# Patient Record
Sex: Female | Born: 1975 | Race: Black or African American | Hispanic: No | Smoking: Never smoker
Health system: Southern US, Community
[De-identification: ages and names within clinical notes are randomized; demographics above are authoritative.]

## PROBLEM LIST (undated history)

## (undated) DIAGNOSIS — K5790 Diverticulosis of intestine, part unspecified, without perforation or abscess without bleeding: Secondary | ICD-10-CM

## (undated) DIAGNOSIS — E162 Hypoglycemia, unspecified: Secondary | ICD-10-CM

## (undated) DIAGNOSIS — F988 Other specified behavioral and emotional disorders with onset usually occurring in childhood and adolescence: Secondary | ICD-10-CM

## (undated) DIAGNOSIS — N8003 Adenomyosis of the uterus: Secondary | ICD-10-CM

## (undated) DIAGNOSIS — K5792 Diverticulitis of intestine, part unspecified, without perforation or abscess without bleeding: Secondary | ICD-10-CM

## (undated) DIAGNOSIS — J45909 Unspecified asthma, uncomplicated: Secondary | ICD-10-CM

## (undated) DIAGNOSIS — D649 Anemia, unspecified: Secondary | ICD-10-CM

## (undated) DIAGNOSIS — K900 Celiac disease: Secondary | ICD-10-CM

## (undated) DIAGNOSIS — N83209 Unspecified ovarian cyst, unspecified side: Secondary | ICD-10-CM

## (undated) DIAGNOSIS — E282 Polycystic ovarian syndrome: Secondary | ICD-10-CM

## (undated) DIAGNOSIS — R011 Cardiac murmur, unspecified: Secondary | ICD-10-CM

## (undated) HISTORY — DX: Unspecified asthma, uncomplicated: J45.909

## (undated) HISTORY — DX: Unspecified ovarian cyst, unspecified side: N83.209

## (undated) HISTORY — PX: NO PAST SURGERIES: SHX2092

## (undated) HISTORY — DX: Anemia, unspecified: D64.9

## (undated) HISTORY — DX: Adenomyosis of the uterus: N80.03

## (undated) HISTORY — DX: Cardiac murmur, unspecified: R01.1

## (undated) HISTORY — DX: Celiac disease: K90.0

## (undated) HISTORY — DX: Diverticulosis of intestine, part unspecified, without perforation or abscess without bleeding: K57.90

## (undated) HISTORY — DX: Other specified behavioral and emotional disorders with onset usually occurring in childhood and adolescence: F98.8

## (undated) HISTORY — DX: Diverticulitis of intestine, part unspecified, without perforation or abscess without bleeding: K57.92

## (undated) HISTORY — DX: Polycystic ovarian syndrome: E28.2

## (undated) HISTORY — DX: Hypoglycemia, unspecified: E16.2

---

## 1999-05-09 ENCOUNTER — Encounter: Payer: Self-pay | Admitting: Geriatric Medicine

## 1999-05-09 ENCOUNTER — Encounter: Admission: RE | Admit: 1999-05-09 | Discharge: 1999-05-09 | Payer: Self-pay | Admitting: Geriatric Medicine

## 2000-05-08 ENCOUNTER — Other Ambulatory Visit: Admission: RE | Admit: 2000-05-08 | Discharge: 2000-05-08 | Payer: Self-pay | Admitting: Obstetrics and Gynecology

## 2002-03-03 ENCOUNTER — Other Ambulatory Visit: Admission: RE | Admit: 2002-03-03 | Discharge: 2002-03-03 | Payer: Self-pay | Admitting: *Deleted

## 2003-04-01 ENCOUNTER — Other Ambulatory Visit: Admission: RE | Admit: 2003-04-01 | Discharge: 2003-04-01 | Payer: Self-pay | Admitting: *Deleted

## 2004-05-18 ENCOUNTER — Inpatient Hospital Stay (HOSPITAL_COMMUNITY): Admission: AD | Admit: 2004-05-18 | Discharge: 2004-05-18 | Payer: Self-pay | Admitting: Pediatrics

## 2004-05-18 ENCOUNTER — Inpatient Hospital Stay (HOSPITAL_COMMUNITY): Admission: AD | Admit: 2004-05-18 | Discharge: 2004-05-21 | Payer: Self-pay | Admitting: Obstetrics and Gynecology

## 2004-05-19 ENCOUNTER — Encounter (INDEPENDENT_AMBULATORY_CARE_PROVIDER_SITE_OTHER): Payer: Self-pay | Admitting: Specialist

## 2004-05-22 ENCOUNTER — Encounter: Admission: RE | Admit: 2004-05-22 | Discharge: 2004-06-21 | Payer: Self-pay | Admitting: *Deleted

## 2005-08-24 ENCOUNTER — Encounter: Payer: Self-pay | Admitting: Family Medicine

## 2005-08-24 LAB — CONVERTED CEMR LAB: Pap Smear: NORMAL

## 2006-02-08 ENCOUNTER — Ambulatory Visit: Payer: Self-pay | Admitting: Family Medicine

## 2006-03-05 ENCOUNTER — Ambulatory Visit: Payer: Self-pay | Admitting: Pulmonary Disease

## 2006-05-24 ENCOUNTER — Ambulatory Visit: Payer: Self-pay | Admitting: Family Medicine

## 2006-11-26 ENCOUNTER — Encounter: Payer: Self-pay | Admitting: Family Medicine

## 2006-11-26 DIAGNOSIS — E669 Obesity, unspecified: Secondary | ICD-10-CM | POA: Insufficient documentation

## 2006-11-26 DIAGNOSIS — E785 Hyperlipidemia, unspecified: Secondary | ICD-10-CM | POA: Insufficient documentation

## 2006-11-26 DIAGNOSIS — E6609 Other obesity due to excess calories: Secondary | ICD-10-CM | POA: Insufficient documentation

## 2007-01-01 ENCOUNTER — Encounter (INDEPENDENT_AMBULATORY_CARE_PROVIDER_SITE_OTHER): Payer: Self-pay | Admitting: *Deleted

## 2007-04-11 ENCOUNTER — Telehealth (INDEPENDENT_AMBULATORY_CARE_PROVIDER_SITE_OTHER): Payer: Self-pay | Admitting: *Deleted

## 2007-04-11 ENCOUNTER — Ambulatory Visit: Payer: Self-pay | Admitting: Family Medicine

## 2007-04-15 LAB — CONVERTED CEMR LAB
ALT: 11 units/L (ref 0–35)
AST: 17 units/L (ref 0–37)
Albumin: 4.4 g/dL (ref 3.5–5.2)
Alkaline Phosphatase: 68 units/L (ref 39–117)
BUN: 11 mg/dL (ref 6–23)
Bilirubin, Direct: 0.2 mg/dL (ref 0.0–0.3)
CO2: 21 meq/L (ref 19–32)
Calcium: 9.4 mg/dL (ref 8.4–10.5)
Chloride: 104 meq/L (ref 96–112)
Cholesterol: 186 mg/dL (ref 0–200)
Creatinine, Ser: 1.02 mg/dL (ref 0.40–1.20)
Glucose, Bld: 76 mg/dL (ref 70–99)
HDL: 42 mg/dL (ref >39)
Indirect Bilirubin: 0.5 mg/dL (ref 0.0–0.9)
LDL Cholesterol: 133 mg/dL — ABNORMAL HIGH (ref 0–99)
Potassium: 4.3 meq/L (ref 3.5–5.3)
Sodium: 140 meq/L (ref 135–145)
Total Bilirubin: 0.7 mg/dL (ref 0.3–1.2)
Total CHOL/HDL Ratio: 4.4
Total Protein: 8.2 g/dL (ref 6.0–8.3)
Triglycerides: 57 mg/dL (ref <150)
VLDL: 11 mg/dL (ref 0–40)

## 2010-06-01 ENCOUNTER — Ambulatory Visit (INDEPENDENT_AMBULATORY_CARE_PROVIDER_SITE_OTHER): Payer: BC Managed Care – PPO | Admitting: Family Medicine

## 2010-06-01 ENCOUNTER — Encounter: Payer: Self-pay | Admitting: Family Medicine

## 2010-06-01 DIAGNOSIS — R0602 Shortness of breath: Secondary | ICD-10-CM

## 2010-06-02 ENCOUNTER — Ambulatory Visit: Payer: BC Managed Care – PPO | Admitting: Family Medicine

## 2010-06-06 NOTE — Assessment & Plan Note (Signed)
Summary: ASTHMA   Vital Signs:  Patient profile:   35 year old female Weight:      274.50 pounds BMI:     40.98 Temp:     98.8 degrees F oral Pulse rate:   74 / minute Pulse rhythm:   regular BP sitting:   126 / 70  (left arm) Cuff size:   large  Vitals Entered By: Maudie Mercury Dance CMA Deborra Medina) (June 01, 2010 10:23 AM)  Serial Vital Signs/Assessments:                                PEF    PreRx  PostRx Time      O2 Sat  O2 Type     L/min  L/min  L/min   By                               330                   Kim Dance CMA (AAMA)  CC: ? Asthma   History of Present Illness: CC: ? abnormal breathing  Sunday when working out zumba (pushed herself) noticed was having difficulty breathing.  had shortness of breath and trouble catching breath for 2-3 days afterwards.  Today better.  Never used inhaler in past.  never dx asthma in past but has been having issues for several years on and off now.  manages with deep breathing.  SOB noticed about 15-20 min into exercise.  No wheezing noticed.  having to breath extremely hard to get breath.  No coughing.  No fevers/chills.  No sick contacts at home.  No h/o allergies.  No eczema.  seen at [facility] for similar reason months ago and given some cough syrup?  worried about asthma.  has asked to be tested in past.  family hx asthma, mom dx age 86s.  daughter has eczema and sister has alleriges, eczema.  also with shellfish allergy described as itch, nausea/vomiting, and scratch in throat like "closing" but no swelling of soft tissues.  currently trying to get pregnant.  Current Medications (verified): 1)  Prenatal/iron  Tabs (Prenatal Multivit-Min-Fe-Fa)  Allergies (verified): 1)  ! Iodine 2)  ! * Shellfish 3)  * Flax Seed  Past History:  Past Medical History: Last updated: 11/26/2006 Hyperlipidemia  Past Surgical History: Last updated: 11/26/2006                 G2, P2, NSVD 2001         Holter Monitor (-)  Social History: Last updated:  11/26/2006 Never Smoked Alcohol use-yes, 1 drink q. 2 months Drug use-no Regular exercise-no Marital Status: Married x 5-1/2 years Children: 2, one with meningitis as infant, seizures Occupation: Technical sales engineer Diet:  3 meals, veggies, no FF, (+) H2O, no soda, no coffee/tea  Review of Systems       per HPI  Physical Exam  General:  Well-developed,well-nourished,in no acute distress; alert,appropriate and cooperative throughout examination, obese Head:  Normocephalic and atraumatic without obvious abnormalities. No apparent alopecia or balding. Eyes:  PERRLA, EOMI, no injection Ears:  TMs clear bilaterally Nose:  nares clear bilaterally Mouth:  MMM, no pharyngeal erythema Neck:  No deformities, masses, or tenderness noted. Lungs:  Normal respiratory effort, chest expands symmetrically. Lungs are clear to auscultation, no crackles or wheezes. Heart:  Normal rate and regular  rhythm. S1 and S2 normal without gallop, murmur, click, rub or other extra sounds. Pulses:  2+ rad pulses Extremities:  No clubbing, cyanosis, edema, or deformity noted with normal full range of motion of all joints.   Skin:  Intact without suspicious lesions or rashes   Impression & Recommendations:  Problem # 1:  DYSPNEA (ICD-786.05) lung exam normal today, however description consistent with EIB. no personal h/o atopy but does have fmhx.   PEF a bit low for her (expected 450 L/s) provided with script for alb inh, advise use 15-30 min prior to exhertion to see if improvement in sxs dyspnea. return when feeling better for spirometry.  Problem # 2:  Preventive Health Care (ICD-V70.0) provided with epipen script for shellfish alleryg.  Complete Medication List: 1)  Prenatal/iron Tabs (Prenatal multivit-min-fe-fa) 2)  Ventolin Hfa 108 (90 Base) Mcg/act Aers (Albuterol sulfate) .... 2 puffs q6 hours as needed shortness of breath 3)  Epipen 2-pak 0.3 Mg/0.89m Devi (Epinephrine) .... Use as  directed  Patient Instructions: 1)  peak flow a bit low today. 2)  return when you are feeling well for spirometry. 3)  given albuterol inhaler to use 15 min prior to exercise - 2 puffs, one each during slow deep inhalation then hold your breath for 10 sec. 4)  call [facility] questions.   Prescriptions: EPIPEN 2-PAK 0.3 MG/0.3ML DEVI (EPINEPHRINE) use as directed  #1 x 1   Entered and Authorized by:   JRia Bush MD   Signed by:   JRia Bush MD on 06/01/2010   Method used:   Electronically to        [id number] (retail)       [address]  [id number]      Ph: [phone]      Fax: [phone]  RxID:   [id number] HFA 108 (90 BASE) MCG/ACT AERS (ALBUTEROL SULFATE) 2 puffs q6 hours as needed shortness of breath  #1 x 1   Entered and Authorized by:   JRia Bush MD   Signed by:   JRia Bush MD on 06/01/2010   Method used:   Electronically to        [id number] (retail)       [address] [place]  [id number]      Ph: [phone]      Fax: [phone]  RxID:   [id number]   Orders Added: 1)  Est. Patient Level III [[40375]   Prior Medications: Current Allergies (reviewed today): ! IODINE ! * SHELLFISH * FLAX SEED

## 2010-07-07 ENCOUNTER — Encounter: Payer: Self-pay | Admitting: Family Medicine

## 2010-07-07 ENCOUNTER — Telehealth: Payer: Self-pay | Admitting: *Deleted

## 2010-07-07 NOTE — Telephone Encounter (Signed)
Patient notified. Note placed up front for pick up

## 2010-07-07 NOTE — Telephone Encounter (Signed)
Pt is a foster parent and she needs a letter for the state stating that she was given an inhaler on 3/8 to use when she exercises.  She needs this with [place] letterhead.

## 2010-07-07 NOTE — Telephone Encounter (Signed)
Done, given to [employer].

## 2010-08-11 NOTE — Assessment & Plan Note (Signed)
[facility]                             PULMONARY OFFICE NOTE   NAME:Brittany [name], Brittany [name]                        MRN:          [id number]  DATE:03/05/2006                            DOB:          02/09/1976    HISTORY OF PRESENT ILLNESS:  Patient is very pleasant 35 year old black  female, who I have been asked to see for possible sleep apnea.  The  patient states that she has had occasional episodes during the night  where she will wake up and feel like she is choking or cannot get her  breath.  She also feels that her heart is beating fast during that time.  She also notes that she has episodes of palpitations during the day as  well.  She states that her husband has noted loud snoring, but has never  commented on pauses in her breathing during sleep.  The patient  typically goes to bed between 9 and 11, and gets up between 5 a.m. and  6:30 a.m.  She feels that she has not rested upon arising.  However,  once she gets ready for work,  she typically feels much better.  She  works as a Technical sales engineer, and denies any sleep pressure during  the day.  She has no problems with alertness, and feels that her work  efficiency and concentration are adequate.  She has no sleepiness with  driving short or long distances, and only has an occasional dozing with  TV or movies in the evenings.  Of note, she states that her weight is  about 20 pounds down from 2 years ago.   PAST MEDICAL HISTORY:  Totally unremarkable.  She has no medical issues  and takes no medications.  She has no known drug allergies.   SOCIAL HISTORY:  She has never smoked.  She is married and has 2  children.   FAMILY HISTORY:  Remarkable for no specific diseases in 1st degree  relatives.   REVIEW OF SYSTEMS:  As per History of Present Illness, also see patient  intake form documented on chart.   PHYSICAL EXAMINATION:  In general, she is a morbidly obese black female  in no acute  distress.  Blood pressure is 114/74, pulse 90, temperature  98.7.  Weight is 328 pounds.  O2 saturation on room air is 99%.  HEENT:  Pupils equal, round, reactive to light and accommodation.  Extraocular muscles are intact.  Nares are patent with mild deviation to  the left.  Oropharynx shows mild elongation of soft palate and uvula  with very mild tonsillar hypertrophy.  NECK:  Supple without JVD or lymphadenopathy.  No palpable thyromegaly.  CHEST:  Totally clear.  CARDIAC:  Reveals regular rate and rhythm, no murmurs, rubs, or gallops.  ABDOMEN:  Soft, nontender, with good bowel sounds.  GENITAL, RECTAL, BREAST:  Exams not done and not indicated.  Lower extremities are without edema.  Good pulses distally.  No calf  tenderness.  NEURO: Alert and oriented with no obvious motor deficits.   IMPRESSION:  Questionable obstructive sleep apnea.  The patient  clearly  gives a history for loud snoring and occasional choking arousals.  She  is not totally rested in the mornings whenever she arises, but reports  no significant sleep pressure or alertness issues during the day.  She  appears very alert during her visit today.  I have told her that she may  or may not have sleep apnea, and that the only way to know for sure  would be to proceed with nocturnal polysomnography.  I have explained to  her that short term sleep apnea presents quality of life issues,  however, long term there are more significant cardiovascular  consequences.  At this point in time, because she is fairly asymptomatic  during the day, I do not think that it would be unreasonable to take 6  months and start working on weight loss and see how things go.  I think  one could also make a case for going ahead and proceeding with nocturnal  polysomnography to see if she has significant sleep disordered  breathing.  I do not have a preference, as long as the patient is going  to follow up with me fairly closely, and is honest  with herself about  her daytime symptoms.   PLAN:  The patient will take 6 months and work aggressively on weight  loss.  I will see her in followup at that time.  She will pay very close  attention to her symptoms over the next 6 months, and if she feels that  this is affecting her quality of life, we will go ahead and set up  nocturnal polysomnography.  Certainly, her palpitations at night could  be from sleep disordered breathing, however, this would not be case  during the day.  If the patient follows up in 6 months, and she has made  no progress on weight loss, she is willing to undergo nocturnal  polysomnography at that time to put the issue to rest.     Kathee Delton, MD,FCCP  Electronically Signed    [facility]/MedQ  DD: 03/05/2006  DT: 03/05/2006  Job #: [id number]   cc:   Eliezer Lofts, MD

## 2010-08-11 NOTE — H&P (Signed)
NAMEKARSTYN, BIRKEY                 ACCOUNT NO.:  [id number]   MEDICAL RECORD NO.:  [id number]          PATIENT TYPE:  INP   LOCATION:  9164                          FACILITY:  [facility]   PHYSICIAN:  Diona Foley, M.D.   DATE OF BIRTH:  [birth year]   DATE OF ADMISSION:  05/18/2004  DATE OF DISCHARGE:                                HISTORY & PHYSICAL   ADMISSION DIAGNOSIS:  Term intrauterine pregnancy, in spontaneous labor.   HISTORY OF PRESENT ILLNESS:  This is a 35 year old African-American female,  gravida 1, para 0, who presented at 39-6/7 weeks' gestation with complaints  of irregular contractions.  The patient was monitored and subsequently made  cervical change and spontaneously ruptured her membranes.  The patient's  pregnancy has been complicated by chronic hypertension, but she has not  required any medication.  Workup for preeclampsia has been negative  throughout her pregnancy.  Prenatal care has been at [facility] with  [place] B. Rosana Hoes, M.D., as the primary attending.  Upon admission, the  patient denied any headaches, visual changes or epigastric pain, reported  good fetal movement, denied any lost fluid or vaginal bleeding, but  complained of irregular contractions.   PRENATAL LABORATORY DATA:  Blood type is A positive, antibody screen  negative.  RPR nonreactive.  Rubella immune.  Hepatitis B surface antigen  nonreactive.  HIV nonreactive.  Pap test within normal limits.  Gonorrhea  and Chlamydia screens negative.  One-hour Glucola normal.  Group B beta  strep positive.  The patient declined triple test.  Anatomic survey at 20  weeks was normal.  Ultrasound performed at 35 weeks showed a viable  intrauterine pregnancy, estimated fetal weight at the 22nd percentile,  normal amniotic fluid index, biophysical profile at 8/8.  The patient had  undergone twice-weekly fetal surveillance throughout the third trimester and  has all been reassuring.   PAST MEDICAL HISTORY:  1.  Obesity.  2.  Hypertension.   PAST SURGICAL HISTORY:  Remote history of abnormal Pap smear.  No sexually-  transmitted infections.   PAST OBSTETRICAL HISTORY:  Gravida 1, para 0.   SOCIAL HISTORY:  Denies tobacco, alcohol or drugs.  Is married.   FAMILY HISTORY:  No known congenital anomalies, birth defects or mental  retardation, Down syndrome or other trisomies.  There is a remote history of  spina bifida in a second cousin.   PHYSICAL EXAMINATION:  VITAL SIGNS:  She is afebrile, vital signs are  stable.  GENERAL:  She is an obese black female who is in no acute distress.  CARDIAC:  Regular rate and rhythm.  CHEST:  Lungs are clear to auscultation bilaterally.  ABDOMEN:  Gravid, soft in between contractions, nontender.  Appears AGA.  EXTREMITIES:  Trace edema in the feet.  Deep tendon reflexes 2+ bilaterally.  There is no clonus.  PELVIC:  Cervix is 3 cm on admission, progressed to 5 cm, 100% effaced, 0  station, with spontaneous rupture of membranes.  MONITORING:  Fetal heart rate tracings 130s-140s with accelerations.  Tocometer contractions irregular, every two to five minutes.  ASSESSMENT:  A 35 year old gravida 1, para 0, black female at 74 weeks'  gestation by first trimester ultrasound, in spontaneous labor.   PLAN:  1.  Admit to labor and delivery.  2.  Continuous fetal monitoring.  3.  The patient has Stadol and Phenergan while needed in early labor or      epidural when in active labor.  4.  Start penicillin for group B beta strep prophylaxis.  5.  Anticipate attempts at vaginal delivery.      JW/MEDQ  D:  05/19/2004  T:  05/19/2004  Job:  [id number]

## 2010-08-11 NOTE — Assessment & Plan Note (Signed)
[facility]                             [place] OFFICE NOTE   NAME:Brittany [name]                        MRN:          [id number]  DATE:02/08/2006                            DOB:          06-29-1975    CHIEF COMPLAINT:  64 black female, here to establish new  doctor.   HISTORY OF PRESENT ILLNESS:  Brittany [name] has not seen a physician in a while.  She comes in today with multiple concerns, including the following.  1. Heart palpitations and paroxysmal nocturnal dyspnea:  Brittany [name] states      that for the past 3 to 4 years she has been waking at night with      episodes where she feels like her heart is beating unusually and      skipping a beat, and at the same time she has difficulty catching her      breath.  She states that she notices it happening on days when she is      more stressed out during the day, and then the episodes occur at night.      She denies any daytime symptoms.  She does snore at night, but she is      unsure as to whether her husband has noticed her stopping breathing at      night.  Her mother does have PVCs, and she feels that this may be what      is going on with her.  She has never had a sleep study.  2. Obesity:  She is interested in losing weight.  She is also concerned      about high cholesterol and diabetes, given her weight and her family      history.  She would like to be set up with a nutritionist if possible      to discuss healthy eating habits.  She was using beta HCG as a weight      loss technique for about a month and a half over the summer.  She did      notice some mild weight loss, but did not think that any of her      symptoms could be secondary to this.  3. Dizziness:  She states that she has had some occasional dizziness off      and on for the past year, lasting a few seconds.  She occasionally has      a sensation that it feels like she is shaking her head back and forth     rapidly.  This has only happened about 1 to 2 times.  She just wanted      to mention it, because she was concerned that it could be related to      something.  She denies any persistent dizziness, vertigo or syncopal      events.  She denies headache, weakness or numbness.   REVIEW OF SYSTEMS:  Otherwise negative.   PAST MEDICAL HISTORY:  1. Hypercholesterolemia.  2. Obesity.   HOSPITALIZATIONS, SURGERIES, PROCEDURES:  1. G2, P2.  2. A 2001  normal Holter monitor.  3. Pap smear in June 2007, negative.   ALLERGIES:  None.   MEDICATIONS:  None.   FAMILY HISTORY:  Father alive at age 29 with hypertension, mother alive at  age 66 with PVCs but no heart disorders.  There is no family history of  heart attack before age 12 except her paternal grandfather did have a heart  attack at age 54, which he died from.  She has one brother with hypertension  and high cholesterol.  She has three sisters, one who has ulcerative  colitis.  A paternal great-aunt had uterine cancer, and a third cousin had  ovarian cancer.  There is no breast or colon cancer in the family.   SOCIAL HISTORY:  She is a Technical sales engineer.  She has been married for 5-  1/2 years, and denies domestic abuse.  She has 2 children, one who is  currently sick with seizures that may be related to the meningitis that she  had as an infant.  She does not get any regular exercise.  She eats 3 meals  a day with vegetables, and tries to avoid fast foods, soda, and does not  drink any coffee and tea.  She mainly drinks water.  No tobacco use, one  alcoholic beverage every 2 months, no drug use.   PHYSICAL EXAMINATION:  Height 68-3/4 inches, weight 325 pounds, making BMI  around 39.  Blood pressure 124/94, pulse 80, temperature 98.8.  GENERAL:  Obese female in no apparent distress.  HEENT:  PERRLA.  Extraocular muscles are intact.  Oropharynx is clear.  Tympanic membranes are clear.  Nares are clear.  No thyromegaly, no   lymphadenopathy supraclavicular or cervical.  Wide neck, small oropharynx.  CARDIOVASCULAR:  Regular rate and rhythm, no murmurs, rubs or gallops.  Normal PMI, 2+ peripheral pulses, no peripheral edema.  LUNGS:  Clear to auscultation bilaterally, no wheezes, rales or rhonchi.  ABDOMEN:  Soft, nontender, normoactive bowel sounds, no hepatosplenomegaly,  although difficult to palpate secondary to obesity.  MUSCULOSKELETAL:  Strength 5/5 in upper and lower extremities.  NEUROLOGIC:  Cranial nerves II-XII grossly intact.  Alert and oriented x3.  Reflexes 2+.  Sensation intact in upper and lower extremities.   ASSESSMENT AND PLAN:  1. Episodes of heart arrhythmia plus shortness of breath at night:  An EKG      was done that was read as normal sinus rhythm, no evidence of PVCs or      arrhythmia.  There were flipped R and T waves in V1, V2 and V3, but      this is thought to be secondary to lead placement problem.  Her      symptoms at night could be secondary to anxiety.  Given the fact that      she has a large neck, obesity and snoring at night, I do have a      significant concern for possible sleep apnea.  I would begin her      evaluation with a referral to a pulmonologist for a complete sleep      study.  If this returns as normal, we can consider repeating another      Holter monitor or considering a queen of hearts monitor for a month to      determine what heart arrhythmia she is having at night.  I did      recommend to her to avoid caffeine and to practice good sleep hygiene.  2. Obesity:  We did discuss weight loss and exercise techniques in detail.      She will be given a referral to a nutritionist as long as her insurance      company will cover this.  She is considering paying out of pocket if      they do not.  She was also given a handful of information to read over     regarding healthy eating habits, eating on the run and beginning an      exercise program.  3.  Intermittent dizziness:  She will continue to follow this.  It has only      happened occasionally, and not persistently.  Her neurologic      examination is within normal limits today.  We can consider further      workup at a further time if it continues.  4. Prevention:  She gets her Pap smears at a gynecologist.  She is likely      due for tetanus,      which I will look for in her records.  She has received a flu vaccine      this year.  Given her obesity and family history for diabetes and high      cholesterol, we will check a cholesterol panel as well as a diabetes      screen.  She will return to clinic following the above.     Eliezer Lofts, MD  Electronically Signed    AB/MedQ  DD: 02/08/2006  DT: 02/09/2006  Job #: [id number]

## 2011-12-19 ENCOUNTER — Other Ambulatory Visit: Payer: Self-pay | Admitting: Gynecology

## 2011-12-26 ENCOUNTER — Encounter: Payer: Self-pay | Admitting: Gastroenterology

## 2012-01-10 ENCOUNTER — Encounter: Payer: Self-pay | Admitting: Gastroenterology

## 2012-01-17 ENCOUNTER — Ambulatory Visit: Payer: BC Managed Care – PPO | Admitting: Gastroenterology

## 2012-02-05 ENCOUNTER — Ambulatory Visit: Payer: BC Managed Care – PPO | Admitting: Gastroenterology

## 2012-03-17 ENCOUNTER — Encounter: Payer: Self-pay | Admitting: *Deleted

## 2012-03-25 ENCOUNTER — Ambulatory Visit: Payer: BC Managed Care – PPO | Admitting: Gastroenterology

## 2012-08-07 ENCOUNTER — Other Ambulatory Visit: Payer: Self-pay | Admitting: Obstetrics and Gynecology

## 2012-08-07 DIAGNOSIS — R928 Other abnormal and inconclusive findings on diagnostic imaging of breast: Secondary | ICD-10-CM

## 2012-08-08 ENCOUNTER — Other Ambulatory Visit: Payer: Self-pay | Admitting: Gynecology

## 2012-08-08 DIAGNOSIS — R928 Other abnormal and inconclusive findings on diagnostic imaging of breast: Secondary | ICD-10-CM

## 2012-08-11 ENCOUNTER — Ambulatory Visit
Admission: RE | Admit: 2012-08-11 | Discharge: 2012-08-11 | Disposition: A | Discharge status: Home / Self Care | Payer: BC Managed Care – PPO | Source: Ambulatory Visit | Attending: Obstetrics and Gynecology | Admitting: Obstetrics and Gynecology

## 2012-08-11 DIAGNOSIS — R928 Other abnormal and inconclusive findings on diagnostic imaging of breast: Secondary | ICD-10-CM

## 2013-01-26 ENCOUNTER — Ambulatory Visit: Payer: BC Managed Care – PPO | Admitting: Family Medicine

## 2013-09-10 ENCOUNTER — Ambulatory Visit (INDEPENDENT_AMBULATORY_CARE_PROVIDER_SITE_OTHER): Payer: BC Managed Care – PPO | Admitting: Psychology

## 2013-09-10 ENCOUNTER — Ambulatory Visit (INDEPENDENT_AMBULATORY_CARE_PROVIDER_SITE_OTHER): Payer: BC Managed Care – PPO | Admitting: Family Medicine

## 2013-09-10 VITALS — BP 120/80 | HR 72 | Temp 98.5°F | Wt 288.0 lb

## 2013-09-10 DIAGNOSIS — D649 Anemia, unspecified: Secondary | ICD-10-CM

## 2013-09-10 DIAGNOSIS — D509 Iron deficiency anemia, unspecified: Secondary | ICD-10-CM | POA: Insufficient documentation

## 2013-09-10 DIAGNOSIS — F988 Other specified behavioral and emotional disorders with onset usually occurring in childhood and adolescence: Secondary | ICD-10-CM | POA: Insufficient documentation

## 2013-09-10 DIAGNOSIS — D72819 Decreased white blood cell count, unspecified: Secondary | ICD-10-CM | POA: Insufficient documentation

## 2013-09-10 DIAGNOSIS — F4323 Adjustment disorder with mixed anxiety and depressed mood: Secondary | ICD-10-CM

## 2013-09-10 LAB — CBC WITH DIFFERENTIAL/PLATELET
BASOS PCT: 0.8 % (ref 0.0–3.0)
Basophils Absolute: 0 10*3/uL (ref 0.0–0.1)
EOS PCT: 0.9 % (ref 0.0–5.0)
Eosinophils Absolute: 0 10*3/uL (ref 0.0–0.7)
HCT: 37.9 % (ref 36.0–46.0)
Hemoglobin: 12.1 g/dL (ref 12.0–15.0)
Lymphocytes Relative: 31.3 % (ref 12.0–46.0)
Lymphs Abs: 1.6 10*3/uL (ref 0.7–4.0)
MCHC: 32 g/dL (ref 30.0–36.0)
MCV: 83 fl (ref 78.0–100.0)
MONOS PCT: 5.3 % (ref 3.0–12.0)
Monocytes Absolute: 0.3 10*3/uL (ref 0.1–1.0)
NEUTROS PCT: 61.7 % (ref 43.0–77.0)
Neutro Abs: 3.1 10*3/uL (ref 1.4–7.7)
PLATELETS: 242 10*3/uL (ref 150.0–400.0)
RBC: 4.57 Mil/uL (ref 3.87–5.11)
RDW: 14.2 % (ref 11.5–15.5)
WBC: 5.1 10*3/uL (ref 4.0–10.5)

## 2013-09-10 LAB — VITAMIN B12: Vitamin B-12: 401 pg/mL (ref 211–911)

## 2013-09-10 LAB — IBC PANEL
Iron: 79 ug/dL (ref 42–145)
Saturation Ratios: 21.6 % (ref 20.0–50.0)
Transferrin: 260.9 mg/dL (ref 212.0–360.0)

## 2013-09-10 MED ORDER — EPINEPHRINE 0.3 MG/0.3ML IJ SOAJ: 0.3000 mg | Freq: Once | INTRAMUSCULAR | Status: DC

## 2013-09-10 MED ORDER — ALBUTEROL SULFATE HFA 108 (90 BASE) MCG/ACT IN AERS: 2.0000 | INHALATION_SPRAY | Freq: Four times a day (QID) | RESPIRATORY_TRACT | Status: DC | PRN

## 2013-09-10 NOTE — Assessment & Plan Note (Signed)
Likely iron def, but eval iron stores and B12, re-eval Hg. Continue prenatal vit with iron for now.

## 2013-09-10 NOTE — Assessment & Plan Note (Signed)
Referral for ADD eval and possible ADD behavoiral treatment. She is hesitant about medication.

## 2013-09-10 NOTE — Patient Instructions (Signed)
Stop at front desk for referral for ADD eval. Stop at lab on way out.

## 2013-09-10 NOTE — Progress Notes (Signed)
Pre visit review using our clinic review tool, if applicable. No additional management support is needed unless otherwise documented below in the visit note. 

## 2013-09-10 NOTE — Progress Notes (Signed)
   Subjective:    Patient ID: Brittany [name], female    DOB: 1975/09/19, 38 y.o.   MRN: [id number]  HPI  38 year old female presents for discussion of recent labs.   She has been feeling well overall except she had asthma attack 1 week ago 6/7-09/01/2013. She feels trigger was allergies from bunny. She had chest congestion at the time. She went to urgent Care given albuterol inhaler given hers was expired. Prescribed steroids but she did not take. Janean Sark is now outside and her symptoms have resolved.  07/2013. She had wbc 3.8. Hg 10.9  She has been iron def anemia in the past. Currentlly on prenatal vit only few days a week.. She has started daily. No blood in urine, no blood in stool.  She feels she has heavier periods.. changes pad every 4-6 hours, last 4-5 days. She feels somewhat tired, but not very, no weakness, rare slight dizziness lasting seconds in last 6-7 month SOb with asthma, none otherwise.   TSH, CMET, lipids: nml.    She has concerns she has ADD. No previous diagnosis., NO hyperactivity. She has noted focusing issues, completing activities. She has trouble organizing at work. In school she was always kept desk disorganized. No depression,no anxiety. No SI.         Review of Systems     Objective:   Physical Exam  Constitutional: She is oriented to person, place, and time. Vital signs are normal. She appears well-developed and well-nourished. She is cooperative.  Non-toxic appearance. She does not appear ill. No distress.  Obese appearing female in NAD  HENT:  Head: Normocephalic.  Right Ear: Hearing, tympanic membrane, external ear and ear canal normal. Tympanic membrane is not erythematous, not retracted and not bulging.  Left Ear: Hearing, tympanic membrane, external ear and ear canal normal. Tympanic membrane is not erythematous, not retracted and not bulging.  Nose: No mucosal edema or rhinorrhea. Right sinus exhibits no maxillary sinus tenderness and no  frontal sinus tenderness. Left sinus exhibits no maxillary sinus tenderness and no frontal sinus tenderness.  Mouth/Throat: Uvula is midline, oropharynx is clear and moist and mucous membranes are normal.  Eyes: Conjunctivae, EOM and lids are normal. Pupils are equal, round, and reactive to light. Lids are everted and swept, no foreign bodies found.  Neck: Trachea normal and normal range of motion. Neck supple. Carotid bruit is not present. No mass and no thyromegaly present.  Cardiovascular: Normal rate, regular rhythm, S1 normal, S2 normal, normal heart sounds, intact distal pulses and normal pulses.  Exam reveals no gallop and no friction rub.   No murmur heard. Pulmonary/Chest: Effort normal and breath sounds normal. Not tachypneic. No respiratory distress. She has no decreased breath sounds. She has no wheezes. She has no rhonchi. She has no rales.  Abdominal: Soft. Normal appearance and bowel sounds are normal. There is no tenderness.  Neurological: She is alert and oriented to person, place, and time.  Skin: Skin is warm, dry and intact. No rash noted.  Psychiatric: Her speech is normal and behavior is normal. Judgment and thought content normal. Her mood appears not anxious. Cognition and memory are normal. She does not exhibit a depressed mood.          Assessment & Plan:

## 2013-09-10 NOTE — Addendum Note (Signed)
Addended by: Ellamae Sia on: 09/10/2013 11:25 AM   Modules accepted: Orders

## 2013-09-10 NOTE — Assessment & Plan Note (Signed)
Likely due to recent viral illness. Re-eval and add diff

## 2013-09-11 ENCOUNTER — Ambulatory Visit: Payer: BC Managed Care – PPO | Admitting: Family Medicine

## 2013-09-11 ENCOUNTER — Encounter: Payer: Self-pay | Admitting: *Deleted

## 2013-10-06 ENCOUNTER — Other Ambulatory Visit: Payer: Self-pay | Admitting: Gynecology

## 2013-10-15 ENCOUNTER — Ambulatory Visit: Payer: BC Managed Care – PPO | Admitting: Psychology

## 2013-12-25 ENCOUNTER — Telehealth: Payer: Self-pay | Admitting: Family Medicine

## 2013-12-25 ENCOUNTER — Ambulatory Visit (INDEPENDENT_AMBULATORY_CARE_PROVIDER_SITE_OTHER): Payer: BC Managed Care – PPO | Admitting: Psychology

## 2013-12-25 DIAGNOSIS — F909 Attention-deficit hyperactivity disorder, unspecified type: Secondary | ICD-10-CM

## 2013-12-25 DIAGNOSIS — F411 Generalized anxiety disorder: Secondary | ICD-10-CM

## 2013-12-25 NOTE — Telephone Encounter (Signed)
Pt would like to talk to you in reference to her ADD testing and ask about which medication she should be taking. The dr at [facility] in [place] says it will be another week before he completes everything and he will send you a note.  Could you please call patient? Thank you.

## 2013-12-25 NOTE — Telephone Encounter (Signed)
Discussed options with pt in detail on treatment of ADD. She will have test results faxed to [place] today. Please bring them to me once they come through.  We will start short acting adderall and follow up in 1 month once I review test results.

## 2013-12-28 ENCOUNTER — Encounter: Payer: Self-pay | Admitting: Family Medicine

## 2013-12-28 ENCOUNTER — Telehealth: Payer: Self-pay | Admitting: Family Medicine

## 2013-12-28 ENCOUNTER — Other Ambulatory Visit: Payer: BC Managed Care – PPO

## 2013-12-28 NOTE — Telephone Encounter (Signed)
Spoke with Brittany [name].  Advised we still have not gotten office notes yet from [facility]  She ask if she could come in today and sign paperwork.  I think she is talking about the Control Substance Contract.  Advised she could come in today and see the ladies in the lab to take care of that part.

## 2013-12-28 NOTE — Telephone Encounter (Signed)
Pt calling in today, aware Dr Diona Browner is out of the office today but she said she did not hear back on Friday about signing some forms. Pt needs to come in today to sign because its her only availability. Please advise. Thank you

## 2013-12-30 NOTE — Telephone Encounter (Signed)
Pt left v/m wanting to know if Dr Diona Browner received the form about pts meds and if so does pt need to come in to fill out form.

## 2013-12-31 NOTE — Telephone Encounter (Signed)
We cannot given her two prescriptions but we can given her 1 weeks worth of fast acting then have her come in for follow up in 1 week. If not going well we can adjust dose of try longer acting med.

## 2013-12-31 NOTE — Telephone Encounter (Signed)
Brittany [name] notified that currently we are waiting on the UDS results to come back.  Patient states she is very nervous about all of this.  She is wondering if she can be given two prescriptions, one being Adderall short acting and the other being Adderall long acting to try.  Then next month let Dr. Diona Browner know which one worked best for her.  Please advise.

## 2013-12-31 NOTE — Telephone Encounter (Signed)
At this point I have received records from [facility]. I am waiting on UDS results ( she did have that done when she came in to sign forms right?) We will plan on starting adderral Xl if she is agreeable.

## 2013-12-31 NOTE — Telephone Encounter (Signed)
Left message for Brittany [name] to return my call.

## 2014-01-01 MED ORDER — AMPHETAMINE-DEXTROAMPHETAMINE 5 MG PO TABS: 5.0000 mg | ORAL_TABLET | Freq: Every day | ORAL | Status: DC

## 2014-01-01 NOTE — Telephone Encounter (Addendum)
We will send in rx for thee short acting adderall, we will start low and slowly increase as needed for effect. HAve her follow up in 1-2 weeks.

## 2014-01-01 NOTE — Telephone Encounter (Signed)
Brittany [name] notified as instructed by telephone.  Still waiting on UDS results to come back before we can prescribed the Adderall.  Brittany [name] instructed we would be in touch once the UDS results come back.  Patient states understanding.

## 2014-01-01 NOTE — Addendum Note (Signed)
Addended by: Eliezer Lofts E on: 01/01/2014 04:05 PM   Modules accepted: Orders

## 2014-01-01 NOTE — Telephone Encounter (Signed)
[facility] notified as instructed by telephone.  Prescription left up front for her to pick up this afternoon.  She will schedule her follow up when she gets her to pick up her Rx.

## 2014-01-04 ENCOUNTER — Telehealth: Payer: Self-pay

## 2014-01-04 NOTE — Telephone Encounter (Signed)
Pt left v/m requesting status of Adderall prior auth; spoke with Langley Gauss at [employer] and will fax PA to [phone].

## 2014-01-04 NOTE — Telephone Encounter (Signed)
Prior Authorization for Adderall 5 mg approved.  Effective from 12/14/2013 thru 01/04/2015.  QHUT#[id number].  Brittany [name] and [facility] notified.

## 2014-01-06 ENCOUNTER — Telehealth: Payer: Self-pay | Admitting: Family Medicine

## 2014-01-06 NOTE — Telephone Encounter (Signed)
[place] notified that the Adderall 5 mg that Dr. Diona Browner prescribed in short acting.

## 2014-01-06 NOTE — Telephone Encounter (Signed)
Pt called and wanted to know if amphetamine-dextroamphetamine (ADDERALL) 5 MG tablet was Extended Release.  Best number to call pt is [phone]

## 2014-01-12 ENCOUNTER — Encounter: Payer: Self-pay | Admitting: Family Medicine

## 2014-01-15 ENCOUNTER — Ambulatory Visit (INDEPENDENT_AMBULATORY_CARE_PROVIDER_SITE_OTHER): Payer: BC Managed Care – PPO | Admitting: Family Medicine

## 2014-01-15 ENCOUNTER — Encounter: Payer: Self-pay | Admitting: Family Medicine

## 2014-01-15 VITALS — BP 110/80 | HR 69 | Temp 98.5°F | Ht 68.0 in | Wt 283.8 lb

## 2014-01-15 DIAGNOSIS — F909 Attention-deficit hyperactivity disorder, unspecified type: Secondary | ICD-10-CM

## 2014-01-15 DIAGNOSIS — F988 Other specified behavioral and emotional disorders with onset usually occurring in childhood and adolescence: Secondary | ICD-10-CM

## 2014-01-15 DIAGNOSIS — M25561 Pain in right knee: Secondary | ICD-10-CM

## 2014-01-15 MED ORDER — DICLOFENAC SODIUM 75 MG PO TBEC: 75.0000 mg | DELAYED_RELEASE_TABLET | Freq: Two times a day (BID) | ORAL | Status: DC

## 2014-01-15 MED ORDER — AMPHETAMINE-DEXTROAMPHETAMINE 5 MG PO TABS: ORAL_TABLET | ORAL | Status: DC

## 2014-01-15 NOTE — Patient Instructions (Addendum)
Increase Adderall to 10 mg in AM and take 5 mg in afternoon around 12-2  PM.  Follow up in 1 month for ADD check.  Start diclofenac twice daily for pain and inflammation in knee.  Ice. Gentle range of motion stretches  Folow up if ot improving in 2 weeks.

## 2014-01-15 NOTE — Progress Notes (Signed)
   Subjective:    Patient ID: Brittany [name], female    DOB: June 20, 1975, 38 y.o.   MRN: [id number]  HPI  38 year old female presents for 1 month follow up ADD.  She has started short acting Adderall in last 8 days. She has noted some improved concentration, focus.  She feels like it is not helping in afternoon as it is short acting, she wants to try it in afternoon as well.  No significant SE. No unexpected weight loss. No palpitations. No insomnia. No chest pain.   Wt Readings from Last 3 Encounters:  01/15/14 283 lb 12 oz (128.708 kg)  09/10/13 288 lb (130.636 kg)  06/01/10 274 lb 8 oz (124.512 kg)   BP Readings from Last 3 Encounters:  01/15/14 110/80  09/10/13 120/80  06/01/10 126/70     Right knee pain x 24 hours. Occurred after kneeling. When stood up she could not put weight on it. Icing it, staying off leg today. No pain at rest but pain with walking and bending.  Hx of knee problems, no surgery., old basketball injury.  Review of Systems  Constitutional: Negative for fever and fatigue.  HENT: Negative for ear pain.   Eyes: Negative for pain.  Respiratory: Negative for chest tightness and shortness of breath.   Cardiovascular: Negative for chest pain, palpitations and leg swelling.  Gastrointestinal: Negative for abdominal pain.  Genitourinary: Negative for dysuria.       Objective:   Physical Exam  Constitutional: Vital signs are normal. She appears well-developed and well-nourished. She is cooperative.  Non-toxic appearance. She does not appear ill. No distress.  HENT:  Head: Normocephalic.  Right Ear: Hearing, tympanic membrane, external ear and ear canal normal. Tympanic membrane is not erythematous, not retracted and not bulging.  Left Ear: Hearing, tympanic membrane, external ear and ear canal normal. Tympanic membrane is not erythematous, not retracted and not bulging.  Nose: No mucosal edema or rhinorrhea. Right sinus exhibits no maxillary sinus  tenderness and no frontal sinus tenderness. Left sinus exhibits no maxillary sinus tenderness and no frontal sinus tenderness.  Mouth/Throat: Uvula is midline, oropharynx is clear and moist and mucous membranes are normal.  Eyes: Conjunctivae, EOM and lids are normal. Pupils are equal, round, and reactive to light. Lids are everted and swept, no foreign bodies found.  Neck: Trachea normal and normal range of motion. Neck supple. Carotid bruit is not present. No thyroid mass and no thyromegaly present.  Cardiovascular: Normal rate, regular rhythm, S1 normal, S2 normal, normal heart sounds, intact distal pulses and normal pulses.  Exam reveals no gallop and no friction rub.   No murmur heard. Pulmonary/Chest: Effort normal and breath sounds normal. No tachypnea. No respiratory distress. She has no decreased breath sounds. She has no wheezes. She has no rhonchi. She has no rales.  Abdominal: Soft. Normal appearance and bowel sounds are normal. There is no tenderness.  Musculoskeletal:       Right knee: She exhibits decreased range of motion. She exhibits no swelling, no effusion and no bony tenderness. Tenderness found. No MCL and no patellar tendon tenderness noted.  Neurological: She is alert.  Skin: Skin is warm, dry and intact. No rash noted.  Psychiatric: Her speech is normal and behavior is normal. Judgment and thought content normal. Her mood appears not anxious. Cognition and memory are normal. She does not exhibit a depressed mood.          Assessment & Plan:

## 2014-01-15 NOTE — Progress Notes (Signed)
Pre visit review using our clinic review tool, if applicable. No additional management support is needed unless otherwise documented below in the visit note. 

## 2014-01-21 ENCOUNTER — Telehealth: Payer: Self-pay

## 2014-01-21 NOTE — Telephone Encounter (Signed)
Pt takinig Adderall for 2 weeks; 01/20/14 when pt woke up during the night pt felt like rapid heart beat but actual pulse was 66 and ? some skipping of beat. Pt was not sure if feeling was due to anxiety or a true irregular beat; pts husband did not feel irreg heartbeat. Pt has hx of feeling irreg beat when stressed and pt is under a lot of stress now. 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 Pt does not have another Zumba class until 01/23/14. Pt did not do work out or take Adderall this morning. Now pt feels normal. This morning heart rate between 60-70. This not the first time pt has had these symptoms or feelings.[facility].Pt request cb. If pt condition changes or worsens prior to cb pt will call office.

## 2014-01-22 NOTE — Telephone Encounter (Signed)
Brittany [name] notified as instructed by telephone.  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  She has not taken any Adderall the last two days and her heart rate has been fine.  I advised to go back down to the Adderall 5 mg daily and then slowly increase it back to two a day.  If her symptoms continue she will  make an appointment to be evaluated.

## 2014-01-22 NOTE — Telephone Encounter (Signed)
If she had increased the adderall as instructed at last OV, have her return to the lower dose. Make appt for eval if symptoms continuing.

## 2014-02-05 ENCOUNTER — Other Ambulatory Visit: Payer: Self-pay | Admitting: Family Medicine

## 2014-02-05 NOTE — Telephone Encounter (Signed)
Last office visit 10/232/2015.  Last refilled 01/15/2014 for #30 with no refills.  Ok to refill?

## 2014-02-11 DIAGNOSIS — M25561 Pain in right knee: Secondary | ICD-10-CM | POA: Insufficient documentation

## 2014-02-11 NOTE — Assessment & Plan Note (Signed)
Start diclofenac twice daily for pain and inflammation in knee.  Ice. Gentle range of motion stretches  Folow up if ot improving in 2 weeks.

## 2014-02-11 NOTE — Assessment & Plan Note (Signed)
Trial of short acting adderall BID.

## 2014-02-15 ENCOUNTER — Telehealth: Payer: Self-pay

## 2014-02-15 NOTE — Telephone Encounter (Signed)
Pt left vm requesting cb; left v/m requesting cb from pt.

## 2014-02-19 ENCOUNTER — Encounter: Payer: Self-pay | Admitting: Family Medicine

## 2014-02-19 ENCOUNTER — Ambulatory Visit: Payer: BC Managed Care – PPO | Admitting: Family Medicine

## 2014-02-19 ENCOUNTER — Ambulatory Visit (INDEPENDENT_AMBULATORY_CARE_PROVIDER_SITE_OTHER): Payer: BC Managed Care – PPO | Admitting: Family Medicine

## 2014-02-19 VITALS — BP 124/78 | HR 76 | Temp 98.6°F | Ht 68.0 in

## 2014-02-19 DIAGNOSIS — F909 Attention-deficit hyperactivity disorder, unspecified type: Secondary | ICD-10-CM

## 2014-02-19 DIAGNOSIS — F988 Other specified behavioral and emotional disorders with onset usually occurring in childhood and adolescence: Secondary | ICD-10-CM

## 2014-02-19 DIAGNOSIS — R002 Palpitations: Secondary | ICD-10-CM

## 2014-02-19 NOTE — Progress Notes (Signed)
   Dr. Frederico Hamman T. Jeni Duling, MD, [facility] [place], [address] Phone: [phone] Fax: [phone]  02/19/2014  Patient: Brittany [name], MRN: [id number], DOB: 1975-09-20, 38 y.o.  Primary Physician:  Eliezer Lofts, MD  Chief Complaint: Palpitations  Subjective:   Brittany [name] is a 38 y.o. very pleasant female patient who presents with the following:  Started some heart palpitations, feels like heart is skipping a beat. Mostly at night.   Very pleasant young lady who has been having some intermittent palpitations over the last week or so.  These primarily occur at nighttime, and she will feel like her heart is skipping beats repetitively.  It is not associated with exertion.  Over the last 5-7 days, the patient has discontinued her Adderall, and these sensations have persisted.  Per her report to me, the patient also does have had some intermittent anxiety for some time.  This also feels similar to these sensations.  She also will have a warm sensation in her head.  She is not having chest pain.  She denies any illicit drug use.  Past Medical History, Surgical History, Social History, Family History, Problem List, Medications, and Allergies have been reviewed and updated if relevant.   GEN: No acute illnesses, no fevers, chills. GI: No n/v/d, eating normally Pulm: No SOB Interactive and getting along well at home.  Otherwise, ROS is as per the HPI.  Objective:   BP 124/78 mmHg  Pulse 76  Temp(Src) 98.6 F (37 C) (Oral)  Ht 5' 8"  (1.727 m)  Wt   SpO2 98%  LMP 02/05/2014  GEN: WDWN, NAD, Non-toxic, A & O x 3 HEENT: Atraumatic, Normocephalic. Neck supple. No masses, No LAD. Ears and Nose: No external deformity. CV: RRR, No M/G/R. No JVD. No thrill. No extra heart sounds. PULM: CTA B, no wheezes, crackles, rhonchi. No retractions. No resp. distress. No accessory muscle use. EXTR: No c/c/e NEURO Normal gait.  PSYCH:  Normally interactive. Conversant. Not depressed or anxious appearing.  Calm demeanor.   Laboratory and Imaging Data:  Assessment and Plan:   Palpitations - Plan: Holter monitor - 48 hour, EKG 12-Lead, EKG 12-Lead, TSH, CANCELED: TSH  ADD (attention deficit disorder)  EKG: Normal sinus rhythm. Normal axis, normal R wave progression, No acute ST elevation or depression.   Check thyroid-stimulating hormone and we will arrange for a 48 hour Holter monitor.  If this is negative, she may need some experimentation with her ADD medication, such as one of the longer acting medications or potentially Strattera.

## 2014-02-19 NOTE — Progress Notes (Deleted)
Pre visit review using our clinic review tool, if applicable. No additional management support is needed unless otherwise documented below in the visit note. 

## 2014-02-20 LAB — TSH: TSH: 1.219 u[IU]/mL (ref 0.350–4.500)

## 2014-02-24 ENCOUNTER — Ambulatory Visit: Payer: BC Managed Care – PPO | Admitting: Family Medicine

## 2014-02-25 ENCOUNTER — Ambulatory Visit (INDEPENDENT_AMBULATORY_CARE_PROVIDER_SITE_OTHER): Payer: BC Managed Care – PPO | Admitting: *Deleted

## 2014-02-25 DIAGNOSIS — R002 Palpitations: Secondary | ICD-10-CM

## 2014-03-05 ENCOUNTER — Telehealth: Payer: Self-pay

## 2014-03-05 NOTE — Telephone Encounter (Signed)
Pt left v/m requesting cb when holter monitor results are available.

## 2014-03-05 NOTE — Telephone Encounter (Signed)
We will let her know when they come in - for some reason this type of test takes longer for reading / results than maybe any other

## 2014-03-05 NOTE — Telephone Encounter (Signed)
pt seen 02/19/14.

## 2014-03-05 NOTE — Telephone Encounter (Signed)
Holter monitor results still in process. Pt seen and Holter ordered by Dr. Loletha Grayer, will await his recs once they return.

## 2014-03-08 ENCOUNTER — Telehealth: Payer: Self-pay | Admitting: Family Medicine

## 2014-03-08 NOTE — Telephone Encounter (Signed)
Brittany [name] notified that her holter monitor results are not back yet.  Advised this test takes a while to get results back but we will call her as soon as we have her results.

## 2014-03-08 NOTE — Telephone Encounter (Signed)
Pt calling in and wants the results of her heart monitor reading. Thank you!

## 2014-03-08 NOTE — Telephone Encounter (Signed)
Expand All Collapse All   We will let her know when they come in - for some reason this type of test takes longer for reading / results than maybe any other      We are dependent on Cardiology reading her holter. Don't worry. We will call when it comes in.  Electronically Signed  By: Owens Loffler, MD On: 03/08/2014 9:40 AM

## 2014-03-08 NOTE — Telephone Encounter (Signed)
Brittany [name] notified her results are not back yet, but we will call her with those results once we have them.

## 2014-03-16 NOTE — Telephone Encounter (Addendum)
Called [facility] to follow up on Holter Monitor results.  They state they are sitting in one of the Dr's, box to be read, who is out of the office this week, so it will probably be next week sometime before the results will be available.  Brittany [name] notified it would probably be next weeks before we have her results.

## 2014-03-16 NOTE — Telephone Encounter (Signed)
Pt left v/m requesting status of heart monitor results.

## 2014-03-16 NOTE — Telephone Encounter (Signed)
Would you mind calling [facility] and asking the status of her holter?  Please let the patient know again that I have absolutely no control over the speed with which her test is read - we are checking on it and will let her know when we have a result.

## 2014-03-24 ENCOUNTER — Telehealth: Payer: Self-pay | Admitting: *Deleted

## 2014-03-24 NOTE — Telephone Encounter (Signed)
Informed patient of holter results  NSR with rare PVC  Nighttime bradycardia, first degree av block  Narrow complex tachycardia vs svt

## 2014-03-25 ENCOUNTER — Telehealth: Payer: Self-pay | Admitting: Family Medicine

## 2014-03-25 NOTE — Telephone Encounter (Signed)
I spoke to the patient. I don't have her Holter results. There is minimal that I can say about her results without seeing the results for myself and seeing the holter report. There is an Therapist, sports note from Cardiology, but there are only a few listed terms without any context. Will discuss with patient when I have the actual 48 hour holter.

## 2014-03-25 NOTE — Telephone Encounter (Signed)
Pt called she has questions about her lab work that dr copland ordered.  She had this labs done several weeks ago.  They called her yesterday  Pt didn't know who called it was just from [place]

## 2014-03-25 NOTE — Telephone Encounter (Signed)
I spoke with pt, she said she was called yesterday with holter monitor results, but she didn't understand them. She was told that results would be sent to our office, so she called here for clarification. Please advise?

## 2014-03-31 ENCOUNTER — Telehealth: Payer: Self-pay | Admitting: Family Medicine

## 2014-03-31 ENCOUNTER — Ambulatory Visit (INDEPENDENT_AMBULATORY_CARE_PROVIDER_SITE_OTHER): Payer: BC Managed Care – PPO

## 2014-03-31 DIAGNOSIS — R000 Tachycardia, unspecified: Secondary | ICD-10-CM

## 2014-03-31 DIAGNOSIS — R002 Palpitations: Secondary | ICD-10-CM

## 2014-03-31 NOTE — Telephone Encounter (Signed)
I reviewed the patient's Holter monitor with her. Normal sinus rhythm with PVCs. She also had some nighttime bradycardia and first-degree AV block.  Additionally the patient had approximately 9% of her total beats and tachycardia, with narrow complex tachycardia. She feels these, and is symptomatic, and she is also taking Adderall for ADD medication.  I discussed all this with the patient in detail, and I think that it is best if she discusses this in further detail with cardiology to determine the exact type of narrow complex tachycardia and if this needs to be treated, or if her ADD medication should be altered to be a nonstimulant type.  Electronically Signed  By: Owens Loffler, MD On: 03/31/2014 5:40 PM

## 2014-04-13 ENCOUNTER — Other Ambulatory Visit: Payer: Self-pay

## 2014-04-14 LAB — CYTOLOGY - PAP

## 2014-04-16 ENCOUNTER — Ambulatory Visit: Payer: BC Managed Care – PPO | Admitting: Cardiovascular Disease

## 2014-04-28 ENCOUNTER — Telehealth: Payer: Self-pay | Admitting: Family Medicine

## 2014-04-28 DIAGNOSIS — R002 Palpitations: Secondary | ICD-10-CM

## 2014-04-28 NOTE — Telephone Encounter (Signed)
Pt had appointment with dr Fletcher Anon on 1/22 cancel because of weather Wants to go to [facility] in [place] Pt needs afternoon appointment after 3 if possible Pt would like [place] to schedule for her

## 2014-04-28 NOTE — Telephone Encounter (Signed)
Dr. Diona Browner, Can you please put in new cardiology referral for [place]? Previous referral cancelled due to inclement weather   Thanks, Ebony Hail

## 2014-04-30 NOTE — Telephone Encounter (Signed)
Done

## 2014-05-03 ENCOUNTER — Ambulatory Visit: Payer: BC Managed Care – PPO | Admitting: Cardiology

## 2014-05-24 ENCOUNTER — Encounter: Payer: Self-pay | Admitting: Cardiovascular Disease

## 2014-05-24 ENCOUNTER — Ambulatory Visit: Payer: BC Managed Care – PPO | Admitting: Cardiovascular Disease

## 2014-05-24 ENCOUNTER — Ambulatory Visit (INDEPENDENT_AMBULATORY_CARE_PROVIDER_SITE_OTHER): Payer: BC Managed Care – PPO | Admitting: Cardiovascular Disease

## 2014-05-24 VITALS — BP 110/80 | HR 64 | Ht 69.0 in | Wt 292.4 lb

## 2014-05-24 DIAGNOSIS — R002 Palpitations: Secondary | ICD-10-CM

## 2014-05-24 NOTE — Progress Notes (Signed)
Patient ID: Brittany [name], female   DOB: Nov 09, 1975, 39 y.o.   MRN: [id number]     Cardiology Office Note   Date:  05/24/2014   ID:  Brittany [name], DOB [birth year], MRN [id number]  PCP:  Eliezer Lofts, MD  Cardiologist:   Jenkins Rouge, MD   Chief Complaint  Patient presents with  . OTHER    NP, abnormal EKG      History of Present Illness: Brittany [name] is a 39 y.o. female who presents for evaluation of palpitations  Holter done in December Reviewed Occasional PVC.  Some prolongation of PR and sinus slowing at night physiologic  At least one capture of SVTrate over 150 narrow complex She was on Adderall and this was stopped doing better now with just occasional palpitations at night.  She has a hard time focusing and felt she needed to be on something.  No previous cardiac issues  Has lowered caffeine and green tea. No excess ETOH or drugs.      Past Medical History  Diagnosis Date  . Asthma   . Hyperlipemia     No past surgical history on file.   Current Outpatient Prescriptions  Medication Sig Dispense Refill  . albuterol (PROVENTIL HFA;VENTOLIN HFA) 108 (90 BASE) MCG/ACT inhaler Inhale 2 puffs into the lungs every 6 (six) hours as needed. 3.7 g 1  . EPINEPHrine (EPIPEN 2-PAK) 0.3 mg/0.3 mL IJ SOAJ injection Inject 0.3 mLs (0.3 mg total) into the muscle once. 2 Device 0  . OVER THE COUNTER MEDICATION Take 2-3 tablets by mouth daily. SUPER CLEANSE     No current facility-administered medications for this visit.    Allergies:   Iodine and Shellfish allergy    Social History:  The patient  reports that she has never smoked. She has never used smokeless tobacco. She reports that she drinks alcohol. She reports that she does not use illicit drugs.   Family History:  The patient's family history includes Colon cancer in an other family member; Ulcerative colitis in her sister.    ROS:  Please see the history of present illness.   Otherwise, review of systems are positive for  none.   All other systems are reviewed and negative.    PHYSICAL EXAM: VS:  BP 110/80 mmHg  Pulse 64  Ht 5' 9"  (1.753 m)  Wt 132.632 kg (292 lb 6.4 oz)  BMI 43.16 kg/m2  SpO2 99%  LMP 05/21/2014 , BMI Body mass index is 43.16 kg/(m^2). GEN: Well nourished, well developed, in no acute distress HEENT: normal Neck: no JVD, carotid bruits, or masses Cardiac:  RRR; no murmurs, rubs, or gallops,no edema  Respiratory:  clear to auscultation bilaterally, normal work of breathing GI: soft, nontender, nondistended, + BS MS: no deformity or atrophy Skin: warm and dry, no rash Neuro:  Strength and sensation are intact Psych: euthymic mood, full affect   EKG:   02/19/14  SR rate 69  Normal ECG    Recent Labs: 09/10/2013: Hemoglobin 12.1; Platelets 242.0 02/19/2014: TSH 1.219    Lipid Panel    Component Value Date/Time   CHOL 186 04/11/2007 2229   TRIG 57 04/11/2007 2229   HDL 42 04/11/2007 2229   CHOLHDL 4.4 Ratio 04/11/2007 2229   VLDL 11 04/11/2007 2229   LDLCALC 133* 04/11/2007 2229      Wt Readings from Last 3 Encounters:  05/24/14 132.632 kg (292 lb 6.4 oz)  01/15/14 128.708 kg (283 lb 12 oz)  09/10/13 130.636  kg (288 lb)      Other studies Reviewed: Additional studies/ records that were reviewed today include: Epic / Primary care notes Hoter monitor2.    ASSESSMENT AND PLAN:  1.  Palpitations  Benign worsened by stimulating effects of Adderral  F/U echo to r/o structural heart disease.   2. ADD:  Consider non stimulating Rx with Starttera or other meds  Avoid Adderral 3. Obesity:  Discussed exercise and low carb diet  Current medicines are reviewed at length with the patient today.  The patient has concerns regarding medicines.  The following changes have been made:  no change  Labs/ tests ordered today include: Echo   No orders of the defined types were placed in this encounter.     Disposition:   FU with  Me PRN  i    Signed, Jenkins Rouge, MD    05/24/2014 4:20 PM    [facility] [place], [place], [place]  [address] Phone: [phone]; Fax: [phone]

## 2014-05-24 NOTE — Patient Instructions (Signed)
Your physician recommends that you schedule a follow-up appointment in: AS NEEDED  Your physician recommends that you continue on your current medications as directed. Please refer to the Current Medication list given to you today.   Your physician has requested that you have an echocardiogram. Echocardiography is a painless test that uses sound waves to create images of your heart. It provides your doctor with information about the size and shape of your heart and how well your heart's chambers and valves are working. This procedure takes approximately one hour. There are no restrictions for this procedure.

## 2014-05-27 ENCOUNTER — Ambulatory Visit (HOSPITAL_COMMUNITY): Payer: BC Managed Care – PPO | Attending: Internal Medicine

## 2014-05-27 DIAGNOSIS — R002 Palpitations: Secondary | ICD-10-CM

## 2014-05-27 NOTE — Progress Notes (Signed)
2D Echo completed. 05/27/2014 

## 2014-05-31 ENCOUNTER — Telehealth: Payer: Self-pay | Admitting: Cardiovascular Disease

## 2014-05-31 NOTE — Telephone Encounter (Signed)
New Msg         Pt calling to get Echo results.    Please return call.

## 2014-05-31 NOTE — Telephone Encounter (Signed)
Pt made aware of results no questions at this time.  Pt still having palpitations at night but much better than before. Pt instructed to call our office if she has any questions in the future.

## 2014-07-28 ENCOUNTER — Telehealth: Payer: Self-pay

## 2014-07-28 NOTE — Telephone Encounter (Signed)
Pt wants to know if Dr Diona Browner does IUR procedure to assist in having children without medications; if not can Dr Diona Browner recommend someone who does procedure without meds; pt wants natural cycle. Pt request cb.

## 2014-07-29 NOTE — Telephone Encounter (Signed)
Alfie notified by telephone that Dr. Diona Browner does not do the IUR procedure.  Given Dr. Binnie Rail name in [facility] to call.  Message also routed to [facility] for input.

## 2014-07-29 NOTE — Telephone Encounter (Signed)
I do not do this procedure.  Rosaria Ferries; are you aware of anyone? Sounds like she needs a fertility Dr.

## 2014-08-03 NOTE — Telephone Encounter (Signed)
Pt called back and wanted to know if she and her husband decided to use donor sperm since pts husband sperm count was low would Dr Diona Browner fill out paperwork or would Dr Rolin Barry; advised pt to contact Dr Remi Haggard office for that. Najwa will wait to hear from pt care coordinator also.

## 2014-09-22 ENCOUNTER — Other Ambulatory Visit: Payer: Self-pay | Admitting: Gynecology

## 2014-09-23 LAB — CYTOLOGY - PAP

## 2014-11-08 DIAGNOSIS — Z0279 Encounter for issue of other medical certificate: Secondary | ICD-10-CM

## 2014-11-09 ENCOUNTER — Telehealth: Payer: Self-pay | Admitting: Family Medicine

## 2014-11-09 NOTE — Telephone Encounter (Signed)
Pt dropped off form for medication evaluation for foster care. Form in Donna's in box.

## 2014-11-09 NOTE — Telephone Encounter (Signed)
Ziana notified form is ready to be picked up at the front desk.

## 2014-11-09 NOTE — Telephone Encounter (Signed)
In outbox

## 2014-11-09 NOTE — Telephone Encounter (Signed)
DSS form placed in Dr. Rometta Emery in box for completion.

## 2014-11-17 ENCOUNTER — Encounter: Payer: Self-pay | Admitting: Obstetrics and Gynecology

## 2014-11-26 ENCOUNTER — Encounter: Payer: Self-pay | Admitting: Family Medicine

## 2014-11-26 ENCOUNTER — Ambulatory Visit (INDEPENDENT_AMBULATORY_CARE_PROVIDER_SITE_OTHER): Payer: BC Managed Care – PPO | Admitting: Family Medicine

## 2014-11-26 DIAGNOSIS — E282 Polycystic ovarian syndrome: Secondary | ICD-10-CM | POA: Diagnosis not present

## 2014-11-26 NOTE — Patient Instructions (Addendum)
Stop at front desk to set up with nutritionist. Work on healthy eating, decrease sweets. Look into [employer].

## 2014-11-26 NOTE — Progress Notes (Signed)
   Subjective:    Patient ID: Brittany [name], female    DOB: 08/19/1975, 39 y.o.   MRN: [id number]  HPI  39 year old female presents with concerns about recent lab work done at fertility center for treatment of infertility.  She has PCOS. She reports that GYN eval of PCOS had insulin level at 7. She had thought this was an A1C. The other labs form 08/2014 she has today show  A1C 5.1 and glucose 91.  Last years glucose at GYN was 74.    Wt Readings from Last 3 Encounters:  11/26/14 302 lb 4 oz (137.1 kg)  05/24/14 292 lb 6.4 oz (132.632 kg)  01/15/14 283 lb 12 oz (128.708 kg)   Body mass index is 44.61 kg/(m^2).     Review of Systems  Constitutional: Negative for fever and fatigue.  HENT: Negative for ear pain.   Eyes: Negative for pain.  Respiratory: Negative for chest tightness and shortness of breath.   Cardiovascular: Negative for chest pain, palpitations and leg swelling.  Gastrointestinal: Negative for abdominal pain.  Genitourinary: Negative for dysuria.       Objective:   Physical Exam  Constitutional: She is oriented to person, place, and time. She appears well-developed and well-nourished.  Morbidly obese  HENT:  Head: Normocephalic.  Cardiovascular: Normal rate.   Pulmonary/Chest: Effort normal.  Abdominal: There is no tenderness.  Neurological: She is alert and oriented to person, place, and time.  Psychiatric:  Tearful about lab tests as she had thought they were abnormal.          Assessment & Plan:

## 2014-11-26 NOTE — Assessment & Plan Note (Signed)
Discussed diagnosis, metformin to treat and increase infertility as well as answered questions for pt.

## 2014-11-26 NOTE — Assessment & Plan Note (Signed)
Pt reassured no obesity, but she is at high risk with family history of DM and her morbid  obesity. Counseled on lifestyle changes, discussed meds for weight loss. Refer to nutritionist.

## 2014-11-26 NOTE — Progress Notes (Signed)
Pre visit review using our clinic review tool, if applicable. No additional management support is needed unless otherwise documented below in the visit note. 

## 2014-12-16 ENCOUNTER — Encounter: Payer: Self-pay | Admitting: Obstetrics and Gynecology

## 2014-12-30 ENCOUNTER — Telehealth: Payer: Self-pay | Admitting: Family Medicine

## 2014-12-30 NOTE — Telephone Encounter (Signed)
Form completed and placed in Dr. Rometta Emery in box for signature.

## 2014-12-30 NOTE — Telephone Encounter (Signed)
Patient faxed [facility].  Patient said form needs to be filled out by this afternoon, so she can mail it to the state by Friday. Form is on Donna's desk.

## 2015-04-08 ENCOUNTER — Telehealth: Payer: Self-pay | Admitting: Family Medicine

## 2015-04-08 NOTE — Telephone Encounter (Signed)
[facility]  Patient Name: Brittany [name]  DOB: [birth year]    Initial Comment Caller states her blood pressure is a little elevated. She wanted to know if the [employer] blood pressure readings were reliable?   Nurse Assessment  Nurse: Doyle Askew, RN, Joelene Millin Date/Time Eilene Ghazi Time): 04/08/2015 3:43:38 PM  Confirm and document reason for call. If symptomatic, describe symptoms. You must click the next button to save text entered. ---Caller states her blood pressure is a little elevated. She wanted to know if the [employer] blood pressure readings were reliable? Pt will go to mom's house to use their machine. bp is 152/97. Pain in chest that pt believes is gas. In center of chest and comes and goes. Pain is uncomfortable but pain isn't really bad. No SOB.  Has the patient traveled out of the country within the last 30 days? ---Not Applicable  Does the patient have any new or worsening symptoms? ---Yes  Will a triage be completed? ---Yes  Related visit to physician within the last 2 weeks? ---No  Does the PT have any chronic conditions? (i.e. diabetes, asthma, etc.) ---Yes  List chronic conditions. ---asthma, progesterone (for pregnancy),  Did the patient indicate they were pregnant? ---Yes  How many weeks gestation? ---6.5 weeks  Have you felt decreased fetal movement? ---Early Pregnancy - No Fetal Movement Felt Yet  Is this a behavioral health or substance abuse call? ---No     Guidelines    Guideline Title Affirmed Question Affirmed Notes  Chest Pain [1] Chest pain lasting <= 5 minutes AND [2] NO chest pain or cardiac symptoms now (Exceptions: pains lasting a few seconds)    Final Disposition User   See Physician within [place], RN, Joelene Millin    Comments  Pt is driving home from work at this time and trying to take pulse and is getting from 156 to 188. Pt is going to go to mom's house to check bp and  try to relax and nurse will call pt back. Pt stated understanding  nurse called pt back and bp is now 126/88 and pulse is around 120. No chest pain at this time. Pt just took 2 tums. Pain is upper chest close to larynx. Pain just last a few seconds at this time. Pt asked if she could go and exercise tomorrow before work and nurse advised pt to wait and get clearance from MD visit tomorrow before doing this. Pt stated understanding. Pt does not have bp issues that she knows of.   Referrals  GO TO FACILITY UNDECIDED   Disagree/Comply: Comply

## 2015-04-09 ENCOUNTER — Ambulatory Visit (INDEPENDENT_AMBULATORY_CARE_PROVIDER_SITE_OTHER): Payer: BC Managed Care – PPO | Admitting: Family Medicine

## 2015-04-09 ENCOUNTER — Encounter: Payer: Self-pay | Admitting: Family Medicine

## 2015-04-09 VITALS — BP 118/84 | HR 75 | Temp 98.4°F | Wt 286.0 lb

## 2015-04-09 DIAGNOSIS — K219 Gastro-esophageal reflux disease without esophagitis: Secondary | ICD-10-CM | POA: Diagnosis not present

## 2015-04-09 MED ORDER — ALBUTEROL SULFATE HFA 108 (90 BASE) MCG/ACT IN AERS: 2.0000 | INHALATION_SPRAY | Freq: Four times a day (QID) | RESPIRATORY_TRACT | Status: DC | PRN

## 2015-04-09 MED ORDER — EPINEPHRINE 0.3 MG/0.3ML IJ SOAJ: 0.3000 mg | Freq: Once | INTRAMUSCULAR | Status: DC

## 2015-04-09 NOTE — Patient Instructions (Addendum)
Vitals and exam are re assuring today  Take tums as needed for heartburn  Eat regular small meals and keep up with your fluids If symptoms return- alert [place] and get to the hospital if needed  If short of breath/palpitations/leg swelling-also let us know   The cuff I like is OMRON for the arm (not wrist)   Follow up with obgyn as planned

## 2015-04-09 NOTE — Progress Notes (Signed)
Pre visit review using our clinic review tool, if applicable. No additional management support is needed unless otherwise documented below in the visit note. 

## 2015-04-09 NOTE — Progress Notes (Signed)
Subjective:    Patient ID: Brittany [name], female    DOB: 1975-12-08, 40 y.o.   MRN: [id number]  HPI Yesterday had elevated bp during some chest pain  bp was 132G systolic  Her pulse was high as well  Was feeling nervous   Drove to her parent's house - check - bp multiple times and it was nl  Pulse 75-80 and bp 106 /74 for example  ? 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 Morbid obesity ([place]) 11/26/2014  . PCOS (polycystic ovarian syndrome) 11/26/2014  . Right knee pain 02/11/2014  . ADD (attention deficit disorder) 09/10/2013  . Anemia 09/10/2013  . Leukocytopenia, unspecified 09/10/2013  . DYSPNEA 06/01/2010  . HYPERLIPIDEMIA 11/26/2006  . OBESITY 11/26/2006   Past Medical History  Diagnosis Date  . Asthma   . Hyperlipemia    No past surgical history on file. Social History  Substance Use Topics  . Smoking status: Never Smoker   . Smokeless tobacco: Never Used  . Alcohol Use: 0.0 oz/week    0 Standard drinks or equivalent per week     Comment: 1 drink every 2 months    Family History  Problem Relation Age of Onset  . Ulcerative colitis Sister     x 3   . Colon cancer     Allergies  Allergen Reactions  . Iodine     REACTION: Itching in throat  . Shellfish Allergy     REACTION: GI issues; itching   Current Outpatient Prescriptions on File Prior to Visit  Medication Sig Dispense Refill  . albuterol (PROVENTIL HFA;VENTOLIN  HFA) 108 (90 BASE) MCG/ACT inhaler Inhale 2 puffs into the lungs every 6 (six) hours as needed. 3.7 g 1  . EPINEPHrine (EPIPEN 2-PAK) 0.3 mg/0.3 mL IJ SOAJ injection Inject 0.3 mLs (0.3 mg total) into the muscle once. 2 Device 0  . naproxen sodium (ANAPROX) 550 MG tablet Reported on 04/09/2015  0  . OVER THE COUNTER MEDICATION Take 2-3 tablets by mouth daily. Reported on 04/09/2015     No current facility-administered medications on file prior to visit.   Review of Systems Review of Systems  Constitutional: Negative for fever, appetite change, fatigue and unexpected weight change.  ENT neg for throat pain or post nasal drip  Eyes: Negative for pain and visual disturbance.  Respiratory: Negative for cough and shortness of breath.  neg for wheezing  Cardiovascular: Negative for  palpitations   neg for sob on exertion or leg swelling  Gastrointestinal: Negative for , diarrhea and constipation. Pos for nausea and epigastric burning   Genitourinary: Negative for urgency and frequency.  Skin: Negative for pallor or rash   Neurological: Negative for weakness, light-headedness, numbness and headaches.  Hematological: Negative for adenopathy. Does not bruise/bleed easily.  Psychiatric/Behavioral: Negative for dysphoric mood. The patient is  not nervous/anxious.         Objective:   Physical Exam  Constitutional: She appears well-developed and well-nourished. No distress.  obese and well appearing   HENT:  Head: Normocephalic and atraumatic.  Mouth/Throat: Oropharynx is clear and moist.  Eyes: Conjunctivae and EOM are normal. Pupils are equal, round, and reactive to light. No scleral icterus.  Neck: Normal range of motion. Neck supple.  Cardiovascular: Normal rate, regular rhythm and normal heart sounds.   Pulmonary/Chest: Effort normal and breath sounds normal. No respiratory distress. She has no wheezes. She has no rales.  Abdominal: Soft. Bowel sounds are normal. She exhibits no distension  and no mass. There is no tenderness. There is no rebound and no guarding.  Musculoskeletal: She exhibits no edema or tenderness.  Lymphadenopathy:    She has no cervical adenopathy.  Neurological: She is alert.  Skin: Skin is warm and dry. No erythema. No pallor.  Psychiatric: She has a normal mood and affect.          Assessment & Plan:   Problem List Items Addressed This Visit      Digestive   Acid reflux - Primary    In pt approx [redacted] wk pregnant  Having intermittent chest burning and nausea helped by tums Feels fine now  Disc imp of regular small meals and fluids  Had an episode of panic- vitals stabilized quickly after that  Nl exam today   Update if not starting to improve in a week or if worsening

## 2015-04-09 NOTE — Assessment & Plan Note (Addendum)
In pt approx [redacted] wk pregnant  Having intermittent chest burning and nausea helped by tums Feels fine now  Disc imp of regular small meals and fluids  Had an episode of panic- vitals stabilized quickly after that  Nl exam today   Update if not starting to improve in a week or if worsening

## 2015-04-11 NOTE — Telephone Encounter (Signed)
Pt was seen at [facility] 04/09/15.

## 2015-04-15 ENCOUNTER — Telehealth: Payer: Self-pay | Admitting: Family Medicine

## 2015-04-15 NOTE — Telephone Encounter (Signed)
Patient Name: Brittany [name] DOB: 1976-02-21 Initial Comment Caller states she is about [redacted] weeks pregnant. She is having cramping. Worried about miscarriage. Nurse Assessment Nurse: Ronnald Ramp, RN, Miranda Date/Time (Eastern Time): 04/15/2015 9:59:39 AM Confirm and document reason for call. If symptomatic, describe symptoms. You must click the next button to save text entered. ---Caller states she is pregnant and has been having cramping off and on in her left side for weeks and this week she has been having sharp pain off and on in the right side of her abdomen. Denies vaginal bleeding. She is on progesterone prescribed by fertility specialists. Has the patient traveled out of the country within the last 30 days? ---Not Applicable Does the patient have any new or worsening symptoms? ---Yes Will a triage be completed? ---Yes Related visit to physician within the last 2 weeks? ---No Does the PT have any chronic conditions? (i.e. diabetes, asthma, etc.) ---Yes List chronic conditions. ---Asthma Did the patient indicate they were pregnant? ---Yes How many weeks gestation? ---7-8 weeks Have you felt decreased fetal movement? ---Early Pregnancy - No Fetal Movement Felt Yet Is this a behavioral health or substance abuse call? ---No Guidelines Guideline Title Affirmed Question Affirmed Notes Pregnancy - Abdominal Pain Less Than [redacted] Weeks EGA [1] Intermittent lower abdominal pain (e.g., cramping) AND [2] present > 24 hours Final Disposition User See Physician within 24 Hours Jones, RN, Miranda Comments Told caller she needed to contact her fertility specialist or her high risk OB group. Referrals GO TO FACILITY OTHER - SPECIFY Disagree/Comply: Comply

## 2015-04-15 NOTE — Telephone Encounter (Signed)
Agree.. Need to contact GYN or fertility specialist or seen at [facility].

## 2015-06-13 ENCOUNTER — Other Ambulatory Visit: Payer: Self-pay | Admitting: Family Medicine

## 2015-06-17 ENCOUNTER — Encounter: Payer: Self-pay | Admitting: Family Medicine

## 2015-06-17 ENCOUNTER — Ambulatory Visit (INDEPENDENT_AMBULATORY_CARE_PROVIDER_SITE_OTHER): Payer: BC Managed Care – PPO | Admitting: Family Medicine

## 2015-06-17 VITALS — BP 102/72 | HR 88 | Temp 98.7°F

## 2015-06-17 DIAGNOSIS — M25561 Pain in right knee: Secondary | ICD-10-CM

## 2015-06-17 DIAGNOSIS — J029 Acute pharyngitis, unspecified: Secondary | ICD-10-CM

## 2015-06-17 MED ORDER — DICLOFENAC SODIUM 75 MG PO TBEC: 75.0000 mg | DELAYED_RELEASE_TABLET | Freq: Two times a day (BID) | ORAL | Status: DC

## 2015-06-17 NOTE — Assessment & Plan Note (Signed)
Neg strep test, symptomatic care.

## 2015-06-17 NOTE — Progress Notes (Signed)
Subjective:    Patient ID: Brittany [name], female    DOB: 1975/09/07, 40 y.o.   MRN: [id number]  Sore Throat  This is a new problem. The current episode started in the past 7 days. The problem has been gradually improving. The pain is worse on the right side. There has been no fever. The pain is at a severity of 7/10 (sore to touch only now, less swelling). The pain is moderate. Associated symptoms include swollen glands. Pertinent negatives include no abdominal pain, congestion, coughing, ear discharge, ear pain, headaches, hoarse voice, plugged ear sensation, neck pain, shortness of breath or trouble swallowing. She has had exposure to strep. She has had no exposure to mono. She has tried nothing for the symptoms.   Needs refill of diclofenac for right knee pain.   Sick contact: strep at school, she is a Pharmacist, hospital. Social History /Family History/Past Medical History reviewed and updated if needed.   Review of Systems  HENT: Negative for congestion, ear discharge, ear pain, hoarse voice and trouble swallowing.   Respiratory: Negative for cough and shortness of breath.   Gastrointestinal: Negative for abdominal pain.  Musculoskeletal: Negative for neck pain.  Neurological: Negative for headaches.       Objective:   Physical Exam  Constitutional: Vital signs are normal. She appears well-developed and well-nourished. She is cooperative.  Non-toxic appearance. She does not appear ill. No distress.  HENT:  Head: Normocephalic.  Right Ear: Hearing, tympanic membrane, external ear and ear canal normal. Tympanic membrane is not erythematous, not retracted and not bulging. No middle ear effusion.  Left Ear: Hearing, tympanic membrane, external ear and ear canal normal. Tympanic membrane is not erythematous, not retracted and not bulging.  No middle ear effusion.  Nose: Mucosal edema and rhinorrhea present. Right sinus exhibits no maxillary sinus tenderness and no frontal sinus tenderness. Left  sinus exhibits no maxillary sinus tenderness and no frontal sinus tenderness.  Mouth/Throat: Uvula is midline and mucous membranes are normal. Posterior oropharyngeal erythema present. No posterior oropharyngeal edema.  Eyes: Conjunctivae, EOM and lids are normal. Pupils are equal, round, and reactive to light. Lids are everted and swept, no foreign bodies found.  Neck: Trachea normal and normal range of motion. Neck supple. Carotid bruit is not present. No thyroid mass and no thyromegaly present.  Cardiovascular: Normal rate, regular rhythm, S1 normal, S2 normal, normal heart sounds, intact distal pulses and normal pulses.  Exam reveals no gallop and no friction rub.   No murmur heard. Pulmonary/Chest: Effort normal and breath sounds normal. No tachypnea. No respiratory distress. She has no decreased breath sounds. She has no wheezes. She has no rhonchi. She has no rales.  Musculoskeletal:       Right knee: She exhibits decreased range of motion. She exhibits no bony tenderness and normal meniscus. No tenderness found. No medial joint line, no lateral joint line, no MCL, no LCL and no patellar tendon tenderness noted.       Left knee: She exhibits normal range of motion. No tenderness found.  Lymphadenopathy:    She has cervical adenopathy.       Right cervical: Superficial cervical adenopathy present.  Neurological: She is alert.  Skin: Skin is warm, dry and intact. No rash noted.  Psychiatric: Her speech is normal and behavior is normal. Judgment normal. Her mood appears not anxious. Cognition and memory are normal. She does not exhibit a depressed mood.          Assessment &  Plan:

## 2015-06-17 NOTE — Patient Instructions (Addendum)
Can use diclofenac for knee pain as needed.  Call for appt if not improving as expected.  Rest, fluids, tylenol for sore throat as needed.

## 2015-06-17 NOTE — Assessment & Plan Note (Signed)
Refilled diclofenac for pain. Work on [employer] loss and strengthening exercises. Marland Kitchen

## 2015-06-17 NOTE — Progress Notes (Signed)
Pre visit review using our clinic review tool, if applicable. No additional management support is needed unless otherwise documented below in the visit note. 

## 2015-06-17 NOTE — Addendum Note (Signed)
Addended byEliezer Lofts E on: 06/17/2015 05:20 PM   Modules accepted: SmartSet

## 2015-10-12 ENCOUNTER — Ambulatory Visit (INDEPENDENT_AMBULATORY_CARE_PROVIDER_SITE_OTHER): Payer: BC Managed Care – PPO | Admitting: Family Medicine

## 2015-10-12 ENCOUNTER — Encounter: Payer: Self-pay | Admitting: Family Medicine

## 2015-10-12 VITALS — BP 110/74 | HR 68 | Temp 98.9°F | Ht 69.0 in | Wt 285.5 lb

## 2015-10-12 DIAGNOSIS — J4541 Moderate persistent asthma with (acute) exacerbation: Secondary | ICD-10-CM | POA: Diagnosis not present

## 2015-10-12 MED ORDER — BECLOMETHASONE DIPROPIONATE 40 MCG/ACT IN AERS: 2.0000 | INHALATION_SPRAY | Freq: Two times a day (BID) | RESPIRATORY_TRACT | Status: DC

## 2015-10-12 NOTE — Progress Notes (Signed)
Dr. Frederico Hamman T. Mechel Haggard, MD, [facility] [place], [address] Phone: [phone] Fax: [phone]  10/12/2015  Patient: Brittany [name], MRN: [id number], DOB: 23-Jun-1975, 40 y.o.  Primary Physician:  Eliezer Lofts, MD   Chief Complaint  Patient presents with  . Asthma   Subjective:   Brittany [name] is a 40 y.o. very pleasant female patient who presents with the following:  410 peak flow 470 is expected for height and age.  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  She is using her albuterol at least every 4 hours, sometimes more than this and she is been doing so for more than a month.  Even before that she was using her about albuterol multiple times a day hoping to get her asthma under control.  She is not actively wheezing.  She has never been on any maintenance therapy.  Past Medical History, Surgical History, Social History, Family History, Problem List, Medications, and Allergies have been reviewed and updated if relevant.  Patient Active Problem List   Diagnosis Date Noted  . Viral pharyngitis 06/17/2015  . Acid reflux 04/09/2015  . Morbid obesity ([place]) 11/26/2014  . PCOS (polycystic ovarian syndrome) 11/26/2014  . Right knee pain 02/11/2014  . ADD (attention deficit disorder) 09/10/2013  . Anemia 09/10/2013  . Leukocytopenia, unspecified 09/10/2013  . DYSPNEA 06/01/2010  . HYPERLIPIDEMIA 11/26/2006  . OBESITY 11/26/2006    Past Medical History  Diagnosis Date  . Asthma   . Hyperlipemia     No past surgical history on file.  Social History   Social History  . Marital Status: Married    Spouse Name: N/A  . Number of Children: 2  . Years of Education: N/A   Occupational History  . Not on file.   Social History Main Topics  . Smoking status: Never Smoker   . Smokeless  tobacco: Never Used  . Alcohol Use: 0.0 oz/week    0 Standard drinks or equivalent per week     Comment: 1 drink every 2 months   . Drug Use: No  . Sexual Activity: Not on file   Other Topics Concern  . Not on file   Social History Narrative    Family History  Problem Relation Age of Onset  . Ulcerative colitis Sister     x 3   . Colon cancer      Allergies  Allergen Reactions  . Iodine     REACTION: Itching in throat  . Shellfish Allergy     REACTION: GI issues; itching  . Other Nausea And Vomiting    Flax seed     Medication list reviewed and updated in full in [place].   GEN: No acute illnesses, no fevers, chills. GI: No n/v/d, eating normally Pulm: No SOB Interactive and getting along well at home.  Otherwise, ROS is as per the HPI.  Objective:   BP 110/74 mmHg  Pulse 68  Temp(Src) 98.9 F (37.2 C) (Oral)  Ht 5' 9"  (1.753 m)  Wt 285 lb 8 oz (129.502 kg)  BMI 42.14 kg/m2  SpO2 98%  GEN: WDWN, NAD, Non-toxic, A & O x 3 HEENT: Atraumatic, Normocephalic. Neck supple. No masses, No LAD. Ears and Nose: No external deformity. CV: RRR, No M/G/R. No JVD. No thrill. No extra heart  sounds. PULM: CTA B, no wheezes, crackles, rhonchi. No retractions. No resp. distress. No accessory muscle use. EXTR: No c/c/e NEURO Normal gait.  PSYCH: Normally interactive. Conversant. Not depressed or anxious appearing.  Calm demeanor.   Laboratory and Imaging Data:  Assessment and Plan:   Asthma with exacerbation, moderate persistent  Poorly controlled asthma.  Not actively wheezing currently, but subjective shortness of breath and diminished peak flow compared to predicted value.  Recommend maintenance corticosteroid inhaler, b.i.d.  She was also given a prescription for a spacer.  Asked the patient to follow-up in 3 weeks if she is still not improving with her medication changes.  I also explained that albuterol use is supposed to be rare in nature and not  certainly to be used every 4 hours for an extended period of time, which is a clear bad sign.  Follow-up: Dr. Jacinto Reap if needed  New Prescriptions   BECLOMETHASONE (QVAR) 40 MCG/ACT INHALER    Inhale 2 puffs into the lungs 2 (two) times daily.   Signed,  Maud Deed. Mane Consolo, MD   Patient's Medications  New Prescriptions   BECLOMETHASONE (QVAR) 40 MCG/ACT INHALER    Inhale 2 puffs into the lungs 2 (two) times daily.  Previous Medications   ALBUTEROL (PROVENTIL HFA;VENTOLIN HFA) 108 (90 BASE) MCG/ACT INHALER    Inhale 2 puffs into the lungs every 6 (six) hours as needed.   DICLOFENAC (VOLTAREN) 75 MG EC TABLET    Take 1 tablet (75 mg total) by mouth 2 (two) times daily.   EPIPEN 2-PAK 0.3 MG/0.3ML SOAJ INJECTION    INJECT AS DIRECTED   METFORMIN (GLUCOPHAGE) 1000 MG TABLET    Take 1,000 mg by mouth 2 (two) times daily.   NAPROXEN SODIUM (ANAPROX) 550 MG TABLET    Reported on 04/09/2015  Modified Medications   No medications on file  Discontinued Medications   OVER THE COUNTER MEDICATION    Take 2-3 tablets by mouth daily. Reported on 04/09/2015

## 2015-10-12 NOTE — Progress Notes (Signed)
Pre visit review using our clinic review tool, if applicable. No additional management support is needed unless otherwise documented below in the visit note. 

## 2015-10-20 ENCOUNTER — Ambulatory Visit: Payer: BC Managed Care – PPO | Admitting: Family Medicine

## 2015-10-24 ENCOUNTER — Ambulatory Visit (INDEPENDENT_AMBULATORY_CARE_PROVIDER_SITE_OTHER): Payer: BC Managed Care – PPO | Admitting: Family Medicine

## 2015-10-24 DIAGNOSIS — J4540 Moderate persistent asthma, uncomplicated: Secondary | ICD-10-CM

## 2015-10-24 NOTE — Assessment & Plan Note (Signed)
Improved control on Qvar. Recommended taking BID. Use a;lbuterol only for resuce.  Given allergy component of asthma.. Start zyrtec. Follow up in 2 months for spirometry on Qvar.

## 2015-10-24 NOTE — Progress Notes (Signed)
   Subjective:    Patient ID: Brittany [name], female    DOB: 03-Nov-1975, 40 y.o.   MRN: [id number]  HPI  40 year old female presents for follow up poorly controlled asthma. Seen but Dr. Wylene Simmer on 7/19.  Started on maintenance corticosteroid.. Qvar.  Given albuterol inhaler and spacer  Today she reports her breathing improved on Qvar. She was only on Qvar once daily for a week... Already noted some improvement. Did not realize she was supposed to take it twice daily. Lost it few days ago.. Now SOB with exercise  And at rest is returning.  She has gone down to 1-2 times  Day albuterol use. She is able to do zumba much more daily.  She has been having central ache in chest, off and on for x 2 weeks. Occurred before and after Qvar. No mucus. No fever.  She is nonsmoker.  Social History /Family History/Past Medical History reviewed and updated if needed.     Review of Systems  Constitutional: Negative for fatigue.  HENT: Negative for ear pain.   Eyes: Negative for pain.  Respiratory: Positive for cough, shortness of breath and wheezing.   Cardiovascular: Positive for chest pain. Negative for palpitations and leg swelling.       Objective:   Physical Exam  Constitutional: Vital signs are normal. She appears well-developed and well-nourished. She is cooperative.  Non-toxic appearance. She does not appear ill. No distress.  HENT:  Head: Normocephalic.  Right Ear: Hearing, tympanic membrane, external ear and ear canal normal. Tympanic membrane is not erythematous, not retracted and not bulging.  Left Ear: Hearing, tympanic membrane, external ear and ear canal normal. Tympanic membrane is not erythematous, not retracted and not bulging.  Nose: No mucosal edema or rhinorrhea. Right sinus exhibits no maxillary sinus tenderness and no frontal sinus tenderness. Left sinus exhibits no maxillary sinus tenderness and no frontal sinus tenderness.  Mouth/Throat: Uvula is midline, oropharynx is  clear and moist and mucous membranes are normal.  Eyes: Conjunctivae, EOM and lids are normal. Pupils are equal, round, and reactive to light. Lids are everted and swept, no foreign bodies found.  Neck: Trachea normal and normal range of motion. Neck supple. Carotid bruit is not present. No thyroid mass and no thyromegaly present.  Cardiovascular: Normal rate, regular rhythm, S1 normal, S2 normal, normal heart sounds, intact distal pulses and normal pulses.  Exam reveals no gallop and no friction rub.   No murmur heard. Pulmonary/Chest: Effort normal and breath sounds normal. No tachypnea. No respiratory distress. She has no decreased breath sounds. She has no wheezes. She has no rhonchi. She has no rales.  Abdominal: Soft. Normal appearance and bowel sounds are normal. There is no tenderness.  Neurological: She is alert.  Skin: Skin is warm, dry and intact. No rash noted.  Psychiatric: Her speech is normal and behavior is normal. Judgment and thought content normal. Her mood appears not anxious. Cognition and memory are normal. She does not exhibit a depressed mood.          Assessment & Plan:

## 2015-10-24 NOTE — Progress Notes (Signed)
Pre visit review using our clinic review tool, if applicable. No additional management support is needed unless otherwise documented below in the visit note. 

## 2015-10-24 NOTE — Patient Instructions (Addendum)
Find the Qvar. Start back on Qvar TWICE daily. Start zyrtec at bedtime.

## 2015-12-23 ENCOUNTER — Ambulatory Visit: Payer: BC Managed Care – PPO | Admitting: Family Medicine

## 2015-12-30 ENCOUNTER — Encounter: Payer: Self-pay | Admitting: Family Medicine

## 2015-12-30 ENCOUNTER — Ambulatory Visit (INDEPENDENT_AMBULATORY_CARE_PROVIDER_SITE_OTHER): Payer: BC Managed Care – PPO | Admitting: Family Medicine

## 2015-12-30 VITALS — BP 110/78 | HR 68 | Temp 98.8°F | Ht 69.0 in | Wt 276.2 lb

## 2015-12-30 DIAGNOSIS — J45902 Unspecified asthma with status asthmaticus: Secondary | ICD-10-CM

## 2015-12-30 DIAGNOSIS — J4540 Moderate persistent asthma, uncomplicated: Secondary | ICD-10-CM

## 2015-12-30 MED ORDER — ALBUTEROL SULFATE (2.5 MG/3ML) 0.083% IN NEBU: 2.5000 mg | INHALATION_SOLUTION | Freq: Four times a day (QID) | RESPIRATORY_TRACT | 1 refills | Status: DC | PRN

## 2015-12-30 NOTE — Patient Instructions (Signed)
Contnnue current meds.  Consider adding zyrtec.

## 2015-12-30 NOTE — Progress Notes (Signed)
Pre visit review using our clinic review tool, if applicable. No additional management support is needed unless otherwise documented below in the visit note. 

## 2015-12-30 NOTE — Progress Notes (Signed)
ASTHMA   Now on Qvar 2 puffs BID, has not yet started zyrtec Symptoms are well controlled: Occ worsened by mold and or stress. Using medications without problems: No SE Night time symptoms: None, sleeping well Wheeze/SOB: using 1-2 times a weekg albuterol  ER visits since last visit: None Missed work or school: None Allergens:   Yes   Spirometry today is normal!  PMH and SH reviewed  Meds, vitals, and allergies reviewed.   ROS: Per HPI unless specifically indicated in ROS section   GEN: nad, alert and oriented HEENT: mucous membranes moist NECK: supple w/o LA CV: rrr.   PULM: ctab, no inc wob EXT: no edema SKIN: no acute rash

## 2015-12-30 NOTE — Assessment & Plan Note (Signed)
Good control on current regimen, consider adding zyrtec and removing mild from home.  Given rx for albuterol neb to use in emergencies as she feels she id better with this in past.

## 2016-03-22 ENCOUNTER — Other Ambulatory Visit: Payer: Self-pay | Admitting: Nurse Practitioner

## 2016-03-22 DIAGNOSIS — N6310 Unspecified lump in the right breast, unspecified quadrant: Secondary | ICD-10-CM

## 2016-03-23 ENCOUNTER — Ambulatory Visit
Admission: RE | Admit: 2016-03-23 | Discharge: 2016-03-23 | Disposition: A | Discharge status: Home / Self Care | Payer: BC Managed Care – PPO | Source: Ambulatory Visit | Attending: Nurse Practitioner | Admitting: Nurse Practitioner

## 2016-03-23 DIAGNOSIS — N6310 Unspecified lump in the right breast, unspecified quadrant: Secondary | ICD-10-CM

## 2016-03-30 ENCOUNTER — Other Ambulatory Visit: Payer: Self-pay | Admitting: Family Medicine

## 2016-03-30 NOTE — Telephone Encounter (Signed)
Last office visit 12/30/2015.  Last refilled 06/17/2015 for #30 with no refills.  Ok to refill?

## 2016-04-11 ENCOUNTER — Other Ambulatory Visit: Payer: Self-pay | Admitting: Family Medicine

## 2016-04-12 NOTE — Telephone Encounter (Signed)
Last office visit 12/30/2015.  Last refilled 03/30/16 for #30 with no refills.  Refill?

## 2016-04-30 ENCOUNTER — Telehealth: Payer: Self-pay | Admitting: Family Medicine

## 2016-04-30 NOTE — Telephone Encounter (Deleted)
Nancy Wilson OT wth [facility] left v/m reporting; pt had a fall on 04/20/16; pt was bending forward to vomit and pt lost balance and pt hit forehead;forehead is sore and pt reports some dizziness since the fall. Nancy request verbal order for OT home health to do vestibular evaluation and treat as needed. Nancy request cb.   

## 2016-04-30 NOTE — Telephone Encounter (Signed)
Unable to reach pt by phone; left v/m requesting pt to cb.

## 2016-04-30 NOTE — Telephone Encounter (Signed)
Pt already has appt on 05/04/16 at 4 pm to see Dr Darnell Level; pt needs late appt because of work. 2 weeks ago pt saw blood tinged mixed with stool. Pt will cb if has any more blood. Pt drinking fruit juice and using stool softeners now also.FYI to Dr Darnell Level.

## 2016-04-30 NOTE — Telephone Encounter (Signed)
Patient Name: Brittany [name] DOB: [birth year] Initial Comment constipated, had colon spasms this weekend. Nurse Assessment Guidelines Guideline Title Affirmed Question Affirmed Notes Final Disposition User FINAL ATTEMPT MADE - no message left [place], Therapist, sports, Kermit Balo

## 2016-05-01 ENCOUNTER — Telehealth: Payer: Self-pay | Admitting: Family Medicine

## 2016-05-01 DIAGNOSIS — K921 Melena: Secondary | ICD-10-CM

## 2016-05-01 DIAGNOSIS — K5900 Constipation, unspecified: Secondary | ICD-10-CM

## 2016-05-01 NOTE — Telephone Encounter (Signed)
Sent in referral. I would be happy to see pt for this issue if referral  appt not in next few weeks.

## 2016-05-01 NOTE — Telephone Encounter (Signed)
Pt would like to have a referral for gi doctor. She wants the first available appt.  She has an appt on Friday for this.  Pt has not seen you about this issue yet.  cb number is [phone]

## 2016-05-01 NOTE — Telephone Encounter (Signed)
Need to know what issue to link to referral.. Get symptoms etc.

## 2016-05-01 NOTE — Telephone Encounter (Signed)
Spoke with Hinton Dyer.  She is having colon spasms, blood in stool and constipation.

## 2016-05-02 ENCOUNTER — Encounter: Payer: Self-pay | Admitting: Gastroenterology

## 2016-05-02 NOTE — Telephone Encounter (Signed)
Appt made with Dr Silverio Decamp form 05/04/16 and patient notified.

## 2016-05-03 ENCOUNTER — Ambulatory Visit: Payer: BC Managed Care – PPO | Admitting: Family Medicine

## 2016-05-04 ENCOUNTER — Encounter: Payer: Self-pay | Admitting: Gastroenterology

## 2016-05-04 ENCOUNTER — Ambulatory Visit: Payer: BC Managed Care – PPO | Admitting: Family Medicine

## 2016-05-04 ENCOUNTER — Ambulatory Visit (INDEPENDENT_AMBULATORY_CARE_PROVIDER_SITE_OTHER): Payer: BC Managed Care – PPO | Admitting: Gastroenterology

## 2016-05-04 VITALS — BP 110/86 | HR 64 | Ht 68.0 in | Wt 262.1 lb

## 2016-05-04 DIAGNOSIS — K602 Anal fissure, unspecified: Secondary | ICD-10-CM | POA: Diagnosis not present

## 2016-05-04 DIAGNOSIS — Z8379 Family history of other diseases of the digestive system: Secondary | ICD-10-CM | POA: Diagnosis not present

## 2016-05-04 DIAGNOSIS — K625 Hemorrhage of anus and rectum: Secondary | ICD-10-CM

## 2016-05-04 DIAGNOSIS — K5900 Constipation, unspecified: Secondary | ICD-10-CM | POA: Diagnosis not present

## 2016-05-04 MED ORDER — LINACLOTIDE 72 MCG PO CAPS: 72.0000 ug | ORAL_CAPSULE | Freq: Every day | ORAL | 2 refills | Status: DC

## 2016-05-04 MED ORDER — NA SULFATE-K SULFATE-MG SULF 17.5-3.13-1.6 GM/177ML PO SOLN: 1.0000 | Freq: Once | ORAL | 0 refills | Status: AC

## 2016-05-04 MED ORDER — AMBULATORY NON FORMULARY MEDICATION: 1 refills | Status: DC

## 2016-05-04 NOTE — Patient Instructions (Addendum)
You have been scheduled for a colonoscopy. Please follow written instructions given to you at your visit today.  Please pick up your prep supplies at the pharmacy within the next 1-3 days. If you use inhalers (even only as needed), please bring them with you on the day of your procedure. Your physician has requested that you go to www.startemmi.com and enter the access code given to you at your visit today. This web site gives a general overview about your procedure. However, you should still follow specific instructions given to you by our office regarding your preparation for the procedure.   We have sent the following medications to your pharmacy for you to pick up at your convenience: Suprep Linzess  Take Benefiber 1 tablespoon three times daily.  If you are age 53 or older, your body mass index should be between 23-30. Your Body mass index is 39.85 kg/m. If this is out of the aforementioned range listed, please consider follow up with your Primary Care Provider.  If you are age 14 or younger, your body mass index should be between 19-25. Your Body mass index is 39.85 kg/m. If this is out of the aformentioned range listed, please consider follow up with your Primary Care Provider.   Follow up with Dr. Silverio Decamp in three months.    Thank you for choosing me and [facility].  Dr. Pat Kocher

## 2016-05-04 NOTE — Progress Notes (Signed)
Brittany [name]    [id number]    [birth year]  Primary Care Physician:Amy Diona Browner, MD  Referring Physician: [name], MD [address] [place], [place] [address]  Chief complaint:  Rectal pain, blood per rectum, lower abdominal pain, constipation HPI: 31 yr F here for new patient evaluation with c/o change in bowel habits in the past 1 year with constipation and that has become progressively worse in past few months. She also had an episode of bright red blood per rectum that has spontaneously resolved in 2 days. She started have anorectal pain since last weekend with spasms and is worse with bowel movement. She has family history of ulcerative colitis in her paternal uncle, aunt and may be her father has mild symptoms. Her sister has severe ulcerative colitis s/o total colectomy with J pouch.  No family h/o colon cancer. She never had a colonoscopy. She is extremely anxious and worried if she has colitis and would like to undergo testing.  She is currently taking Miralax 3 capfuls daily as needed along with Prune juice and intermittent irregular bowel movements.     Outpatient Encounter Prescriptions as of 05/04/2016  Medication Sig  . albuterol (PROVENTIL HFA;VENTOLIN HFA) 108 (90 Base) MCG/ACT inhaler Inhale 2 puffs into the lungs every 6 (six) hours as needed.  Marland Kitchen albuterol (PROVENTIL) (2.5 MG/3ML) 0.083% nebulizer solution Take 3 mLs (2.5 mg total) by nebulization every 6 (six) hours as needed for wheezing or shortness of breath.  . beclomethasone (QVAR) 40 MCG/ACT inhaler Inhale 2 puffs into the lungs 2 (two) times daily.  . diclofenac (VOLTAREN) 75 MG EC tablet TAKE 1 TABLET (75 MG TOTAL) BY MOUTH 2 (TWO) TIMES DAILY.  Marland Kitchen EPIPEN 2-PAK 0.3 MG/0.3ML SOAJ injection INJECT AS DIRECTED  . metFORMIN (GLUCOPHAGE) 1000 MG tablet Take 1,000 mg by mouth 2 (two) times daily.  . naproxen sodium (ANAPROX) 550 MG tablet Reported on 04/09/2015  . traMADol (ULTRAM) 50 MG tablet Take  50 mg by mouth 3 (three) times daily as needed.   No facility-administered encounter medications on file as of 05/04/2016.     Allergies as of 05/04/2016 - Review Complete 05/04/2016  Allergen Reaction Noted  . Iodine  06/01/2010  . Shellfish allergy  06/01/2010  . Other Nausea And Vomiting 10/12/2015    Past Medical History:  Diagnosis Date  . Asthma   . Heart murmur   . PCOS (polycystic ovarian syndrome)     Past Surgical History:  Procedure Laterality Date  . NO PAST SURGERIES      Family History  Problem Relation Age of Onset  . Ulcerative colitis Sister   . Hyperlipidemia Mother   . Hypertension Brother     constrolled with diet  . Heart attack Paternal Grandfather   . Diabetes Maternal Aunt   . Heart attack Maternal Aunt   . Cancer Paternal Aunt     ? type    Social History   Social History  . Marital status: Married    Spouse name: N/A  . Number of children: 2  . Years of education: N/A   Occupational History  . teacher    Social History Main Topics  . Smoking status: Never Smoker  . Smokeless tobacco: Never Used  . Alcohol use 0.0 oz/week     Comment: 1 drink every 2 months   . Drug use: No  . Sexual activity: Not on file   Other Topics  Concern  . Not on file   Social History Narrative  . No narrative on file      Review of systems: Review of Systems  Constitutional: Negative for fever and chills.  positive for lack of energy HENT: Negative.   Eyes: Negative for blurred vision.  Respiratory: Negative for cough, shortness of breath and wheezing.   Cardiovascular: Negative for chest pain and palpitations.  Gastrointestinal: as per HPI Genitourinary: Negative for dysuria, urgency, frequency and hematuria.  Musculoskeletal: Positive for myalgias, back pain and joint pain.  Skin: Negative for itching and rash.  Neurological: Negative for dizziness, tremors, focal weakness, seizures and loss of consciousness.  Endo/Heme/Allergies: Negative  for seasonal allergies.  Psychiatric/Behavioral: Negative for depression, suicidal ideas and hallucinations.  All other systems reviewed and are negative.   Physical Exam: Vitals:   05/04/16 1415  BP: 110/86  Pulse: 64   Body mass index is 39.85 kg/m. Gen:      No acute distress HEENT:  EOMI, sclera anicteric Neck:     No masses; no thyromegaly Lungs:    Clear to auscultation bilaterally; normal respiratory effort CV:         Regular rate and rhythm; no murmurs Abd:      + bowel sounds; soft, non-tender; no palpable masses, no distension Ext:    No edema; adequate peripheral perfusion Skin:      Warm and dry; no rash Neuro: alert and oriented x 3 Psych: normal mood and affect Rectal exam: Increased anal sphincter tone, + posterior anal fissure at 5'0clock   Data Reviewed:  Reviewed labs, radiology imaging, old records and pertinent past GI work up   Assessment and Plan/Recommendations: 29 yr F with significant family history of ulcerative colitis (Sister with severe ulcerative colitis s/p total colectomy with J pouch) and distant relatives with mild ulcerative colitis here with c/o change in bowel habits with constipation, anal pain for past few days and also had an episode of blood per rectum Patient has anal fissure on exam BRBPR secondary to anal fissure vs bleeding internal hemorrhoids. Cannot exclude proctitis, inflammatory polyps or malignancy Will schedule for colonoscopy in next 4-6 weeks to allow for anal fissure to heal The risks and benefits as well as alternatives of endoscopic procedure(s) have been discussed and reviewed. All questions answered. The patient agrees to proceed. Rectal nitroglycerine 0.125% three times daily Advised patient to increase dietary fluid and fiber intake Start Benefiber 1 tablespoon TID with meals Linzess 72 mcg daily Return in 3 months or sooner if needed  Greater than 50% of the time used for counseling as well as treatment plan and  follow-up. She had multiple questions which were answered to her satisfaction  K. Denzil Magnuson , MD [phone] Mon-Fri 8a-5p [phone] after 5p, weekends, holidays  CC: [name], MD

## 2016-05-07 ENCOUNTER — Encounter: Payer: Self-pay | Admitting: Gastroenterology

## 2016-05-08 ENCOUNTER — Ambulatory Visit: Payer: BC Managed Care – PPO | Admitting: Family Medicine

## 2016-05-16 ENCOUNTER — Ambulatory Visit (AMBULATORY_SURGERY_CENTER): Payer: BC Managed Care – PPO | Admitting: Gastroenterology

## 2016-05-16 ENCOUNTER — Encounter: Payer: Self-pay | Admitting: Gastroenterology

## 2016-05-16 VITALS — BP 117/72 | HR 66 | Temp 98.6°F | Resp 17 | Ht 68.0 in | Wt 262.0 lb

## 2016-05-16 DIAGNOSIS — K5730 Diverticulosis of large intestine without perforation or abscess without bleeding: Secondary | ICD-10-CM | POA: Diagnosis not present

## 2016-05-16 DIAGNOSIS — K625 Hemorrhage of anus and rectum: Secondary | ICD-10-CM

## 2016-05-16 DIAGNOSIS — Z8379 Family history of other diseases of the digestive system: Secondary | ICD-10-CM

## 2016-05-16 MED ORDER — SODIUM CHLORIDE 0.9 % IV SOLN: 500.0000 mL | INTRAVENOUS | Status: DC

## 2016-05-16 NOTE — Progress Notes (Signed)
Called to room to assist during endoscopic procedure.  Patient ID and intended procedure confirmed with present staff. Received instructions for my participation in the procedure from the performing physician.  

## 2016-05-16 NOTE — Progress Notes (Signed)
A/ox3 pleased with MAC, report to Penny RN 

## 2016-05-16 NOTE — Patient Instructions (Signed)
YOU HAD AN ENDOSCOPIC PROCEDURE TODAY AT [facility]:   Refer to the procedure report that was given to you for any specific questions about what was found during the examination.  If the procedure report does not answer your questions, please call your gastroenterologist to clarify.  If you requested that your care partner not be given the details of your procedure findings, then the procedure report has been included in a sealed envelope for you to review at your convenience later.  YOU SHOULD EXPECT: Some feelings of bloating in the abdomen. Passage of more gas than usual.  Walking can help get rid of the air that was put into your GI tract during the procedure and reduce the bloating. If you had a lower endoscopy (such as a colonoscopy or flexible sigmoidoscopy) you may notice spotting of blood in your stool or on the toilet paper. If you underwent a bowel prep for your procedure, you may not have a normal bowel movement for a few days.  Please Note:  You might notice some irritation and congestion in your nose or some drainage.  This is from the oxygen used during your procedure.  There is no need for concern and it should clear up in a day or so.  SYMPTOMS TO REPORT IMMEDIATELY:   Following lower endoscopy (colonoscopy or flexible sigmoidoscopy):  Excessive amounts of blood in the stool  Significant tenderness or worsening of abdominal pains  Swelling of the abdomen that is new, acute  Fever of 100F or higher    For urgent or emergent issues, a gastroenterologist can be reached at any hour by calling [phone].   DIET:  We do recommend a small meal at first, but then you may proceed to your regular diet.  Drink plenty of fluids but you should avoid alcoholic beverages for 24 hours.  ACTIVITY:  You should plan to take it easy for the rest of today and you should NOT DRIVE or use heavy machinery until tomorrow (because of the sedation medicines used during the test).     FOLLOW UP: Our staff will call the number listed on your records the next business day following your procedure to check on you and address any questions or concerns that you may have regarding the information given to you following your procedure. If we do not reach you, we will leave a message.  However, if you are feeling well and you are not experiencing any problems, there is no need to return our call.  We will assume that you have returned to your regular daily activities without incident.  If any biopsies were taken you will be contacted by phone or by letter within the next 1-3 weeks.  Please call us at [phone] if you have not heard about the biopsies in 3 weeks.    SIGNATURES/CONFIDENTIALITY: You and/or your care partner have signed paperwork which will be entered into your electronic medical record.  These signatures attest to the fact that that the information above on your After Visit Summary has been reviewed and is understood.  Full responsibility of the confidentiality of this discharge information lies with you and/or your care-partner.    Await biopsy results from Dr Silverio Decamp  Information on diverticulosis given to you today  Continue previous diet and medications

## 2016-05-16 NOTE — Op Note (Signed)
[place] Patient Name: Brittany [name] Procedure Date: 05/16/2016 10:50 AM MRN: [id number] Endoscopist: Mauri Pole , MD Age: 41 Referring MD:  Date of Birth: 05/31/1975 Gender: Female Account #: [id number] Procedure:                Colonoscopy Indications:              Evaluation of unexplained GI bleeding Medicines:                Monitored Anesthesia Care Procedure:                Pre-Anesthesia Assessment:                           - Prior to the procedure, a History and Physical                            was performed, and patient medications and                            allergies were reviewed. The patient's tolerance of                            previous anesthesia was also reviewed. The risks                            and benefits of the procedure and the sedation                            options and risks were discussed with the patient.                            All questions were answered, and informed consent                            was obtained. Prior Anticoagulants: The patient has                            taken no previous anticoagulant or antiplatelet                            agents. ASA Grade Assessment: II - A patient with                            mild systemic disease. After reviewing the risks                            and benefits, the patient was deemed in                            satisfactory condition to undergo the procedure.                           After obtaining informed consent, the colonoscope  was passed under direct vision. Throughout the                            procedure, the patient's blood pressure, pulse, and                            oxygen saturations were monitored continuously. The                            Model CF-HQ190L [id number]) scope was introduced                            through the anus and advanced to the the terminal                            ileum, with  identification of the appendiceal                            orifice and IC valve. The colonoscopy was performed                            without difficulty. The patient tolerated the                            procedure well. The quality of the bowel                            preparation was excellent. The terminal ileum,                            ileocecal valve, appendiceal orifice, and rectum                            were photographed. Scope In: 10:57:39 AM Scope Out: 11:15:15 AM Scope Withdrawal Time: 0 hours 11 minutes 50 seconds  Total Procedure Duration: 0 hours 17 minutes 36 seconds  Findings:                 The perianal and digital rectal examinations were                            normal.                           The terminal ileum appeared normal.                           A localized area of mildly erythematous mucosa was                            found in the rectum. Biopsies were taken with a                            cold forceps for histology.  The exam was otherwise without abnormality, rest of                            colonic mucosa appeared normal.                           Scattered small-mouthed diverticula were found in                            the sigmoid colon, descending colon and ascending                            colon. Complications:            No immediate complications. Estimated Blood Loss:     Estimated blood loss was minimal. Impression:               - The examined portion of the ileum was normal.                           - Erythematous mucosa in the rectum. Biopsied.                           - The examination was otherwise normal.                           - Diverticulosis in the sigmoid colon, in the                            descending colon and in the ascending colon. Recommendation:           - Patient has a contact number available for                            emergencies. The signs and symptoms of  potential                            delayed complications were discussed with the                            patient. Return to normal activities tomorrow.                            Written discharge instructions were provided to the                            patient.                           - Resume previous diet.                           - Continue present medications.                           - Await pathology results.                           -  Repeat colonoscopy date to be determined after                            pending pathology results are reviewed for                            surveillance based on pathology results.                           - Return to GI clinic PRN. Mauri Pole, MD 05/16/2016 11:23:50 AM This report has been signed electronically.

## 2016-05-16 NOTE — Progress Notes (Signed)
Pt's states no medical or surgical changes since previsit or office visit. 

## 2016-05-17 ENCOUNTER — Telehealth: Payer: Self-pay

## 2016-05-17 NOTE — Telephone Encounter (Signed)
  Follow up Call-  Call back number 05/16/2016  Post procedure Call Back phone  # [phone]  Permission to leave phone message Yes  Some recent data might be hidden     Patient questions:  Do you have a fever, pain , or abdominal swelling? No. Pain Score  0 *  Have you tolerated food without any problems? Yes.    Have you been able to return to your normal activities? Yes.    Do you have any questions about your discharge instructions: Diet   No. Medications  No. Follow up visit  No.  Do you have questions or concerns about your Care? No.  Actions: * If pain score is 4 or above: No action needed, pain <4.

## 2016-05-25 ENCOUNTER — Encounter: Payer: Self-pay | Admitting: Gastroenterology

## 2016-06-19 ENCOUNTER — Other Ambulatory Visit: Payer: Self-pay | Admitting: Family Medicine

## 2016-07-25 ENCOUNTER — Institutional Professional Consult (permissible substitution): Payer: BC Managed Care – PPO | Admitting: Family Medicine

## 2016-09-09 ENCOUNTER — Other Ambulatory Visit: Payer: Self-pay | Admitting: Family Medicine

## 2016-09-10 NOTE — Telephone Encounter (Signed)
Last office visit 12/30/2015.  Last refilled 04/13/2016 for #30 with no refills.  Ok to refill?

## 2016-09-18 ENCOUNTER — Other Ambulatory Visit: Payer: Self-pay | Admitting: Gastroenterology

## 2016-10-12 LAB — LIPID PANEL
CHOLESTEROL: 215 — AB (ref 0–200)
HDL: 70 (ref 35–70)
LDL Cholesterol: 134
LDl/HDL Ratio: 3.1
TRIGLYCERIDES: 56 (ref 40–160)

## 2016-10-12 LAB — BASIC METABOLIC PANEL
Glucose: 78
POTASSIUM: 3.7 (ref 3.4–5.3)
SODIUM: 138 (ref 137–147)

## 2016-10-12 LAB — VITAMIN D 25 HYDROXY (VIT D DEFICIENCY, FRACTURES): Vit D, 25-Hydroxy: 38

## 2016-10-12 LAB — TSH: TSH: 2.79 (ref <=5.90)

## 2016-10-16 LAB — HEMOGLOBIN A1C: Hemoglobin A1C: 4.6

## 2016-10-17 ENCOUNTER — Other Ambulatory Visit: Payer: Self-pay | Admitting: Family Medicine

## 2016-11-01 ENCOUNTER — Telehealth: Payer: Self-pay | Admitting: Family Medicine

## 2016-11-01 NOTE — Telephone Encounter (Signed)
Patient Name: Brittany [name] DOB: [birth year] Initial Comment Caller states she is having pressure in her head that is very brief and dizziness. Nurse Assessment Guidelines Guideline Title Affirmed Question Affirmed Notes Final Disposition User FINAL ATTEMPT MADE - message left [place], Therapist, sports, Estill Bamberg

## 2016-11-01 NOTE — Telephone Encounter (Signed)
Pt called back and is not having any symptoms now but since June 2018 on and off has pressure feeling in head with dizziness. Pt request appt at [facility]. Pt scheduled with Dr Diona Browner 11/02/16 at 9 Am;if condition worsens tonight pt will go to [facility] or ED. FYI to Dr Diona Browner.

## 2016-11-01 NOTE — Telephone Encounter (Signed)
Unable to reach pt by phone. Left v/m requesting cb on pts cell.

## 2016-11-02 ENCOUNTER — Ambulatory Visit (INDEPENDENT_AMBULATORY_CARE_PROVIDER_SITE_OTHER): Payer: BC Managed Care – PPO | Admitting: Family Medicine

## 2016-11-02 ENCOUNTER — Encounter: Payer: Self-pay | Admitting: Family Medicine

## 2016-11-02 VITALS — BP 104/80 | HR 68 | Temp 98.7°F | Ht 69.0 in | Wt 252.5 lb

## 2016-11-02 DIAGNOSIS — R42 Dizziness and giddiness: Secondary | ICD-10-CM | POA: Diagnosis not present

## 2016-11-02 LAB — CBC WITH DIFFERENTIAL/PLATELET
Basophils Absolute: 0.1 10*3/uL (ref 0.0–0.1)
Basophils Relative: 2.5 % (ref 0.0–3.0)
EOS PCT: 1.1 % (ref 0.0–5.0)
Eosinophils Absolute: 0 10*3/uL (ref 0.0–0.7)
HCT: 39.3 % (ref 36.0–46.0)
HEMOGLOBIN: 12.4 g/dL (ref 12.0–15.0)
LYMPHS ABS: 1.4 10*3/uL (ref 0.7–4.0)
Lymphocytes Relative: 37.8 % (ref 12.0–46.0)
MCHC: 31.6 g/dL (ref 30.0–36.0)
MCV: 86.7 fl (ref 78.0–100.0)
MONOS PCT: 7 % (ref 3.0–12.0)
Monocytes Absolute: 0.3 10*3/uL (ref 0.1–1.0)
Neutro Abs: 2 10*3/uL (ref 1.4–7.7)
Neutrophils Relative %: 51.6 % (ref 43.0–77.0)
Platelets: 265 10*3/uL (ref 150.0–400.0)
RBC: 4.54 Mil/uL (ref 3.87–5.11)
RDW: 13.4 % (ref 11.5–15.5)
WBC: 3.8 10*3/uL — ABNORMAL LOW (ref 4.0–10.5)

## 2016-11-02 LAB — COMPREHENSIVE METABOLIC PANEL
ALBUMIN: 4.4 g/dL (ref 3.5–5.2)
ALT: 12 U/L (ref 0–35)
AST: 16 U/L (ref 0–37)
Alkaline Phosphatase: 45 U/L (ref 39–117)
BUN: 13 mg/dL (ref 6–23)
CO2: 29 mEq/L (ref 19–32)
Calcium: 9.3 mg/dL (ref 8.4–10.5)
Chloride: 106 mEq/L (ref 96–112)
Creatinine, Ser: 1.06 mg/dL (ref 0.40–1.20)
GFR: 74.12 mL/min (ref >60.00)
Glucose, Bld: 84 mg/dL (ref 70–99)
POTASSIUM: 4.1 meq/L (ref 3.5–5.1)
SODIUM: 138 meq/L (ref 135–145)
Total Bilirubin: 0.8 mg/dL (ref 0.2–1.2)
Total Protein: 7.5 g/dL (ref 6.0–8.3)

## 2016-11-02 LAB — VITAMIN B12: Vitamin B-12: 415 pg/mL (ref 211–911)

## 2016-11-02 LAB — TSH: TSH: 1.76 u[IU]/mL (ref 0.35–4.50)

## 2016-11-02 LAB — VITAMIN D 25 HYDROXY (VIT D DEFICIENCY, FRACTURES): VITD: 46.58 ng/mL (ref 30.00–100.00)

## 2016-11-02 NOTE — Patient Instructions (Signed)
Please stop at the lab to have labs drawn.  Increase water, work on stress reduction , do not skip meals.

## 2016-11-02 NOTE — Assessment & Plan Note (Addendum)
No specific symtpoms.  No clear cardiac or pulmonary cause.  Nml neuro exam.  Will eval with labs.   Increase water. ? Related to stress.. Work on stress reduction.

## 2016-11-02 NOTE — Progress Notes (Signed)
Subjective:    Patient ID: Brittany [name], female    DOB: Apr 16, 1975, 41 y.o.   MRN: [id number]  HPI  41 year old female presents with new onset head pressure.  She reports  episodes of dizziness, funny pressure in head.. Ongoing several times a day.  No pain. Cannot pinpoint where pressure is " just inside my head"  No associated nausea, no vision change Last few seconds at a time.ome days worse than others.  More frequent recently.Buddy Duty in June. Nml BPs.  No vertigo  She is always tired. She is going through a separation so is stressed out.  No depression, no anxiety. No insomnia.  No change with position, no association with stressful event. No occurring in any environment. Mildly heavy menses.  At GYN neg test for DM.   She drinks a lot of water. She stopped  Linzess, but it did not help.  She has not taken ketorolac yet.. Will use for painful menses.    She has upcoming eye MD appointment.. She does have to squint now some in last several months.  Social History /Family History/Past Medical History reviewed in detail and updated in EMR if needed. Blood pressure 104/80, pulse 68, temperature 98.7 F (37.1 C), temperature source Oral, height 5' 9"  (1.753 m), weight 252 lb 8 oz (114.5 kg), last menstrual period 10/15/2016.   Review of Systems  Constitutional: Negative for fatigue and fever.  HENT: Negative for ear pain.   Eyes: Negative for pain.  Respiratory: Negative for chest tightness and shortness of breath.   Cardiovascular: Negative for chest pain, palpitations and leg swelling.  Gastrointestinal: Negative for abdominal pain.  Genitourinary: Negative for dysuria.       Objective:   Physical Exam  Constitutional: She is oriented to person, place, and time. Vital signs are normal. She appears well-developed and well-nourished. She is cooperative.  Non-toxic appearance. She does not appear ill. No distress.  HENT:  Head: Normocephalic.  Right Ear: Hearing,  tympanic membrane, external ear and ear canal normal. Tympanic membrane is not erythematous, not retracted and not bulging.  Left Ear: Hearing, tympanic membrane, external ear and ear canal normal. Tympanic membrane is not erythematous, not retracted and not bulging.  Nose: No mucosal edema or rhinorrhea. Right sinus exhibits no maxillary sinus tenderness and no frontal sinus tenderness. Left sinus exhibits no maxillary sinus tenderness and no frontal sinus tenderness.  Mouth/Throat: Uvula is midline, oropharynx is clear and moist and mucous membranes are normal.  Eyes: Pupils are equal, round, and reactive to light. Conjunctivae, EOM and lids are normal. Lids are everted and swept, no foreign bodies found.  Neck: Trachea normal and normal range of motion. Neck supple. Carotid bruit is not present. No thyroid mass and no thyromegaly present.  Cardiovascular: Normal rate, regular rhythm, S1 normal, S2 normal, normal heart sounds, intact distal pulses and normal pulses.  Exam reveals no gallop and no friction rub.   No murmur heard. Pulmonary/Chest: Effort normal and breath sounds normal. No tachypnea. No respiratory distress. She has no decreased breath sounds. She has no wheezes. She has no rhonchi. She has no rales.  Abdominal: Soft. Normal appearance and bowel sounds are normal. There is no tenderness.  Neurological: She is alert and oriented to person, place, and time. She has normal strength and normal reflexes. No cranial nerve deficit or sensory deficit. She exhibits normal muscle tone. She displays a negative Romberg sign. Coordination and gait normal. GCS eye subscore is 4.  GCS verbal subscore is 5. GCS motor subscore is 6.  Nml cerebellar exam   No papilledema  Skin: Skin is warm, dry and intact. No rash noted.  Psychiatric: She has a normal mood and affect. Her speech is normal and behavior is normal. Judgment and thought content normal. Her mood appears not anxious. Cognition and memory are  normal. Cognition and memory are not impaired. She does not exhibit a depressed mood. She exhibits normal recent memory and normal remote memory.          Assessment & Plan:

## 2016-11-07 ENCOUNTER — Telehealth: Payer: Self-pay | Admitting: Family Medicine

## 2016-11-07 NOTE — Telephone Encounter (Signed)
Received a fax from pt's pharmacy stating her insurance will no longer cover her qvar inhaler. Contacted her pharmacy to verify if this is correct and they stated this was, they are unable to provide me with covered alternatives.   Contacted pt and she is aware, she will reach out to her insurance company to find out the covered alternatives. She will contact us back with the information to send to physician. Pt states she has enough medication. Will await her call back

## 2016-11-14 ENCOUNTER — Encounter: Payer: Self-pay | Admitting: Family Medicine

## 2017-03-17 ENCOUNTER — Other Ambulatory Visit: Payer: Self-pay | Admitting: Family Medicine

## 2017-06-17 ENCOUNTER — Other Ambulatory Visit: Payer: Self-pay | Admitting: Family Medicine

## 2017-06-18 ENCOUNTER — Other Ambulatory Visit: Payer: Self-pay | Admitting: Family Medicine

## 2017-06-18 ENCOUNTER — Telehealth: Payer: Self-pay | Admitting: Family Medicine

## 2017-06-18 NOTE — Telephone Encounter (Signed)
Copied from [place] [id number]. Topic: Quick Communication - See Telephone Encounter >> Jun 18, 2017  8:37 AM Ether Griffins B wrote: CRM for notification. See Telephone encounter for: 06/18/17. Pt is going out of town tomorrow and is hoping to get a new script called in for her epi pen today. [id number] [place], [place] Pt requesting to check on other scripts to see if anything else is due. But is hoping to get the epi pen called in today since she is  going out of town. Pt is also requesting a refill on the pain medication that was given for her knee pain as she is in a brace now.

## 2017-06-18 NOTE — Telephone Encounter (Addendum)
EPI Pen refill sent as requested.  Diclofenac last refilled 09/10/2016 for #30 with no refills.  Ok to refill?  (See Refill Request)

## 2017-06-18 NOTE — Telephone Encounter (Signed)
Diclofenac refill sent to pharmacy by Dr. Diona Browner.

## 2017-06-22 ENCOUNTER — Other Ambulatory Visit: Payer: Self-pay | Admitting: Family Medicine

## 2017-06-30 ENCOUNTER — Other Ambulatory Visit: Payer: Self-pay | Admitting: Family Medicine

## 2017-07-01 NOTE — Telephone Encounter (Signed)
Last office visit 11/02/2016.  Last refilled 06/18/2017 for #30 with no refills.  Ok to refill?

## 2017-09-18 ENCOUNTER — Other Ambulatory Visit: Payer: Self-pay | Admitting: Family Medicine

## 2017-10-08 ENCOUNTER — Other Ambulatory Visit: Payer: Self-pay | Admitting: Obstetrics and Gynecology

## 2017-10-08 DIAGNOSIS — Z1231 Encounter for screening mammogram for malignant neoplasm of breast: Secondary | ICD-10-CM

## 2017-10-16 LAB — HM PAP SMEAR

## 2017-10-21 ENCOUNTER — Other Ambulatory Visit: Payer: Self-pay | Admitting: Family Medicine

## 2017-10-23 ENCOUNTER — Ambulatory Visit: Payer: BC Managed Care – PPO | Admitting: Family Medicine

## 2017-10-23 ENCOUNTER — Ambulatory Visit (INDEPENDENT_AMBULATORY_CARE_PROVIDER_SITE_OTHER)
Admission: RE | Admit: 2017-10-23 | Discharge: 2017-10-23 | Disposition: A | Discharge status: Home / Self Care | Payer: BC Managed Care – PPO | Source: Ambulatory Visit | Attending: Family Medicine | Admitting: Family Medicine

## 2017-10-23 ENCOUNTER — Encounter: Payer: Self-pay | Admitting: Family Medicine

## 2017-10-23 VITALS — BP 110/74 | HR 56 | Temp 98.9°F | Ht 69.0 in | Wt 274.5 lb

## 2017-10-23 DIAGNOSIS — M25562 Pain in left knee: Secondary | ICD-10-CM | POA: Diagnosis not present

## 2017-10-23 DIAGNOSIS — M2392 Unspecified internal derangement of left knee: Secondary | ICD-10-CM | POA: Diagnosis not present

## 2017-10-23 NOTE — Progress Notes (Signed)
Dr. Frederico Hamman T. Avalon Coppinger, MD, [facility] [place], [address] Phone: [phone] Fax: [phone]  10/23/2017  Patient: Brittany [name], MRN: [id number], DOB: May 20, 1975, 42 y.o.  Primary Physician:  [name], MD   Chief Complaint  Patient presents with  . Knee Pain    Left   Subjective:   Brittany [name] is a 42 y.o. very pleasant female patient who presents with the following:  09/16/2017, DOI  Took some voltaren, has been walking in the mornings. Was doing some sprints. Banged up her L knee and went back to work out and swelled.   She was relatively resting it, and then she try to increase her activity and resume her running program last week.  Since doing that she has been having increased pain and some swelling in her knee.  She is not having any functional giving way of her knee.  She is not having any locking up or any mechanical symptoms.  She does have some prior history of some anterior knee pain in the past.  I have seen her for this before as well.  Past Medical History, Surgical History, Social History, Family History, Problem List, Medications, and Allergies have been reviewed and updated if relevant.  Patient Active Problem List   Diagnosis Date Noted  . Dizziness 11/02/2016  . Moderate persistent asthma in adult without complication 65/68/1275  . Acid reflux 04/09/2015  . Morbid obesity ([place]) 11/26/2014  . PCOS (polycystic ovarian syndrome) 11/26/2014  . ADD (attention deficit disorder) 09/10/2013  . Anemia 09/10/2013  . Leukocytopenia, unspecified 09/10/2013  . HYPERLIPIDEMIA 11/26/2006  . OBESITY 11/26/2006    Past Medical History:  Diagnosis Date  . Asthma   . Heart murmur   . PCOS (polycystic ovarian syndrome)     Past Surgical History:  Procedure Laterality Date  . NO PAST SURGERIES      Social History   Socioeconomic History  . Marital status: Married    Spouse name: Not on  file  . Number of children: 2  . Years of education: Not on file  . Highest education level: Not on file  Occupational History  . Occupation: Pharmacist, hospital  Social Needs  . Financial resource strain: Not on file  . Food insecurity:    Worry: Not on file    Inability: Not on file  . Transportation needs:    Medical: Not on file    Non-medical: Not on file  Tobacco Use  . Smoking status: Never Smoker  . Smokeless tobacco: Never Used  Substance and Sexual Activity  . Alcohol use: Yes    Alcohol/week: 0.0 oz    Comment: 1 drink every 2 months   . Drug use: No  . Sexual activity: Not on file  Lifestyle  . Physical activity:    Days per week: Not on file    Minutes per session: Not on file  . Stress: Not on file  Relationships  . Social connections:    Talks on phone: Not on file    Gets together: Not on file    Attends religious service: Not on file    Active member of club or organization: Not on file    Attends meetings of clubs or organizations: Not on file    Relationship status: Not on file  . Intimate partner violence:    Fear of current or ex partner: Not on file    Emotionally abused: Not  on file    Physically abused: Not on file    Forced sexual activity: Not on file  Other Topics Concern  . Not on file  Social History Narrative  . Not on file    Family History  Problem Relation Age of Onset  . Ulcerative colitis Sister   . Hyperlipidemia Mother   . Hypertension Brother        constrolled with diet  . Heart attack Paternal Grandfather   . Diabetes Maternal Aunt   . Colon polyps Maternal Aunt   . Heart attack Maternal Aunt   . Colon polyps Maternal Aunt   . Cancer Paternal Aunt        ? type    Allergies  Allergen Reactions  . Iodine     REACTION: Itching in throat  . Shellfish Allergy     REACTION: GI issues; itching  . Other Nausea And Vomiting    Flax seed     Medication list reviewed and updated in full in [place].  GEN: No fevers,  chills. Nontoxic. Primarily MSK c/o today. MSK: Detailed in the HPI GI: tolerating PO intake without difficulty Neuro: No numbness, parasthesias, or tingling associated. Otherwise the pertinent positives of the ROS are noted above.   Objective:   BP 110/74   Pulse (!) 56   Temp 98.9 F (37.2 C) (Oral)   Ht 5' 9"  (1.753 m)   Wt 274 lb 8 oz (124.5 kg)   LMP 10/21/2017   BMI 40.54 kg/m    GEN: WDWN, NAD, Non-toxic, Alert & Oriented x 3 HEENT: Atraumatic, Normocephalic.  Ears and Nose: No external deformity. EXTR: No clubbing/cyanosis/edema NEURO: Normal gait.  PSYCH: Normally interactive. Conversant. Not depressed or anxious appearing.  Calm demeanor.   Knee:  R Gait: Normal heel toe pattern ROM: 0-130 Effusion: neg Echymosis or edema: none Patellar tendon NT Painful PLICA: neg Patellar grind: negative Medial and lateral patellar facet loading: negative medial and lateral joint lines: posteromedial pain Mcmurray's some pain, but mild Flexion-pinch neg Varus and valgus stress: stable Lachman: neg Ant and Post drawer: neg Hip abduction, IR, ER: WNL Hip flexion str: 5/5 Hip abd: 5/5 Quad: 5/5 VMO atrophy:No Hamstring concentric and eccentric: 5/5   Radiology: Dg Knee 4 Views W/patella Left  Result Date: 10/24/2017 CLINICAL DATA:  LEFT knee injury 1 month ago, LEFT knee pain EXAM: LEFT KNEE - COMPLETE 4+ VIEW COMPARISON:  None FINDINGS: Osseous mineralization normal. Minimal joint space narrowing. Minimal spur formation at lateral margin of lateral compartment and at patellofemoral joint. No acute fracture, dislocation, or bone destruction. No knee joint effusion. IMPRESSION: Mild degenerative changes LEFT knee. No acute abnormalities. Electronically Signed   By: Lavonia Parisha M.D.   On: 10/24/2017 08:39     Assessment and Plan:   Internal derangement of left knee  Acute pain of left knee - Plan: DG Knee 4 Views W/Patella Left  >25 minutes spent in face to face time  with patient, >50% spent in counselling or coordination of care   Suspect that the patient has a small medial meniscal tear.  She wants to be as conservative as possible, and I think that this is entirely appropriate.  She is very knowledgeable about fitness, and she is a Chief Strategy Officer.  Good to have her stop doing impact activities for now and work on some quadricep strengthening and some basic range of motion.  Also going to allow her to do some work on the bicycle and  elliptical machine if this does not cause any pain.  Follow-up: Return in about 6 weeks (around 12/04/2017).  Orders Placed This Encounter  Procedures  . DG Knee 4 Views W/Patella Left    Signed,  Marissa Weaver T. Babe Clenney, MD   Allergies as of 10/23/2017      Reactions   Iodine    REACTION: Itching in throat   Shellfish Allergy    REACTION: GI issues; itching   Other Nausea And Vomiting   Flax seed       Medication List        Accurate as of 10/23/17 11:59 PM. Always use your most recent med list.          albuterol 108 (90 Base) MCG/ACT inhaler Commonly known as:  PROVENTIL HFA;VENTOLIN HFA Inhale 2 puffs into the lungs every 6 (six) hours as needed.   albuterol (2.5 MG/3ML) 0.083% nebulizer solution Commonly known as:  PROVENTIL Take 3 mLs (2.5 mg total) by nebulization every 6 (six) hours as needed for wheezing or shortness of breath.   diclofenac 75 MG EC tablet Commonly known as:  VOLTAREN TAKE 1 TABLET BY MOUTH TWICE A DAY   EPIPEN 2-PAK 0.3 mg/0.3 mL Soaj injection Generic drug:  EPINEPHrine INJECT AS DIRECTED   ketorolac 10 MG tablet Commonly known as:  TORADOL Take 10 mg by mouth 3 (three) times daily as needed.   metFORMIN 1000 MG tablet Commonly known as:  GLUCOPHAGE Take 1,000 mg by mouth 2 (two) times daily.   naproxen sodium 550 MG tablet Commonly known as:  ANAPROX Reported on 04/09/2015   QVAR REDIHALER 40 MCG/ACT inhaler Generic drug:  beclomethasone TAKE 2 PUFFS BY MOUTH  TWICE A DAY **MAKE APPT FOR OFFICE VISIT**

## 2017-10-25 ENCOUNTER — Ambulatory Visit
Admission: RE | Admit: 2017-10-25 | Discharge: 2017-10-25 | Disposition: A | Discharge status: Home / Self Care | Payer: BC Managed Care – PPO | Source: Ambulatory Visit | Attending: Obstetrics and Gynecology | Admitting: Obstetrics and Gynecology

## 2017-10-25 DIAGNOSIS — Z1231 Encounter for screening mammogram for malignant neoplasm of breast: Secondary | ICD-10-CM

## 2017-10-31 ENCOUNTER — Other Ambulatory Visit: Payer: Self-pay | Admitting: Family Medicine

## 2017-10-31 NOTE — Telephone Encounter (Signed)
Last office visit 10/23/2017 with Dr. Lorelei Pont.  Last refilled 07/01/2017 for #30 with no refills.  Ok to refill?

## 2017-11-07 ENCOUNTER — Telehealth: Payer: Self-pay | Admitting: *Deleted

## 2017-11-07 NOTE — Telephone Encounter (Signed)
Please let me know if I can help with this.  I am more than happy to fit the patient!

## 2017-11-07 NOTE — Telephone Encounter (Signed)
We can fit her with a patellar-J brace.  We can do at her follow-up, or she can schedule an appointment to see if Brittany [name] - or possibly Brittany [name] could fit her?

## 2017-11-07 NOTE — Telephone Encounter (Signed)
Copied from [place] [id number]. Topic: General - Other >> Nov 07, 2017 11:24 AM Carolyn Stare wrote:  Pt wanted to ask Dr Lorelei Pont if there is some type of brace she can wear for her  torn meniscus

## 2017-11-07 NOTE — Telephone Encounter (Signed)
Brittany [name] notified as instructed by telephone.  She is going to try and stop by office this afternoon if she is able.  She does need to schedule her 6 week follow up with Dr. Lorelei Pont for sometime around 12/04/2017.

## 2017-11-19 ENCOUNTER — Telehealth: Payer: Self-pay | Admitting: Family Medicine

## 2017-11-19 MED ORDER — ALPRAZOLAM 0.25 MG PO TABS: 0.2500 mg | ORAL_TABLET | Freq: Every day | ORAL | 0 refills | Status: DC | PRN

## 2017-11-19 NOTE — Telephone Encounter (Signed)
Rx sent in

## 2017-11-19 NOTE — Telephone Encounter (Signed)
Brittany [name] notified as instructed by telephone.  She would like a low dose prescription of [facility] [address].  She is aware that she should not drink alcohol for 4 to 6 hours after taking this medication.

## 2017-11-19 NOTE — Telephone Encounter (Signed)
Attempted to call pt. to inquire when she is planning to leave.  Unable to contact pt.; left voice message that her questions will be forwarded to Dr. Diona Browner.

## 2017-11-19 NOTE — Telephone Encounter (Signed)
I can send in low dose alprazolam but she should not have alcohol for 4-6 hours afterward.  Other medicitons , no benzodiazepine should not be used with alcohol either.

## 2017-11-19 NOTE — Telephone Encounter (Signed)
Copied from [place]. Topic: Inquiry >> Nov 19, 2017 10:07 AM Mylinda Latina, NT wrote: Reason for CRM: Patient called and states she is about to fly and wants to know is there anything she can take for the flight to help calm her nerves. She also states is there a medication that is safe to take if she wants to have alcohol later after taking the medication. Please advise. CB# [phone].

## 2017-11-20 ENCOUNTER — Other Ambulatory Visit (INDEPENDENT_AMBULATORY_CARE_PROVIDER_SITE_OTHER): Payer: BC Managed Care – PPO

## 2017-11-20 ENCOUNTER — Encounter: Payer: Self-pay | Admitting: Family Medicine

## 2017-11-20 ENCOUNTER — Telehealth: Payer: Self-pay | Admitting: Family Medicine

## 2017-11-20 ENCOUNTER — Telehealth (INDEPENDENT_AMBULATORY_CARE_PROVIDER_SITE_OTHER): Payer: BC Managed Care – PPO | Admitting: Family Medicine

## 2017-11-20 DIAGNOSIS — D649 Anemia, unspecified: Secondary | ICD-10-CM

## 2017-11-20 DIAGNOSIS — E785 Hyperlipidemia, unspecified: Secondary | ICD-10-CM

## 2017-11-20 LAB — LIPID PANEL
Cholesterol: 225 mg/dL — ABNORMAL HIGH (ref 0–200)
HDL: 60.5 mg/dL (ref >39.00)
LDL Cholesterol: 154 mg/dL — ABNORMAL HIGH (ref 0–99)
NonHDL: 164.4
TRIGLYCERIDES: 53 mg/dL (ref 0.0–149.0)
Total CHOL/HDL Ratio: 4
VLDL: 10.6 mg/dL (ref 0.0–40.0)

## 2017-11-20 LAB — COMPREHENSIVE METABOLIC PANEL
ALK PHOS: 47 U/L (ref 39–117)
ALT: 10 U/L (ref 0–35)
AST: 17 U/L (ref 0–37)
Albumin: 4.4 g/dL (ref 3.5–5.2)
BILIRUBIN TOTAL: 0.8 mg/dL (ref 0.2–1.2)
BUN: 10 mg/dL (ref 6–23)
CALCIUM: 9.5 mg/dL (ref 8.4–10.5)
CO2: 27 mEq/L (ref 19–32)
Chloride: 105 mEq/L (ref 96–112)
Creatinine, Ser: 1.04 mg/dL (ref 0.40–1.20)
GFR: 75.36 mL/min (ref >60.00)
Glucose, Bld: 85 mg/dL (ref 70–99)
Potassium: 4.3 mEq/L (ref 3.5–5.1)
Sodium: 140 mEq/L (ref 135–145)
TOTAL PROTEIN: 7.5 g/dL (ref 6.0–8.3)

## 2017-11-20 LAB — CBC WITH DIFFERENTIAL/PLATELET
BASOS ABS: 0.1 10*3/uL (ref 0.0–0.1)
Basophils Relative: 2 % (ref 0.0–3.0)
Eosinophils Absolute: 0.1 10*3/uL (ref 0.0–0.7)
Eosinophils Relative: 1.4 % (ref 0.0–5.0)
HEMATOCRIT: 37.5 % (ref 36.0–46.0)
Hemoglobin: 12 g/dL (ref 12.0–15.0)
Lymphocytes Relative: 31.5 % (ref 12.0–46.0)
Lymphs Abs: 1.3 10*3/uL (ref 0.7–4.0)
MCHC: 32.1 g/dL (ref 30.0–36.0)
MCV: 85 fl (ref 78.0–100.0)
MONOS PCT: 6.6 % (ref 3.0–12.0)
Monocytes Absolute: 0.3 10*3/uL (ref 0.1–1.0)
NEUTROS ABS: 2.4 10*3/uL (ref 1.4–7.7)
Neutrophils Relative %: 58.5 % (ref 43.0–77.0)
Platelets: 271 10*3/uL (ref 150.0–400.0)
RBC: 4.41 Mil/uL (ref 3.87–5.11)
RDW: 13.8 % (ref 11.5–15.5)
WBC: 4.1 10*3/uL (ref 4.0–10.5)

## 2017-11-20 NOTE — Telephone Encounter (Signed)
-----   Message from Ellamae Sia sent at 11/12/2017 11:04 AM EDT ----- Regarding: Lab orders for Wednesday, 8.28.19 Patient is scheduled for CPX labs, please order future labs, Thanks , Karna Christmas

## 2017-11-28 ENCOUNTER — Ambulatory Visit (INDEPENDENT_AMBULATORY_CARE_PROVIDER_SITE_OTHER): Payer: BC Managed Care – PPO | Admitting: Family Medicine

## 2017-11-28 ENCOUNTER — Encounter: Payer: Self-pay | Admitting: Family Medicine

## 2017-11-28 VITALS — BP 100/62 | HR 60 | Temp 98.6°F | Ht 68.5 in | Wt 274.2 lb

## 2017-11-28 DIAGNOSIS — Z0000 Encounter for general adult medical examination without abnormal findings: Secondary | ICD-10-CM | POA: Diagnosis not present

## 2017-11-28 DIAGNOSIS — J4540 Moderate persistent asthma, uncomplicated: Secondary | ICD-10-CM | POA: Diagnosis not present

## 2017-11-28 DIAGNOSIS — E785 Hyperlipidemia, unspecified: Secondary | ICD-10-CM | POA: Diagnosis not present

## 2017-11-28 NOTE — Progress Notes (Signed)
Subjective:    Patient ID: Brittany [name], female    DOB: 06-03-1975, 42 y.o.   MRN: [id number]  HPI The patient is here for annual wellness exam and preventative care.   She has been under a lot of stress lately.  She  Is going through a divorce.  Handling well overall. Sleeping well at night.   Moderate persistent asthma :  She has not had a flares in last year, no night time cough.   Good control on Qvar and albuterol prn.   PCOS  No longer on metformin.  Wt Readings from Last 3 Encounters:  11/28/17 274 lb 4 oz (124.4 kg)  10/23/17 274 lb 8 oz (124.5 kg)  11/02/16 252 lb 8 oz (114.5 kg)  Body mass index is 41.09 kg/m.   Elevated Cholesterol: Worsened control Using medications without problems: Muscle aches:  Diet compliance:  Not eating as well. Exercise: torn meniscus.. Walking a mile a day Other complaints:   Social History /Family History/Past Medical History reviewed in detail and updated in EMR if needed. Blood pressure 100/62, pulse 60, temperature 98.6 F (37 C), temperature source Oral, height 5' 8.5" (1.74 m), weight 274 lb 4 oz (124.4 kg), last menstrual period 11/19/2017.   Review of Systems  Constitutional: Negative for fatigue and fever.  HENT: Negative for congestion.   Eyes: Negative for pain.  Respiratory: Negative for cough and shortness of breath.   Cardiovascular: Negative for chest pain, palpitations and leg swelling.  Gastrointestinal: Negative for abdominal pain.  Genitourinary: Negative for dysuria and vaginal bleeding.  Musculoskeletal: Negative for back pain.  Neurological: Negative for syncope, light-headedness and headaches.  Psychiatric/Behavioral: Negative for dysphoric mood.       Objective:   Physical Exam  Constitutional: Vital signs are normal. She appears well-developed and well-nourished. She is cooperative.  Non-toxic appearance. She does not appear ill. No distress.  Morbid obesity  HENT:  Head: Normocephalic.  Right  Ear: Hearing, tympanic membrane, external ear and ear canal normal.  Left Ear: Hearing, tympanic membrane, external ear and ear canal normal.  Nose: Nose normal.  Eyes: Pupils are equal, round, and reactive to light. Conjunctivae, EOM and lids are normal. Lids are everted and swept, no foreign bodies found.  Neck: Trachea normal and normal range of motion. Neck supple. Carotid bruit is not present. No thyroid mass and no thyromegaly present.  Cardiovascular: Normal rate, regular rhythm, S1 normal, S2 normal, normal heart sounds and intact distal pulses. Exam reveals no gallop.  No murmur heard. Pulmonary/Chest: Effort normal and breath sounds normal. No respiratory distress. She has no wheezes. She has no rhonchi. She has no rales.  Abdominal: Soft. Normal appearance and bowel sounds are normal. She exhibits no distension, no fluid wave, no abdominal bruit and no mass. There is no hepatosplenomegaly. There is no tenderness. There is no rebound, no guarding and no CVA tenderness. No hernia.  Lymphadenopathy:    She has no cervical adenopathy.    She has no axillary adenopathy.  Neurological: She is alert. She has normal strength. No cranial nerve deficit or sensory deficit.  Skin: Skin is warm, dry and intact. No rash noted.  Psychiatric: Her speech is normal and behavior is normal. Judgment normal. Her mood appears not anxious. Cognition and memory are normal. She does not exhibit a depressed mood.          Assessment & Plan:  The patient's preventative maintenance and recommended screening tests for an annual wellness exam  were reviewed in full today. Brought up to date unless services declined.  Counselled on the importance of diet, exercise, and its role in overall health and mortality. The patient's FH and SH was reviewed, including their home life, tobacco status, and drug and alcohol status.   Vaccines: due for tdap and flu Pap/DVE:  GYN 09/2017 Mammo: no early family history of  breast cancer Colon: no early family history of colon cancer Smoking Status: ETOH/ drug XBM:[id number]   HIV screen:

## 2017-11-28 NOTE — Patient Instructions (Addendum)
Get back to exercise.. Consider water exercise.  Work on low cholesterol and low fat diet.  Schedule repeat fasting labs in 3-6 months for cholesterol. Red yeast rice 600 mg 2 tabs twice daily.

## 2017-12-09 ENCOUNTER — Other Ambulatory Visit: Payer: Self-pay | Admitting: Family Medicine

## 2017-12-09 NOTE — Telephone Encounter (Signed)
Electronic refill request Last refill 10/31/17 #30 Last office visit 11/28/17

## 2017-12-11 NOTE — Assessment & Plan Note (Signed)
Encouraged exercise, weight loss, healthy eating habits.

## 2017-12-11 NOTE — Assessment & Plan Note (Signed)
Inadequate control. Reviewed low cholesterol diet and lifestyle changes in detail. Plan assembled for change.

## 2017-12-11 NOTE — Assessment & Plan Note (Signed)
Great control on Qvar.

## 2017-12-13 ENCOUNTER — Encounter: Payer: Self-pay | Admitting: Family Medicine

## 2017-12-18 ENCOUNTER — Telehealth: Payer: Self-pay | Admitting: *Deleted

## 2017-12-18 NOTE — Telephone Encounter (Signed)
See instructions from 9/5 OV:  Get back to exercise.. Consider water exercise.  Work on low cholesterol and low fat diet.  Start Red yeast rice 600 mg 2 tabs twice daily.  Schedule repeat fasting labs in 3-6 months for cholesterol.  Remind pt of above instructions.  She can schedule lab only appt... If she has no preference and is actively making changes.. I would do in 3 months from 9/5 OV (in 02/2018) (Yes needs to be fasting)

## 2017-12-18 NOTE — Telephone Encounter (Signed)
Brittany [name] notified as instructed by telephone.  Lab appointment scheduled for 02/19/2018 at 9:00 am for fasting Lipid.

## 2017-12-18 NOTE — Telephone Encounter (Signed)
Copied from [place] [id number]. Topic: Appointment Scheduling - Scheduling Inquiry for Clinic >> Dec 18, 2017  8:45 AM Reyne Dumas L wrote: Reason for CRM:   Pt states she believes she is supposed to have another lab appointment to follow up on cholesterol levels.  Pt wants to know when she can schedule those and she wants to know if these need to be fasting labs. Pt can be reached at [phone].

## 2017-12-19 ENCOUNTER — Telehealth: Payer: Self-pay | Admitting: Family Medicine

## 2017-12-19 NOTE — Telephone Encounter (Signed)
Copied from [place] [id number]. Topic: Quick Communication - See Telephone Encounter >> Dec 19, 2017  9:09 AM Sheran Luz wrote: CRM for notification. See Telephone encounter for: 12/19/17.  Pt is requesting a call back from RN or Dr. Diona Browner to discuss some of her medications. Pt states that she would like to know the long term effects of trying to ween off of diclofenac (VOLTAREN) 75 MG EC tablet. Pt also inquired about the dose of red yeast rice that she should be taking. Please advise.

## 2017-12-19 NOTE — Telephone Encounter (Signed)
Please let pt know:  Red yeast rice 600 mg 2 tab twice daily .Marland Kitchen Can start low and increase to that dose.  No SE of coming off diclofenac.. Just stop. Only issue would be pain that it was treating returning if still present.

## 2017-12-19 NOTE — Telephone Encounter (Signed)
Brittany [name] notified as instructed by telephone.  Brittany [name] was more concerned about what the long term effects are from being on the Diclofenac.  She states when she does try to stop it, the knee pain comes back.  She is also wanting to know if Dr. Diona Browner thinks it is okay for her to take [place].  It is suppose to help with fiber and lowering cholesterol.  She states she drinks a lot of water and it says that it expands a lot with water.  Please advise.

## 2017-12-20 NOTE — Telephone Encounter (Signed)
Long term issues of diclofenac or any NSAID like ibuprofen are stomach irritation, possible ulcers and reflux as well as increase risk of cardiovascular disease and kidney issues. Best to use only for flares.   Glucommanan is fine.. Also rec the red yeast rice.

## 2017-12-23 NOTE — Telephone Encounter (Signed)
Jakaiya notified as instructed by telephone.  Patient states understanding.

## 2017-12-24 ENCOUNTER — Telehealth: Payer: Self-pay | Admitting: Family Medicine

## 2017-12-24 ENCOUNTER — Telehealth: Payer: Self-pay | Admitting: Gastroenterology

## 2017-12-24 ENCOUNTER — Other Ambulatory Visit: Payer: Self-pay | Admitting: Gastroenterology

## 2017-12-24 NOTE — Telephone Encounter (Signed)
Noted  

## 2017-12-24 NOTE — Telephone Encounter (Signed)
See phone note from GI.  Let me know if I need to still call patient.

## 2017-12-24 NOTE — Telephone Encounter (Signed)
Please contact pt and get an update. Is she just letting us know trhat she is calling GI? Or does she need something else?

## 2017-12-24 NOTE — Telephone Encounter (Signed)
Patient has complaints of "spasms" and difficulty moving her bowels sufficiently. She does not feel emptied. Admits to a recent diet change to have "a lot of fiber." States she is "trying to lower my cholesterol." She states she has had this problem before "but not like this." Discussed importance of increasing water intake with her increase of dietary fiber. She will try a purge or magnesium Citrate half a bottle tonight. Call us if she fails to improve.

## 2017-12-24 NOTE — Telephone Encounter (Signed)
Copied from [place] [phone]. Topic: Quick Communication - See Telephone Encounter >> Dec 24, 2017 11:16 AM Brittany [name] R wrote: Patient called in and stated she is having a flare up from her diverticulitis. She is calling GI doctor.

## 2018-01-04 ENCOUNTER — Other Ambulatory Visit: Payer: Self-pay | Admitting: Family Medicine

## 2018-01-05 NOTE — Telephone Encounter (Signed)
Last office visit 11/28/17 for CPE.  Last refilled 12/10/17 for #30 with no refills.

## 2018-01-06 ENCOUNTER — Other Ambulatory Visit: Payer: Self-pay | Admitting: Family Medicine

## 2018-01-20 ENCOUNTER — Telehealth: Payer: Self-pay | Admitting: Family Medicine

## 2018-01-20 ENCOUNTER — Encounter: Payer: Self-pay | Admitting: *Deleted

## 2018-01-20 NOTE — Telephone Encounter (Signed)
Okay to write note as request by pt. For light walking given knee pain.

## 2018-01-20 NOTE — Telephone Encounter (Signed)
Katrena notified letter is ready to be picked up at the front desk.  Letter also sent to MyChart.

## 2018-01-20 NOTE — Telephone Encounter (Addendum)
Copied from [place] [phone]. Topic: General - Other >> Jan 20, 2018 10:40 AM Lennox Solders wrote: Reason for CRM: pt needs on [place] letterhead that she has tear in left knee and that she can only do light walking. Pt will return to regular exercise when she is heal. Pt saw dr copland in July 2019. Pt was feeling fine until yesterday she over did walking  FYI Pt will see md if she is no better in a week. Pt is putting ice etc on knee

## 2018-01-21 ENCOUNTER — Other Ambulatory Visit: Payer: Self-pay | Admitting: Family Medicine

## 2018-01-21 NOTE — Telephone Encounter (Signed)
Last office visit 11/28/2017 for CPE.  Last refilled 01/07/2018 for #30 with no refills.  Ok to refill?

## 2018-02-18 ENCOUNTER — Telehealth: Payer: Self-pay | Admitting: Family Medicine

## 2018-02-18 DIAGNOSIS — D649 Anemia, unspecified: Secondary | ICD-10-CM

## 2018-02-18 DIAGNOSIS — E7800 Pure hypercholesterolemia, unspecified: Secondary | ICD-10-CM

## 2018-02-18 NOTE — Telephone Encounter (Signed)
-----   Message from Lendon Collar, RT sent at 02/10/2018  9:54 AM EST ----- Regarding: Lab orders for Wednesday 02/19/18 Please enter lipid lab orders for 02/19/18. Thanks!

## 2018-02-19 ENCOUNTER — Other Ambulatory Visit (INDEPENDENT_AMBULATORY_CARE_PROVIDER_SITE_OTHER): Payer: BC Managed Care – PPO

## 2018-02-19 DIAGNOSIS — E7800 Pure hypercholesterolemia, unspecified: Secondary | ICD-10-CM | POA: Diagnosis not present

## 2018-02-19 LAB — LIPID PANEL
CHOLESTEROL: 196 mg/dL (ref 0–200)
HDL: 64.4 mg/dL (ref >39.00)
LDL Cholesterol: 124 mg/dL — ABNORMAL HIGH (ref 0–99)
NonHDL: 131.78
Total CHOL/HDL Ratio: 3
Triglycerides: 38 mg/dL (ref 0.0–149.0)
VLDL: 7.6 mg/dL (ref 0.0–40.0)

## 2018-03-03 ENCOUNTER — Other Ambulatory Visit: Payer: Self-pay | Admitting: *Deleted

## 2018-03-03 MED ORDER — ALBUTEROL SULFATE HFA 108 (90 BASE) MCG/ACT IN AERS: 2.0000 | INHALATION_SPRAY | Freq: Four times a day (QID) | RESPIRATORY_TRACT | 2 refills | Status: DC | PRN

## 2018-04-02 ENCOUNTER — Other Ambulatory Visit: Payer: Self-pay | Admitting: Family Medicine

## 2018-04-02 NOTE — Telephone Encounter (Signed)
Last office visit 11/28/2017 for CPE.  Last refilled 01/21/2018 for #60 with 1 refill.  No future appointments.

## 2018-05-14 ENCOUNTER — Ambulatory Visit: Payer: BC Managed Care – PPO | Admitting: Family Medicine

## 2018-05-14 ENCOUNTER — Encounter: Payer: Self-pay | Admitting: Family Medicine

## 2018-05-14 ENCOUNTER — Telehealth: Payer: Self-pay | Admitting: *Deleted

## 2018-05-14 VITALS — BP 122/70 | HR 72 | Temp 98.9°F | Ht 68.5 in

## 2018-05-14 DIAGNOSIS — J029 Acute pharyngitis, unspecified: Secondary | ICD-10-CM | POA: Diagnosis not present

## 2018-05-14 DIAGNOSIS — J069 Acute upper respiratory infection, unspecified: Secondary | ICD-10-CM | POA: Diagnosis not present

## 2018-05-14 DIAGNOSIS — J4540 Moderate persistent asthma, uncomplicated: Secondary | ICD-10-CM

## 2018-05-14 DIAGNOSIS — B9789 Other viral agents as the cause of diseases classified elsewhere: Secondary | ICD-10-CM | POA: Diagnosis not present

## 2018-05-14 LAB — POCT RAPID STREP A (OFFICE): Rapid Strep A Screen: NEGATIVE

## 2018-05-14 NOTE — Patient Instructions (Addendum)
For cough and congestion - I like mucinex DM or robitussin DM Lots of fluids For sore throat - salt water gargle/ tylenol or ibuprofen help  nyquil is an alternative for night time   Get rest when you can  Watch for fever over 101 Watch for severe sore throat  Watch for severe cough or trouble breathing  Continue your asthma protocol   We will send the throat culture   Update if not starting to improve in a week or if worsening

## 2018-05-14 NOTE — Assessment & Plan Note (Signed)
With nasal congestion /pnd and cough  Reassuring exam RST neg (pt req throat cx as well due to severity of sore throat and exp) No fever or body aches Symptom care discussed  Analgesics/fluids/rest  guaifen-DM or nyquil prn  Update if not starting to improve in a week or if worsening    Enc her to get a flu shot soon as well

## 2018-05-14 NOTE — Progress Notes (Signed)
Subjective:    Patient ID: Brittany [name], female    DOB: March 06, 1976, 43 y.o.   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 Viral URI with cough 05/14/2018  . Sore throat 05/14/2018  . Dizziness 11/02/2016  . Moderate persistent asthma in adult without complication 38/12/1749  . Acid reflux 04/09/2015  . Morbid obesity ([place]) 11/26/2014  . PCOS (polycystic ovarian syndrome) 11/26/2014  . ADD (attention deficit disorder) 09/10/2013  . Anemia 09/10/2013  . Leukocytopenia 09/10/2013  . Hyperlipidemia 11/26/2006  . OBESITY 11/26/2006   Past Medical History:  Diagnosis Date  . Asthma   . Heart murmur   . PCOS (polycystic ovarian syndrome)    Past Surgical History:  Procedure Laterality Date  . NO PAST SURGERIES     Social History   Tobacco Use  . Smoking status: Never Smoker  . Smokeless tobacco: Never Used  Substance Use Topics  . Alcohol use: Yes    Alcohol/week: 0.0 standard drinks    Comment: 1 drink every 2 months   . Drug use: No   Family History  Problem Relation Age of Onset  . Ulcerative colitis Sister   . Hyperlipidemia Mother   . Hypertension Brother        constrolled with diet  . Heart attack  Paternal Grandfather   . Diabetes Maternal Aunt   . Colon polyps Maternal Aunt   . Heart attack Maternal Aunt   . Colon polyps Maternal Aunt   . Cancer Paternal Aunt        ? type   Allergies  Allergen Reactions  . Iodine     REACTION: Itching in throat  . Shellfish Allergy     REACTION: GI issues; itching  . Other Nausea And Vomiting    Flax seed    Current Outpatient Medications on File Prior to Visit  Medication Sig Dispense Refill  . albuterol (PROAIR HFA) 108 (90 Base) MCG/ACT inhaler Inhale 2 puffs into the lungs every 6 (six) hours as needed for wheezing or shortness of breath. 18 g 2  . albuterol (PROVENTIL) (2.5 MG/3ML) 0.083% nebulizer solution Take 3 mLs (2.5 mg total) by nebulization every 6 (six) hours as needed for wheezing or shortness of breath. 150 mL 1  . ALPRAZolam (XANAX) 0.25 MG tablet Take 1-2 tablets (0.25-0.5 mg total) by mouth daily as needed for anxiety (prn plane flight). 6 tablet 0  . beclomethasone (QVAR REDIHALER) 40 MCG/ACT inhaler Inhale 2 puffs into the lungs 2 (two)  times daily. 10.6 g 5  . diclofenac (VOLTAREN) 75 MG EC tablet TAKE 1 TABLET BY MOUTH TWICE A DAY 60 tablet 1  . EPIPEN 2-PAK 0.3 MG/0.3ML SOAJ injection INJECT AS DIRECTED 2 Device 0  . ketorolac (TORADOL) 10 MG tablet Take 10 mg by mouth 3 (three) times daily as needed.  0  . metFORMIN (GLUCOPHAGE) 1000 MG tablet Take 1,000 mg by mouth 2 (two) times daily.  5  . naproxen sodium (ANAPROX) 550 MG tablet Reported on 04/09/2015  0   No current facility-administered medications on file prior to visit.      Review of Systems  Constitutional: Positive for appetite change and fatigue. Negative for fever.  HENT: Positive for congestion, postnasal drip, rhinorrhea, sinus pressure, sneezing and sore throat. Negative for ear pain and voice change.   Eyes: Negative for pain and discharge.  Respiratory: Positive for cough. Negative for chest tightness, shortness of breath, wheezing and stridor.     Cardiovascular: Negative for chest pain.  Gastrointestinal: Negative for diarrhea, nausea and vomiting.  Genitourinary: Negative for frequency, hematuria and urgency.  Musculoskeletal: Negative for arthralgias and myalgias.  Skin: Negative for rash.  Neurological: Positive for headaches. Negative for dizziness, weakness and light-headedness.  Psychiatric/Behavioral: Negative for confusion and dysphoric mood.       Objective:   Physical Exam Constitutional:      General: She is not in acute distress.    Appearance: Normal appearance. She is well-developed. She is obese. She is not ill-appearing, toxic-appearing or diaphoretic.  HENT:     Head: Normocephalic and atraumatic.     Comments: Nares are injected and congested      Right Ear: Tympanic membrane, ear canal and external ear normal.     Left Ear: Tympanic membrane, ear canal and external ear normal.     Nose: Congestion and rhinorrhea present.     Mouth/Throat:     Mouth: Mucous membranes are moist.     Pharynx: Oropharynx is clear. No oropharyngeal exudate or posterior oropharyngeal erythema.     Comments: Clear pnd noted  Mild injection of posterior throat  No swelling or exudate  Eyes:     General:        Right eye: No discharge.        Left eye: No discharge.     Conjunctiva/sclera: Conjunctivae normal.     Pupils: Pupils are equal, round, and reactive to light.  Neck:     Musculoskeletal: Normal range of motion and neck supple.  Cardiovascular:     Rate and Rhythm: Normal rate and regular rhythm.     Heart sounds: Normal heart sounds.  Pulmonary:     Effort: Pulmonary effort is normal. No respiratory distress.     Breath sounds: Normal breath sounds. No stridor. No wheezing, rhonchi or rales.     Comments: Good air exch No wheeze No rales or rhonchi  Chest:     Chest wall: No tenderness.  Lymphadenopathy:     Cervical: No cervical adenopathy.  Skin:    General: Skin is warm and dry.     Capillary Refill:  Capillary refill takes less than 2 seconds.     Findings: No rash.  Neurological:     Mental Status: She is alert.     Cranial Nerves: No cranial nerve deficit.  Psychiatric:        Mood and Affect: Mood normal.           Assessment & Plan:  Problem List Items Addressed This Visit      Respiratory   Viral URI with cough - Primary    With nasal congestion /pnd and cough  Reassuring exam RST neg (pt req throat cx as well due to severity of sore throat and exp) No fever or body aches Symptom care discussed  Analgesics/fluids/rest  guaifen-DM or nyquil prn  Update if not starting to improve in a week or if worsening    Enc her to get a flu shot soon as well      Relevant Orders   Culture, Group A Strep     Other   Sore throat   Relevant Orders   Rapid Strep A (Completed)

## 2018-05-14 NOTE — Telephone Encounter (Signed)
Pt is seeing Dr. Glori Bickers for an acute illness and requested an order for a nebulizer. Pt said when she is sick her asthma flares up and she is using the inhalers but she never received a nebulizer machine. Per Dr. Glori Bickers order needs to come from PCP since its a chronic issue. Please call pt once order is ready

## 2018-05-15 MED ORDER — ALBUTEROL SULFATE (2.5 MG/3ML) 0.083% IN NEBU: 2.5000 mg | INHALATION_SOLUTION | Freq: Four times a day (QID) | RESPIRATORY_TRACT | 1 refills | Status: DC | PRN

## 2018-05-15 NOTE — Telephone Encounter (Signed)
I sent in DME order for nebulizer. If she need albuterol neb refills... can send in 1 refill.

## 2018-05-15 NOTE — Telephone Encounter (Signed)
Brittany [name] notified by telephone that her DME order for nebulizer machine in ready to be picked up at the front desk.  Refill on albuterol solution sent to [facility] on [address] as patient requested.

## 2018-05-15 NOTE — Addendum Note (Signed)
Addended by: Carter Kitten on: 05/15/2018 10:39 AM   Modules accepted: Orders

## 2018-05-16 LAB — CULTURE, GROUP A STREP
MICRO NUMBER: 215527
SPECIMEN QUALITY: ADEQUATE

## 2018-05-19 ENCOUNTER — Telehealth: Payer: Self-pay | Admitting: Family Medicine

## 2018-05-19 NOTE — Telephone Encounter (Signed)
Pt called to set up 3 mo lipid labs for tomorrow, Tues 2/25- she is off work. She needs orders for 9am lab appt.

## 2018-05-20 ENCOUNTER — Other Ambulatory Visit (INDEPENDENT_AMBULATORY_CARE_PROVIDER_SITE_OTHER): Payer: BC Managed Care – PPO

## 2018-05-20 ENCOUNTER — Telehealth: Payer: Self-pay | Admitting: Family Medicine

## 2018-05-20 DIAGNOSIS — E785 Hyperlipidemia, unspecified: Secondary | ICD-10-CM

## 2018-05-20 DIAGNOSIS — D649 Anemia, unspecified: Secondary | ICD-10-CM

## 2018-05-20 LAB — LIPID PANEL
Cholesterol: 198 mg/dL (ref 0–200)
HDL: 60.2 mg/dL (ref >39.00)
LDL Cholesterol: 128 mg/dL — ABNORMAL HIGH (ref 0–99)
NonHDL: 137.73
Total CHOL/HDL Ratio: 3
Triglycerides: 50 mg/dL (ref 0.0–149.0)
VLDL: 10 mg/dL (ref 0.0–40.0)

## 2018-05-20 LAB — COMPREHENSIVE METABOLIC PANEL
ALBUMIN: 4.2 g/dL (ref 3.5–5.2)
ALK PHOS: 46 U/L (ref 39–117)
ALT: 13 U/L (ref 0–35)
AST: 16 U/L (ref 0–37)
BILIRUBIN TOTAL: 0.6 mg/dL (ref 0.2–1.2)
BUN: 11 mg/dL (ref 6–23)
CALCIUM: 9 mg/dL (ref 8.4–10.5)
CHLORIDE: 105 meq/L (ref 96–112)
CO2: 27 mEq/L (ref 19–32)
CREATININE: 0.92 mg/dL (ref 0.40–1.20)
GFR: 81.48 mL/min (ref >60.00)
Glucose, Bld: 88 mg/dL (ref 70–99)
Potassium: 4 mEq/L (ref 3.5–5.1)
Sodium: 139 mEq/L (ref 135–145)
TOTAL PROTEIN: 7 g/dL (ref 6.0–8.3)

## 2018-05-20 NOTE — Telephone Encounter (Signed)
-----   Message from Ellamae Sia sent at 05/20/2018  8:36 AM EST ----- Regarding: Lab orders for today Lab orders for a 3 month follow up appt.

## 2018-07-09 ENCOUNTER — Encounter: Payer: Self-pay | Admitting: Family Medicine

## 2018-07-09 ENCOUNTER — Ambulatory Visit (INDEPENDENT_AMBULATORY_CARE_PROVIDER_SITE_OTHER): Payer: BC Managed Care – PPO | Admitting: Family Medicine

## 2018-07-09 VITALS — HR 85 | Temp 96.5°F | Ht 68.5 in

## 2018-07-09 DIAGNOSIS — J4541 Moderate persistent asthma with (acute) exacerbation: Secondary | ICD-10-CM | POA: Diagnosis not present

## 2018-07-09 MED ORDER — MONTELUKAST SODIUM 10 MG PO TABS: 10.0000 mg | ORAL_TABLET | Freq: Every day | ORAL | 3 refills | Status: DC

## 2018-07-09 NOTE — Progress Notes (Signed)
Roth Ress T. Yobana Culliton, MD Primary Care and [place] [facility] [place], [address] Phone: [phone]  FAX: [place] - 43 y.o. female  MRN [id number]  Date of Birth: 11-12-1975  Visit Date: 07/09/2018  PCP: [name], MD  Referred by: [name], MD Chief Complaint  Patient presents with  . Asthma    Wants Symbicort Inhaler    Virtual Visit via Video Note:  I connected with  AGNIESZKA NEWHOUSE on 07/09/2018  8:40 AM EDT by a video enabled telemedicine application and verified that I am speaking with the correct person using two identifiers.   Location patient: home computer, tablet, or smartphone Location provider: work or home office Consent: Verbal consent directly obtained from [employer]. Persons participating in the virtual visit: patient, provider  I discussed the limitations of evaluation and management by telemedicine and the availability of in person appointments. The patient expressed understanding and agreed to proceed.  History of Present Illness:  Asthma - trouble to breathing at the gym. Walking 4-5 times a week. Trouble breathing.   Well-known patient.  She has been having some worsened breathing over the last 2 to 3 weeks with the increased pollen burning.  She has noted of this moderate when she is walking in which she has been doing some long grocery store trips.  At that time, she is also been using 1-2 masks.  She does not use any allergy medication.  Albuterol use is been at least 4 times a day, a few x6 times per day since that time.  Baseline she does use Qvar twice a day.  Review of Systems as above: See pertinent positives and pertinent negatives per HPI No acute distress verbally  Past Medical History, Surgical History, Social History, Family History, Problem List, Medications, and Allergies have been reviewed and updated if relevant.    Observations/Objective/Exam:  An attempt was made to discern vital signs over the phone and per patient if applicable and possible.   General:    Alert, Oriented, appears well and in no acute distress HEENT:     Atraumatic, conjunctiva clear, no obvious abnormalities on inspection of external nose and ears.  Neck:    Normal movements of the head and neck Pulmonary:     On inspection no signs of respiratory distress, breathing rate appears normal, no obvious gross SOB, gasping or wheezing Cardiovascular:    No obvious cyanosis Musculoskeletal:    Moves all visible extremities without noticeable abnormality Psych / Neurological:     Pleasant and cooperative, no obvious depression or anxiety, speech and thought processing grossly intact  Assessment and Plan:  Asthma in adult, moderate persistent, with acute exacerbation  Add Singulair, if this does not help in 5 to 7 days, add in over-the-counter Zyrtec.  If she still having difficulties in 10 days, she should call him, and consideration of changing to Symbicort or equivalent would be an appropriate next step.  I discussed the assessment and treatment plan with the patient. The patient was provided an opportunity to ask questions and all were answered. The patient agreed with the plan and demonstrated an understanding of the instructions.   The patient was advised to call back or seek an in-person evaluation if the symptoms worsen or if the condition fails to improve as anticipated.  Follow-up: prn unless noted otherwise below No follow-ups on file.  Meds ordered this encounter  Medications  . montelukast (SINGULAIR) 10 MG tablet    Sig: Take 1 tablet (10 mg total) by mouth at bedtime.    Dispense:  30 tablet    Refill:  3   No orders of the defined types were placed in this encounter.   Signed,  Maud Deed. Corynn Solberg, MD

## 2018-07-23 ENCOUNTER — Other Ambulatory Visit: Payer: Self-pay | Admitting: Family Medicine

## 2018-08-21 ENCOUNTER — Other Ambulatory Visit: Payer: Self-pay | Admitting: Family Medicine

## 2018-09-08 ENCOUNTER — Ambulatory Visit: Payer: BC Managed Care – PPO | Admitting: Family Medicine

## 2018-09-08 ENCOUNTER — Ambulatory Visit (INDEPENDENT_AMBULATORY_CARE_PROVIDER_SITE_OTHER)
Admission: RE | Admit: 2018-09-08 | Discharge: 2018-09-08 | Disposition: A | Discharge status: Home / Self Care | Payer: BC Managed Care – PPO | Source: Ambulatory Visit | Attending: Family Medicine | Admitting: Family Medicine

## 2018-09-08 ENCOUNTER — Encounter: Payer: Self-pay | Admitting: Family Medicine

## 2018-09-08 ENCOUNTER — Other Ambulatory Visit: Payer: Self-pay

## 2018-09-08 VITALS — BP 118/52 | HR 60 | Temp 97.9°F | Ht 68.5 in

## 2018-09-08 DIAGNOSIS — M542 Cervicalgia: Secondary | ICD-10-CM

## 2018-09-08 DIAGNOSIS — M25511 Pain in right shoulder: Secondary | ICD-10-CM

## 2018-09-08 DIAGNOSIS — M25512 Pain in left shoulder: Secondary | ICD-10-CM

## 2018-09-08 DIAGNOSIS — M545 Low back pain, unspecified: Secondary | ICD-10-CM
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
## 2018-09-09 ENCOUNTER — Encounter: Payer: Self-pay | Admitting: Family Medicine

## 2018-09-19 DIAGNOSIS — R87619 Unspecified abnormal cytological findings in specimens from cervix uteri: Secondary | ICD-10-CM | POA: Insufficient documentation

## 2018-09-30 ENCOUNTER — Other Ambulatory Visit: Payer: Self-pay | Admitting: Family Medicine

## 2018-10-07 ENCOUNTER — Telehealth: Payer: Self-pay

## 2018-10-07 DIAGNOSIS — M545 Low back pain, unspecified: Secondary | ICD-10-CM

## 2018-10-07 DIAGNOSIS — M542 Cervicalgia: Secondary | ICD-10-CM

## 2018-10-07 DIAGNOSIS — M25512 Pain in left shoulder: Secondary | ICD-10-CM

## 2018-10-07 DIAGNOSIS — M25511 Pain in right shoulder: Secondary | ICD-10-CM

## 2018-10-07 NOTE — Telephone Encounter (Signed)
Pt last seen on 09/08/58 and it was noted if pain worsens would do PT before FU. Pt has had lower back pain on and off for 2 wks(no UTI symptoms) but today no back pain but pain in neck shoulder area.now pain level is a 3 but discomfort level is 7-8. Pt request cb if there is something she should be doing or something that she should not be doing. Pt request cb.
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
## 2018-10-08 NOTE — Telephone Encounter (Signed)
I recommend formal physical therapy.  Let me know if she agrees to this plan of care.

## 2018-10-08 NOTE — Telephone Encounter (Signed)
Patent notified as instructed by telephone and verbalized understanding.
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
## 2018-10-20 ENCOUNTER — Other Ambulatory Visit: Payer: Self-pay | Admitting: Obstetrics and Gynecology

## 2018-10-20 DIAGNOSIS — Z1231 Encounter for screening mammogram for malignant neoplasm of breast: Secondary | ICD-10-CM

## 2018-10-23 LAB — HM PAP SMEAR: HM Pap smear: NEGATIVE

## 2018-10-28 LAB — HM MAMMOGRAPHY

## 2018-11-23 ENCOUNTER — Telehealth: Payer: Self-pay | Admitting: Family Medicine

## 2018-11-24 NOTE — Telephone Encounter (Signed)
Please schedule CPE with fasting labs with Dr. Diona Browner for sometime after September.

## 2018-11-28 NOTE — Telephone Encounter (Signed)
Labs10/22 cpx 10/27 Pt aware

## 2018-12-03 ENCOUNTER — Ambulatory Visit: Payer: BC Managed Care – PPO

## 2018-12-12 ENCOUNTER — Other Ambulatory Visit: Payer: Self-pay

## 2018-12-12 ENCOUNTER — Telehealth: Payer: Self-pay

## 2018-12-12 ENCOUNTER — Encounter: Payer: Self-pay | Admitting: Internal Medicine

## 2018-12-12 ENCOUNTER — Ambulatory Visit: Payer: BC Managed Care – PPO | Admitting: Internal Medicine

## 2018-12-12 VITALS — BP 104/82 | HR 72 | Temp 97.6°F | Ht 68.5 in | Wt 275.0 lb

## 2018-12-12 DIAGNOSIS — R002 Palpitations: Secondary | ICD-10-CM | POA: Diagnosis not present

## 2018-12-12 DIAGNOSIS — K529 Noninfective gastroenteritis and colitis, unspecified: Secondary | ICD-10-CM | POA: Insufficient documentation

## 2018-12-12 DIAGNOSIS — E785 Hyperlipidemia, unspecified: Secondary | ICD-10-CM

## 2018-12-12 LAB — COMPREHENSIVE METABOLIC PANEL
ALT: 7 U/L (ref 0–35)
AST: 13 U/L (ref 0–37)
Albumin: 4.3 g/dL (ref 3.5–5.2)
Alkaline Phosphatase: 46 U/L (ref 39–117)
BUN: 9 mg/dL (ref 6–23)
CO2: 22 mEq/L (ref 19–32)
Calcium: 9.3 mg/dL (ref 8.4–10.5)
Chloride: 105 mEq/L (ref 96–112)
Creatinine, Ser: 1.01 mg/dL (ref 0.40–1.20)
GFR: 72.96 mL/min (ref >60.00)
Glucose, Bld: 77 mg/dL (ref 70–99)
Potassium: 4.1 mEq/L (ref 3.5–5.1)
Sodium: 139 mEq/L (ref 135–145)
Total Bilirubin: 0.6 mg/dL (ref 0.2–1.2)
Total Protein: 7.3 g/dL (ref 6.0–8.3)

## 2018-12-12 LAB — LIPID PANEL
Cholesterol: 230 mg/dL — ABNORMAL HIGH (ref 0–200)
HDL: 52 mg/dL (ref >39.00)
LDL Cholesterol: 163 mg/dL — ABNORMAL HIGH (ref 0–99)
NonHDL: 178.09
Total CHOL/HDL Ratio: 4
Triglycerides: 73 mg/dL (ref 0.0–149.0)
VLDL: 14.6 mg/dL (ref 0.0–40.0)

## 2018-12-12 LAB — CBC
HCT: 36 % (ref 36.0–46.0)
Hemoglobin: 11.7 g/dL — ABNORMAL LOW (ref 12.0–15.0)
MCHC: 32.4 g/dL (ref 30.0–36.0)
MCV: 84.1 fl (ref 78.0–100.0)
Platelets: 279 10*3/uL (ref 150.0–400.0)
RBC: 4.28 Mil/uL (ref 3.87–5.11)
RDW: 13.5 % (ref 11.5–15.5)
WBC: 4.5 10*3/uL (ref 4.0–10.5)

## 2018-12-12 LAB — T4, FREE: Free T4: 1.08 ng/dL (ref 0.60–1.60)

## 2018-12-12 NOTE — Telephone Encounter (Signed)
Pt calling because staring last night pt can hear her heartbeat in her ears on and off.No CP,SOB,H/A or dizziness. Advised pt sometimes can be pressure in ear against a blood vessel but pt is concerned it could be her BP; since going on birthcontrol pt worries about BPdue to occasional H/A snce on Birthcontrol pill; last H/A 4 wks ago.pt does not have way to ck BP now.pt has in office appt to see Dr Silvio Pate this morning at 9:15. Pt has no covid symptoms, no travel and no known exposure to + covid.

## 2018-12-12 NOTE — Assessment & Plan Note (Addendum)
Has chronic apparent tachycardia that awakens her frequently In looking at her monitor in 2015--suspicious for SVT Her current symptoms of sense of heart in her ears is different--and not clear tachycardia Will check labs EKG--sinus at 57. Normal axis and intervals. No hypertrophy or ischemic changes. Poor R wave progression V1-3 could be lead placement. Since 02/19/14, rate is slightly lower  Given the apparent frequent SVT spells, plus arm pain, I will set up cardiology evaluation

## 2018-12-12 NOTE — Addendum Note (Signed)
Addended by: Pilar Grammes on: 12/12/2018 12:22 PM   Modules accepted: Orders

## 2018-12-12 NOTE — Assessment & Plan Note (Signed)
Likely IBS but sister diagnosed with celiac Will just check labs

## 2018-12-12 NOTE — Progress Notes (Signed)
Subjective:    Patient ID: Brittany [name], female    DOB: 1975/10/03, 43 y.o.   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 albuterol (PROVENTIL) (2.5 MG/3ML) 0.083% nebulizer solution TAKE 3 MLS BY NEBULIZATION EVERY 6 (SIX) HOURS AS NEEDED FOR WHEEZING OR SHORTNESS OF BREATH. 150 mL 1  . albuterol (VENTOLIN HFA) 108 (90 Base) MCG/ACT inhaler TAKE 2 PUFFS BY MOUTH EVERY 6 HOURS AS NEEDED FOR WHEEZE OR SHORTNESS OF BREATH 18 Inhaler 5  . ALPRAZolam (XANAX) 0.25 MG tablet Take 1-2 tablets (0.25-0.5 mg total) by mouth daily as needed for anxiety (prn plane flight). 6 tablet 0  . beclomethasone (QVAR REDIHALER) 40 MCG/ACT inhaler Inhale 2 puffs into the lungs 2 (two) times daily. 10.6 g 5  . EPINEPHRINE 0.3 mg/0.3 mL IJ SOAJ injection INJECT AS DIRECTED 2 each 0  . metFORMIN (GLUCOPHAGE) 1000 MG tablet Take 1,000 mg by mouth 2 (two) times daily.  5  . montelukast (SINGULAIR) 10 MG tablet TAKE 1 TABLET BY MOUTH EVERYDAY AT BEDTIME 90 tablet 1  . Norethindrone-Ethinyl Estradiol-Fe Biphas (LO LOESTRIN FE) 1  MG-10 MCG / 10 MCG tablet Lo Loestrin Fe 1 mg-10 mcg (24)/10 mcg (2) tablet    . oxyCODONE (OXY IR/ROXICODONE) 5 MG immediate release tablet oxycodone 5 mg tablet     No current facility-administered medications on file prior to visit.     Allergies  Allergen Reactions  . Iodine     REACTION: Itching in throat  . Shellfish Allergy     REACTION: GI issues; itching  . Other Nausea And Vomiting    Flax seed     Past Medical History:  Diagnosis Date  . Asthma   . Heart murmur   . PCOS (polycystic ovarian syndrome)     Past Surgical History:  Procedure Laterality Date  . NO PAST SURGERIES      Family History  Problem Relation Age of Onset  . Ulcerative colitis Sister   . Hyperlipidemia Mother   . Hypertension Brother        constrolled with diet  . Heart attack Paternal Grandfather   . Diabetes Maternal Aunt   . Colon polyps Maternal Aunt   . Heart attack Maternal Aunt   . Colon polyps Maternal Aunt   . Cancer Paternal Aunt        ? type    Social History   Socioeconomic History  . Marital status: Married    Spouse name: Not on file  . Number of children: 2  . Years of education: Not  on file  . Highest education level: Not on file  Occupational History  . Occupation: Pharmacist, hospital  Social Needs  . Financial resource strain: Not on file  . Food insecurity    Worry: Not on file    Inability: Not on file  . Transportation needs    Medical: Not on file    Non-medical: Not on file  Tobacco Use  . Smoking status: Never Smoker  . Smokeless tobacco: Never Used  Substance and Sexual Activity  . Alcohol use: Yes    Alcohol/week: 0.0 standard drinks    Comment: 1 drink every 2 months   . Drug use: No  . Sexual activity: Not on file  Lifestyle  . Physical activity    Days per week: Not on file    Minutes per session: Not on file  . Stress: Not on file  Relationships  . Social Herbalist on phone: Not on file    Gets together: Not on file    Attends  religious service: Not on file    Active member of club or organization: Not on file    Attends meetings of clubs or organizations: Not on file    Relationship status: Not on file  . Intimate partner violence    Fear of current or ex partner: Not on file    Emotionally abused: Not on file    Physically abused: Not on file    Forced sexual activity: Not on file  Other Topics Concern  . Not on file  Social History Narrative  . Not on file   Review of Systems Eating okay Weight went up but now has lost 20# since June Sleeps okay---still gets that flutter at night at times (relates to anxiety)    Objective:   Physical Exam  Constitutional: No distress.  Neck: No thyromegaly present.  Cardiovascular: Normal rate, regular rhythm, normal heart sounds and intact distal pulses. Exam reveals no gallop and no friction rub.  No murmur heard. Respiratory: Effort normal and breath sounds normal. No respiratory distress. She has no wheezes. She has no rales.  GI: Soft. There is no abdominal tenderness.  Musculoskeletal:        General: No edema.  Lymphadenopathy:    She has no cervical adenopathy.  Psychiatric: She has a normal mood and affect. Her behavior is normal.           Assessment & Plan:

## 2018-12-12 NOTE — Addendum Note (Signed)
Addended by: Ellamae Sia on: 12/12/2018 12:33 PM   Modules accepted: Orders

## 2018-12-12 NOTE — Addendum Note (Signed)
Addended by: Ellamae Sia on: 12/12/2018 11:16 AM   Modules accepted: Orders

## 2018-12-12 NOTE — Telephone Encounter (Signed)
Here now for assessment

## 2018-12-17 ENCOUNTER — Other Ambulatory Visit: Payer: Self-pay | Admitting: Family Medicine

## 2018-12-19 ENCOUNTER — Ambulatory Visit (INDEPENDENT_AMBULATORY_CARE_PROVIDER_SITE_OTHER): Payer: BC Managed Care – PPO

## 2018-12-19 ENCOUNTER — Encounter: Payer: Self-pay | Admitting: Cardiology

## 2018-12-19 ENCOUNTER — Ambulatory Visit: Payer: BC Managed Care – PPO | Admitting: Cardiology

## 2018-12-19 ENCOUNTER — Other Ambulatory Visit: Payer: Self-pay

## 2018-12-19 VITALS — BP 126/88 | HR 78 | Ht 69.0 in | Wt 272.0 lb

## 2018-12-19 DIAGNOSIS — I517 Cardiomegaly: Secondary | ICD-10-CM

## 2018-12-19 DIAGNOSIS — E7800 Pure hypercholesterolemia, unspecified: Secondary | ICD-10-CM | POA: Diagnosis not present

## 2018-12-19 DIAGNOSIS — R002 Palpitations: Secondary | ICD-10-CM

## 2018-12-19 NOTE — Patient Instructions (Signed)
Medication Instructions:  Your physician recommends that you continue on your current medications as directed. Please refer to the Current Medication list given to you today.  If you need a refill on your cardiac medications before your next appointment, please call your pharmacy.   Lab work: NONE If you have labs (blood work) drawn today and your tests are completely normal, you will receive your results only by: Marland Kitchen MyChart Message (if you have MyChart) OR . A paper copy in the mail If you have any lab test that is abnormal or we need to change your treatment, we will call you to review the results.  Testing/Procedures: Your physician has requested that you have an echocardiogram. Echocardiography is a painless test that uses sound waves to create images of your heart. It provides your doctor with information about the size and shape of your heart and how well your heart's chambers and valves are working. This procedure takes approximately one hour. There are no restrictions for this procedure. You may get an IV, if needed, to receive an ultrasound enhancing agent through to better visualize your heart.    Your physician has recommended that you wear an 28 DAY ZIO MONITOR event monitor. Event monitors are medical devices that record the heart's electrical activity. Doctors most often [place] these monitors to diagnose arrhythmias. Arrhythmias are problems with the speed or rhythm of the heartbeat. The monitor is a small, portable device. You can wear one while you do your normal daily activities. This is usually used to diagnose what is causing palpitations/syncope (passing out).  First Monitor: wear for 14 days. A Zio Patch Event Heart monitor will be applied to your chest today.  You will wear the patch for 28 days. After 24 hours, you may shower with the heart monitor on. If you feel any symptoms, you may press and release the button in the middle of the monitor.  Second Monitor: wear for 14  days. This will be directly shipped to you from [facility]- they will call you just prior to receipt of the monitor or when you actually receive the monitor.  If you get a call from an 800# or a 224 area code in the next 2-5 business days, then please answer the call.     Follow-Up: At [facility], you and your health needs are our priority.  As part of our continuing mission to provide you with exceptional heart care, we have created designated Provider Care Teams.  These Care Teams include your primary Cardiologist (physician) and Advanced Practice Providers (APPs -  Physician Assistants and Nurse Practitioners) who all work together to provide you with the care you need, when you need it. You will need a follow up appointment in 6 weeks.  Please call our office 2 months in advance to schedule this appointment.  You may see Kate Sable, MD or one of the following Advanced Practice Providers on your designated Care Team:   Murray Hodgkins, NP Christell Faith, PA-C . Marrianne Mood, PA-C    Echocardiogram An echocardiogram is a procedure that uses painless sound waves (ultrasound) to produce an image of the heart. Images from an echocardiogram can provide important information about:  Signs of coronary artery disease (CAD).  Aneurysm detection. An aneurysm is a weak or damaged part of an artery wall that bulges out from the normal force of blood pumping through the body.  Heart size and shape. Changes in the size or shape of the heart can be associated with  certain conditions, including heart failure, aneurysm, and CAD.  Heart muscle function.  Heart valve function.  Signs of a past heart attack.  Fluid buildup around the heart.  Thickening of the heart muscle.  A tumor or infectious growth around the heart valves. Tell a health care provider about:  Any allergies you have.  All medicines you are taking, including vitamins, herbs, eye drops, creams, and over-the-counter  medicines.  Any blood disorders you have.  Any surgeries you have had.  Any medical conditions you have.  Whether you are pregnant or may be pregnant. What are the risks? Generally, this is a safe procedure. However, problems may occur, including:  Allergic reaction to dye (contrast) that may be used during the procedure. What happens before the procedure? No specific preparation is needed. You may eat and drink normally. What happens during the procedure?   An IV tube may be inserted into one of your veins.  You may receive contrast through this tube. A contrast is an injection that improves the quality of the pictures from your heart.  A gel will be applied to your chest.  A wand-like tool (transducer) will be moved over your chest. The gel will help to transmit the sound waves from the transducer.  The sound waves will harmlessly bounce off of your heart to allow the heart images to be captured in real-time motion. The images will be recorded on a computer. The procedure may vary among health care providers and hospitals. What happens after the procedure?  You may return to your normal, everyday life, including diet, activities, and medicines, unless your health care provider tells you not to do that. Summary  An echocardiogram is a procedure that uses painless sound waves (ultrasound) to produce an image of the heart.  Images from an echocardiogram can provide important information about the size and shape of your heart, heart muscle function, heart valve function, and fluid buildup around your heart.  You do not need to do anything to prepare before this procedure. You may eat and drink normally.  After the echocardiogram is completed, you may return to your normal, everyday life, unless your health care provider tells you not to do that. This information is not intended to replace advice given to you by your health care provider. Make sure you discuss any questions you  have with your health care provider. Document Released: 03/09/2000 Document Revised: 07/03/2018 Document Reviewed: 04/14/2016 Elsevier Patient Education  2020 [employer].

## 2018-12-19 NOTE — Progress Notes (Signed)
Cardiology Office Note:    Date:  12/19/2018   ID:  Brittany [name], DOB [birth year], MRN [id number]  PCP:  [name], MD  Cardiologist:  Kate Sable, MD  Electrophysiologist:  None   Referring MD: Venia Carbon, MD   Chief Complaint  Patient presents with   New Patient (Initial Visit)    Referred by PCP for palpitations. Meds reviewed verbally with patient.     History of Present Illness:    Brittany [name] is a 43 y.o. female with a hx of hyperlipidemia, asthma who presents due to symptoms of palpitations.  Patient has had symptoms for years now, originally diagnosed in 2015.  She states having a history of ADHD, was placed on Adderall which she took for about 7 days.  During that time she noticed palpitations.  She was given a Holter monitor for 48 hours, which showed a sinus tach versus SVT.  She stopped taking Adderall and has not felt symptoms until last month when she woke up from sleep, having a flushed feeling in her neck, and noticed her heartbeat was irregular and fast.  Symptoms occurred daily for about 2 weeks, lasting for couple of hours and sometimes the entire day.  Over the past several weeks, symptoms of irregular heartbeat sometimes occur intermittently during the day but is not as severe. she denies chest pain or shortness of breath at rest or with exertion.  She exercises every day by jogging without any symptoms.  She denies any history of cardiac disease.   Past Medical History:  Diagnosis Date   Asthma    Heart murmur    PCOS (polycystic ovarian syndrome)     Past Surgical History:  Procedure Laterality Date   NO PAST SURGERIES      Current Medications: Current Meds  Medication Sig   albuterol (PROVENTIL) (2.5 MG/3ML) 0.083% nebulizer solution TAKE 3 MLS BY NEBULIZATION EVERY 6 (SIX) HOURS AS NEEDED FOR WHEEZING OR SHORTNESS OF BREATH.   albuterol (VENTOLIN HFA) 108 (90 Base) MCG/ACT inhaler TAKE 2 PUFFS BY MOUTH EVERY 6 HOURS AS NEEDED FOR  WHEEZE OR SHORTNESS OF BREATH   EPINEPHRINE 0.3 mg/0.3 mL IJ SOAJ injection INJECT AS DIRECTED   metFORMIN (GLUCOPHAGE) 1000 MG tablet Take 1,000 mg by mouth 2 (two) times daily.   montelukast (SINGULAIR) 10 MG tablet TAKE 1 TABLET BY MOUTH EVERYDAY AT BEDTIME   Norethindrone-Ethinyl Estradiol-Fe Biphas (LO LOESTRIN FE) 1 MG-10 MCG / 10 MCG tablet Lo Loestrin Fe 1 mg-10 mcg (24)/10 mcg (2) tablet   Omega-3 1000 MG CAPS Take 1,000 mg by mouth daily.   oxyCODONE (OXY IR/ROXICODONE) 5 MG immediate release tablet oxycodone 5 mg tablet   QVAR REDIHALER 40 MCG/ACT inhaler TAKE 2 PUFFS BY MOUTH TWICE A DAY   Turmeric 500 MG TABS Take by mouth. Take 2-3 tablets by mouth daily   VITAMIN D PO 25 mcg. Take 1/2 tablet daily   zinc gluconate 50 MG tablet Take 25 mg by mouth daily.     Allergies:   Iodine, Shellfish allergy, and Other   Social History   Socioeconomic History   Marital status: Married    Spouse name: Not on file   Number of children: 2   Years of education: Not on file   Highest education level: Not on file  Occupational History   Occupation: Pharmacist, hospital  Social Needs   Financial resource strain: Not on file   Food insecurity    Worry: Not on file  Inability: Not on file   Transportation needs    Medical: Not on file    Non-medical: Not on file  Tobacco Use   Smoking status: Never Smoker   Smokeless tobacco: Never Used  Substance and Sexual Activity   Alcohol use: Yes    Alcohol/week: 0.0 standard drinks    Comment: 1 drink every 2 months    Drug use: No   Sexual activity: Not on file  Lifestyle   Physical activity    Days per week: Not on file    Minutes per session: Not on file   Stress: Not on file  Relationships   Social connections    Talks on phone: Not on file    Gets together: Not on file    Attends religious service: Not on file    Active member of club or organization: Not on file    Attends meetings of clubs or organizations:  Not on file    Relationship status: Not on file  Other Topics Concern   Not on file  Social History Narrative   Not on file     Family History: The patient's family history includes Cancer in her paternal aunt; Colon polyps in her maternal aunt and maternal aunt; Diabetes in her maternal aunt; Heart attack in her maternal aunt and paternal grandfather; Hyperlipidemia in her mother; Hypertension in her brother; Ulcerative colitis in her sister.  ROS:   Please see the history of present illness.     All other systems reviewed and are negative.  EKGs/Labs/Other Studies Reviewed:    The following studies were reviewed today:   EKG:  EKG is  ordered today.  The ekg ordered today demonstrates normal sinus rhythm, normal ECG.  Echocardiogram 05/27/2014 Study Conclusions   - Left ventricle: The cavity size was mildly dilated. Wall  thickness was normal. Systolic function was normal. The estimated  ejection fraction was in the range of 55% to 60%. Wall motion was  normal; there were no regional wall motion abnormalities. Left  ventricular diastolic function parameters were normal.  - Left atrium: The atrium was at the upper limits of normal in  size.  - Right atrium: The atrium was mildly dilated.  - Atrial septum: Aneurysmal IAS. There was a patent foramen ovale.  - Inferior vena cava: The vessel was normal in size. The  respirophasic diameter changes were in the normal range (>= 50%),  consistent with normal central venous pressure.   Recent Labs: 12/12/2018: ALT 7; BUN 9; Creatinine, Ser 1.01; Hemoglobin 11.7; Platelets 279.0; Potassium 4.1; Sodium 139  Recent Lipid Panel    Component Value Date/Time   CHOL 230 (H) 12/12/2018 1233   TRIG 73.0 12/12/2018 1233   HDL 52.00 12/12/2018 1233   CHOLHDL 4 12/12/2018 1233   VLDL 14.6 12/12/2018 1233   LDLCALC 163 (H) 12/12/2018 1233    Physical Exam:    VS:  BP 126/88 (BP Location: Right Arm, Patient Position:  Sitting, Cuff Size: Normal)    Pulse 78    Ht 5' 9"  (1.753 m)    Wt 272 lb (123.4 kg)    BMI 40.17 kg/m     Wt Readings from Last 3 Encounters:  12/19/18 272 lb (123.4 kg)  12/12/18 275 lb (124.7 kg)  11/28/17 274 lb 4 oz (124.4 kg)     GEN:  Well nourished, well developed in no acute distress, obese HEENT: Normal NECK: No JVD; No carotid bruits LYMPHATICS: No lymphadenopathy CARDIAC: RRR, no murmurs, rubs,  gallops RESPIRATORY:  Clear to auscultation without rales, wheezing or rhonchi  ABDOMEN: Soft, non-tender, non-distended MUSCULOSKELETAL:  No edema; No deformity  SKIN: Warm and dry NEUROLOGIC:  Alert and oriented x 3 PSYCHIATRIC:  Normal affect   ASSESSMENT:   Prior echo did reveal a mild RA dilation, mild LV dilation and possible PFO 1. Palpitations   2. Left ventricular dilation   3. Pure hypercholesterolemia    PLAN:    In order of problems listed above:  1. Get ZIO patch x4 weeks. 2. Get echocardiogram. 3. Low-cholesterol diet advised.   Medication Adjustments/Labs and Tests Ordered: Current medicines are reviewed at length with the patient today.  Concerns regarding medicines are outlined above.  Orders Placed This Encounter  Procedures   LONG TERM MONITOR (3-14 DAYS)   LONG TERM MONITOR (3-14 DAYS)   EKG 12-Lead   ECHOCARDIOGRAM COMPLETE   No orders of the defined types were placed in this encounter.   Patient Instructions  Medication Instructions:  Your physician recommends that you continue on your current medications as directed. Please refer to the Current Medication list given to you today.  If you need a refill on your cardiac medications before your next appointment, please call your pharmacy.   Lab work: NONE If you have labs (blood work) drawn today and your tests are completely normal, you will receive your results only by:  [place] (if you have MyChart) OR  A paper copy in the mail If you have any lab test that is abnormal  or we need to change your treatment, we will call you to review the results.  Testing/Procedures: Your physician has requested that you have an echocardiogram. Echocardiography is a painless test that uses sound waves to create images of your heart. It provides your doctor with information about the size and shape of your heart and how well your hearts chambers and valves are working. This procedure takes approximately one hour. There are no restrictions for this procedure. You may get an IV, if needed, to receive an ultrasound enhancing agent through to better visualize your heart.    Your physician has recommended that you wear an 28 DAY ZIO MONITOR event monitor. Event monitors are medical devices that record the hearts electrical activity. Doctors most often [place] these monitors to diagnose arrhythmias. Arrhythmias are problems with the speed or rhythm of the heartbeat. The monitor is a small, portable device. You can wear one while you do your normal daily activities. This is usually used to diagnose what is causing palpitations/syncope (passing out).  First Monitor: wear for 14 days. A Zio Patch Event Heart monitor will be applied to your chest today.  You will wear the patch for 28 days. After 24 hours, you may shower with the heart monitor on. If you feel any symptoms, you may press and release the button in the middle of the monitor.  Second Monitor: wear for 14 days. This will be directly shipped to you from [facility]- they will call you just prior to receipt of the monitor or when you actually receive the monitor.  If you get a call from an 800# or a 224 area code in the next 2-5 business days, then please answer the call.     Follow-Up: At [facility], you and your health needs are our priority.  As part of our continuing mission to provide you with exceptional heart care, we have created designated Provider Care Teams.  These Care Teams include your primary Cardiologist (  physician)  and Advanced Practice Providers (APPs -  Physician Assistants and Nurse Practitioners) who all work together to provide you with the care you need, when you need it. You will need a follow up appointment in 6 weeks.  Please call our office 2 months in advance to schedule this appointment.  You may see Kate Sable, MD or one of the following Advanced Practice Providers on your designated Care Team:   Murray Hodgkins, NP Christell Faith, PA-C  Marrianne Mood, PA-C    Echocardiogram An echocardiogram is a procedure that uses painless sound waves (ultrasound) to produce an image of the heart. Images from an echocardiogram can provide important information about:  Signs of coronary artery disease (CAD).  Aneurysm detection. An aneurysm is a weak or damaged part of an artery wall that bulges out from the normal force of blood pumping through the body.  Heart size and shape. Changes in the size or shape of the heart can be associated with certain conditions, including heart failure, aneurysm, and CAD.  Heart muscle function.  Heart valve function.  Signs of a past heart attack.  Fluid buildup around the heart.  Thickening of the heart muscle.  A tumor or infectious growth around the heart valves. Tell a health care provider about:  Any allergies you have.  All medicines you are taking, including vitamins, herbs, eye drops, creams, and over-the-counter medicines.  Any blood disorders you have.  Any surgeries you have had.  Any medical conditions you have.  Whether you are pregnant or may be pregnant. What are the risks? Generally, this is a safe procedure. However, problems may occur, including:  Allergic reaction to dye (contrast) that may be used during the procedure. What happens before the procedure? No specific preparation is needed. You may eat and drink normally. What happens during the procedure?   An IV tube may be inserted into one of your veins.  You may  receive contrast through this tube. A contrast is an injection that improves the quality of the pictures from your heart.  A gel will be applied to your chest.  A wand-like tool (transducer) will be moved over your chest. The gel will help to transmit the sound waves from the transducer.  The sound waves will harmlessly bounce off of your heart to allow the heart images to be captured in real-time motion. The images will be recorded on a computer. The procedure may vary among health care providers and hospitals. What happens after the procedure?  You may return to your normal, everyday life, including diet, activities, and medicines, unless your health care provider tells you not to do that. Summary  An echocardiogram is a procedure that uses painless sound waves (ultrasound) to produce an image of the heart.  Images from an echocardiogram can provide important information about the size and shape of your heart, heart muscle function, heart valve function, and fluid buildup around your heart.  You do not need to do anything to prepare before this procedure. You may eat and drink normally.  After the echocardiogram is completed, you may return to your normal, everyday life, unless your health care provider tells you not to do that. This information is not intended to replace advice given to you by your health care provider. Make sure you discuss any questions you have with your health care provider. Document Released: 03/09/2000 Document Revised: 07/03/2018 Document Reviewed: 04/14/2016 Elsevier Patient Education  2020 [place],  Kate Sable, MD  12/19/2018 2:16 PM    [facility]

## 2018-12-22 LAB — CELIAC PNL 2 RFLX ENDOMYSIAL AB TTR
(tTG) Ab, IgA: 13 U/mL — ABNORMAL HIGH
(tTG) Ab, IgG: 27 U/mL — ABNORMAL HIGH
Endomysial Ab IgA: POSITIVE — AB
Endomysial Titer: 1:5 {titer} — ABNORMAL HIGH
Gliadin IgA: 71 U — ABNORMAL HIGH (ref <20)
Gliadin IgG: >100 U — ABNORMAL HIGH (ref <20)
Immunoglobulin A: 159 mg/dL (ref 47–310)

## 2018-12-24 ENCOUNTER — Telehealth: Payer: Self-pay | Admitting: Cardiology

## 2018-12-24 NOTE — Telephone Encounter (Signed)
Patient states he ZIO monitor is coming off, please call to discuss.

## 2018-12-24 NOTE — Telephone Encounter (Signed)
Called patient. She says the monitor got lose this morning after her exercise when she sweated a lot. Patient dried it off with a tissue and then it seemed to go back on. There is no orange light one. Advised her to call the ZIO number for further advice and to call me back if needed. She was appreciative and verbalized understanding.

## 2018-12-25 ENCOUNTER — Telehealth: Payer: Self-pay | Admitting: Family Medicine

## 2018-12-25 NOTE — Telephone Encounter (Signed)
Pt seen 2 weeks ago Referred to GI last OV - needing most recent results faxed over (f) [phone] [facility], Attn: Dr. Watt Climes

## 2018-12-25 NOTE — Telephone Encounter (Signed)
11/2018 Labs faxed to [facility], Dr. Watt Climes.

## 2019-01-03 ENCOUNTER — Ambulatory Visit (INDEPENDENT_AMBULATORY_CARE_PROVIDER_SITE_OTHER): Payer: BC Managed Care – PPO

## 2019-01-03 DIAGNOSIS — R002 Palpitations: Secondary | ICD-10-CM | POA: Diagnosis not present

## 2019-01-06 ENCOUNTER — Other Ambulatory Visit: Payer: Self-pay | Admitting: Family Medicine

## 2019-01-06 NOTE — Telephone Encounter (Signed)
Pt left v/m requesting colonoscopy and celiac panel faxed to Dr Watt Climes at [place] fax # [phone]. Pt request cb when completed.

## 2019-01-07 NOTE — Telephone Encounter (Signed)
Labs 12-12-2018 and Colonoscopy 05-16-16 faxed to Dr. Elon Jester, [phone]. Pt informed.

## 2019-01-08 ENCOUNTER — Telehealth: Payer: Self-pay | Admitting: Family Medicine

## 2019-01-08 DIAGNOSIS — D649 Anemia, unspecified: Secondary | ICD-10-CM

## 2019-01-08 DIAGNOSIS — E7800 Pure hypercholesterolemia, unspecified: Secondary | ICD-10-CM

## 2019-01-08 NOTE — Telephone Encounter (Signed)
-----   Message from Ellamae Sia sent at 01/06/2019  2:59 PM EDT ----- Regarding: Lab orders for Thursday, 10.22.20 Patient is scheduled for CPX labs, please order future labs, Thanks , Karna Christmas

## 2019-01-15 ENCOUNTER — Other Ambulatory Visit: Payer: BC Managed Care – PPO

## 2019-01-20 ENCOUNTER — Encounter: Payer: BC Managed Care – PPO | Admitting: Family Medicine

## 2019-01-27 ENCOUNTER — Other Ambulatory Visit: Payer: Self-pay

## 2019-01-27 ENCOUNTER — Ambulatory Visit (INDEPENDENT_AMBULATORY_CARE_PROVIDER_SITE_OTHER): Payer: BC Managed Care – PPO

## 2019-01-27 DIAGNOSIS — I517 Cardiomegaly: Secondary | ICD-10-CM

## 2019-01-29 ENCOUNTER — Ambulatory Visit: Payer: BC Managed Care – PPO | Admitting: Cardiology

## 2019-02-05 ENCOUNTER — Ambulatory Visit (INDEPENDENT_AMBULATORY_CARE_PROVIDER_SITE_OTHER): Payer: BC Managed Care – PPO | Admitting: Cardiology

## 2019-02-05 ENCOUNTER — Encounter: Payer: Self-pay | Admitting: Cardiology

## 2019-02-05 ENCOUNTER — Other Ambulatory Visit: Payer: Self-pay

## 2019-02-05 VITALS — BP 118/80 | HR 72 | Temp 97.2°F | Ht 69.0 in | Wt 269.0 lb

## 2019-02-05 DIAGNOSIS — R002 Palpitations: Secondary | ICD-10-CM

## 2019-02-05 DIAGNOSIS — Z6839 Body mass index (BMI) 39.0-39.9, adult: Secondary | ICD-10-CM | POA: Diagnosis not present

## 2019-02-05 DIAGNOSIS — E7800 Pure hypercholesterolemia, unspecified: Secondary | ICD-10-CM

## 2019-02-05 NOTE — Progress Notes (Signed)
Cardiology Office Note:    Date:  02/05/2019   ID:  Brittany [name], DOB [birth year], MRN [id number]  PCP:  [name], MD  Cardiologist:  Kate Sable, MD  Electrophysiologist:  None   Referring MD: [name], MD   Chief Complaint  Patient presents with  . office visit    F/U after echo; Meds verbally reviewed with patient.    History of Present Illness:    Brittany [name] is a 43 y.o. female with a hx of hyperlipidemia, asthma who presents for follow-up.  She was originally seen due to symptoms of palpitations.    At the time, patient stated having symptoms for years originally diagnosed in 2015.  She had a history of ADHD and took Adderall for couple of days.  She had not felt any symptoms until a month ago that woke her up from sleep.  She feels flushed in her neck.  Symptoms occur daily for about 2 weeks.  An echocardiogram was ordered and also a 4-week cardiac monitor.  She now presents for results.  Patient states drinking green tea which was caffeinated when she wore her first Holter.  She has since stopped drinking caffeinated products with improvement in symptoms.  She used to take is licorice supplement and stop which is also helped.   Past Medical History:  Diagnosis Date  . Asthma   . Heart murmur   . PCOS (polycystic ovarian syndrome)     Past Surgical History:  Procedure Laterality Date  . NO PAST SURGERIES      Current Medications: Current Meds  Medication Sig  . albuterol (PROVENTIL) (2.5 MG/3ML) 0.083% nebulizer solution TAKE 3 MLS BY NEBULIZATION EVERY 6 (SIX) HOURS AS NEEDED FOR WHEEZING OR SHORTNESS OF BREATH.  Marland Kitchen albuterol (VENTOLIN HFA) 108 (90 Base) MCG/ACT inhaler TAKE 2 PUFFS BY MOUTH EVERY 6 HOURS AS NEEDED FOR WHEEZE OR SHORTNESS OF BREATH  . EPINEPHRINE 0.3 mg/0.3 mL IJ SOAJ injection INJECT AS DIRECTED  . metFORMIN (GLUCOPHAGE) 1000 MG tablet Take 1,000 mg by mouth 2 (two) times daily.  . montelukast (SINGULAIR) 10 MG tablet TAKE 1  TABLET BY MOUTH EVERYDAY AT BEDTIME  . Norethindrone-Ethinyl Estradiol-Fe Biphas (LO LOESTRIN FE) 1 MG-10 MCG / 10 MCG tablet Lo Loestrin Fe 1 mg-10 mcg (24)/10 mcg (2) tablet  . Omega-3 1000 MG CAPS Take 1,000 mg by mouth daily.  Marland Kitchen oxyCODONE (OXY IR/ROXICODONE) 5 MG immediate release tablet oxycodone 5 mg tablet  . QVAR REDIHALER 40 MCG/ACT inhaler TAKE 2 PUFFS BY MOUTH TWICE A DAY  . Turmeric 500 MG TABS Take by mouth. Take 2-3 tablets by mouth daily  . VITAMIN D PO 25 mcg. Take 1/2 tablet daily  . zinc gluconate 50 MG tablet Take 25 mg by mouth daily.     Allergies:   Iodine, Shellfish allergy, and Other   Social History   Socioeconomic History  . Marital status: Married    Spouse name: Not on file  . Number of children: 2  . Years of education: Not on file  . Highest education level: Not on file  Occupational History  . Occupation: Pharmacist, hospital  Social Needs  . Financial resource strain: Not on file  . Food insecurity    Worry: Not on file    Inability: Not on file  . Transportation needs    Medical: Not on file    Non-medical: Not on file  Tobacco Use  . Smoking status: Never Smoker  . Smokeless tobacco:  Never Used  Substance and Sexual Activity  . Alcohol use: Yes    Alcohol/week: 0.0 standard drinks    Comment: 1 drink every 2 months   . Drug use: No  . Sexual activity: Not on file  Lifestyle  . Physical activity    Days per week: Not on file    Minutes per session: Not on file  . Stress: Not on file  Relationships  . Social Herbalist on phone: Not on file    Gets together: Not on file    Attends religious service: Not on file    Active member of club or organization: Not on file    Attends meetings of clubs or organizations: Not on file    Relationship status: Not on file  Other Topics Concern  . Not on file  Social History Narrative  . Not on file     Family History: The patient's family history includes Cancer in her paternal aunt; Colon  polyps in her maternal aunt and maternal aunt; Diabetes in her maternal aunt; Heart attack in her maternal aunt and paternal grandfather; Hyperlipidemia in her mother; Hypertension in her brother; Ulcerative colitis in her sister.  ROS:   Please see the history of present illness.     All other systems reviewed and are negative.  EKGs/Labs/Other Studies Reviewed:    The following studies were reviewed today: TTE 02/11/2019  1. Left ventricular ejection fraction, by visual estimation, is 50 to 55%. The left ventricle has low normal function. There is no left ventricular hypertrophy.  2. The left ventricle has no regional wall motion abnormalities.  3. Global right ventricle has normal systolic function.The right ventricular size is normal. No increase in right ventricular wall thickness.  4. Left atrial size was normal.  5. Right atrial size was normal.  6. The mitral valve is normal in structure. Trace mitral valve regurgitation. No evidence of mitral stenosis.  7. The tricuspid valve is grossly normal. Tricuspid valve regurgitation is mild.  8. The aortic valve has an indeterminant number of cusps. Aortic valve regurgitation is not visualized. There is no aortic valve stenosis.  9. The pulmonic valve was normal in structure. Pulmonic valve regurgitation is trivial. 10. Normal pulmonary artery systolic pressure. 11. The inferior vena cava is normal in size with greater than 50% respiratory variability, suggesting right atrial pressure of 3 mmHg.  Cardiac monitor x4 weeks total 12/19/2018, 01/03/2019 Patient had a min HR of 30 bpm, max HR of 166 bpm, and avg HR of 71 bpm. Predominant underlying rhythm was Sinus Rhythm. First Degree AV Block was present. 1 run of Ventricular Tachycardia occurred lasting 4 beats with a max rate of 113 bpm (avg 110 bpm). Second Degree AV Block-Mobitz I (Wenckebach) was present. Isolated SVEs were rare (<1.0%), SVE Couplets were rare (<1.0%), and no SVE  Triplets were present. Isolated VEs were rare (<1.0%), VE Couplets were rare (<1.0%), and no VE Triplets were present. Patient triggered events were associated with sinus rhythm.  Patient had a min HR of 27 bpm, max HR of 185 bpm, and avg HR of 75 bpm. Predominant underlying rhythm was Sinus Rhythm. First Degree AV Block was present. Second Degree AV Block-Mobitz I (Wenckebach) was present. Isolated SVEs were rare (<1.0%), SVE Couplets were rare (<1.0%), and no SVE Triplets were present. Isolated VEs were rare (<1.0%), and no VE Couplets or VE Triplets were present.   All tachycardias where sinus. Triggered events were associated with sinus  rhythm  EKG:  EKG was not ordered today.    Echocardiogram 05/27/2014 Study Conclusions   - Left ventricle: The cavity size was mildly dilated. Wall  thickness was normal. Systolic function was normal. The estimated  ejection fraction was in the range of 55% to 60%. Wall motion was  normal; there were no regional wall motion abnormalities. Left  ventricular diastolic function parameters were normal.  - Left atrium: The atrium was at the upper limits of normal in  size.  - Right atrium: The atrium was mildly dilated.  - Atrial septum: Aneurysmal IAS. There was a patent foramen ovale.  - Inferior vena cava: The vessel was normal in size. The  respirophasic diameter changes were in the normal range (>= 50%),  consistent with normal central venous pressure.   Recent Labs: 12/12/2018: ALT 7; BUN 9; Creatinine, Ser 1.01; Hemoglobin 11.7; Platelets 279.0; Potassium 4.1; Sodium 139  Recent Lipid Panel    Component Value Date/Time   CHOL 230 (H) 12/12/2018 1233   TRIG 73.0 12/12/2018 1233   HDL 52.00 12/12/2018 1233   CHOLHDL 4 12/12/2018 1233   VLDL 14.6 12/12/2018 1233   LDLCALC 163 (H) 12/12/2018 1233    Physical Exam:    VS:  BP 118/80 (BP Location: Left Arm, Patient Position: Sitting, Cuff Size: Large)   Pulse 72   Temp (!)  97.2 F (36.2 C)   Ht 5' 9"  (1.753 m)   Wt 269 lb (122 kg)   SpO2 99%   BMI 39.72 kg/m     Wt Readings from Last 3 Encounters:  02/05/19 269 lb (122 kg)  12/19/18 272 lb (123.4 kg)  12/12/18 275 lb (124.7 kg)     GEN:  Well nourished, well developed in no acute distress, obese HEENT: Normal NECK: No JVD; No carotid bruits LYMPHATICS: No lymphadenopathy CARDIAC: RRR, no murmurs, rubs, gallops RESPIRATORY:  Clear to auscultation without rales, wheezing or rhonchi  ABDOMEN: Soft, non-tender, non-distended MUSCULOSKELETAL:  No edema; No deformity  SKIN: Warm and dry NEUROLOGIC:  Alert and oriented x 3 PSYCHIATRIC:  Normal affect   ASSESSMENT:   Echocardiogram showed normal ejection fraction, normal diastolic function.  4-week cardiac monitor did not reveal any significant arrhythmias or ectopy to justify patient's symptoms.  Patient triggered symptoms were associated with sinus rhythm. 1. Palpitations   2. Pure hypercholesterolemia   3. BMI 39.0-39.9,adult    PLAN:    In order of problems listed above:  1. Patient triggered symptoms were associated with sinus rhythm.  No significant cardiac arrhythmias noted patient reassured 2. Low-cholesterol diet advised. 3. Weight loss advised, low calorie diet advised.  Follow-up as needed  This note was generated in part or whole with voice recognition software. Voice recognition is usually quite accurate but there are transcription errors that can and very often do occur. I apologize for any typographical errors that were not detected and corrected.  Medication Adjustments/Labs and Tests Ordered: Current medicines are reviewed at length with the patient today.  Concerns regarding medicines are outlined above.  No orders of the defined types were placed in this encounter.  No orders of the defined types were placed in this encounter.   Patient Instructions  Medication Instructions:  - Your physician recommends that you continue  on your current medications as directed. Please refer to the Current Medication list given to you today.  *If you need a refill on your cardiac medications before your next appointment, please call your pharmacy*  Lab Work: -  none ordered  If you have labs (blood work) drawn today and your tests are completely normal, you will receive your results only by: Marland Kitchen MyChart Message (if you have MyChart) OR . A paper copy in the mail If you have any lab test that is abnormal or we need to change your treatment, we will call you to review the results.  Testing/Procedures: - none ordered  Follow-Up: At [facility], you and your health needs are our priority.  As part of our continuing mission to provide you with exceptional heart care, we have created designated Provider Care Teams.  These Care Teams include your primary Cardiologist (physician) and Advanced Practice Providers (APPs -  Physician Assistants and Nurse Practitioners) who all work together to provide you with the care you need, when you need it.  Your next appointment:   as needed  The format for your next appointment:   n/a  Provider:   n/a  Other Instructions n/a     Signed, Kate Sable, MD  02/05/2019 4:24 PM    [place]

## 2019-02-05 NOTE — Patient Instructions (Signed)
Medication Instructions:  - Your physician recommends that you continue on your current medications as directed. Please refer to the Current Medication list given to you today.  *If you need a refill on your cardiac medications before your next appointment, please call your pharmacy*  Lab Work: - none ordered  If you have labs (blood work) drawn today and your tests are completely normal, you will receive your results only by: Marland Kitchen MyChart Message (if you have MyChart) OR . A paper copy in the mail If you have any lab test that is abnormal or we need to change your treatment, we will call you to review the results.  Testing/Procedures: - none ordered  Follow-Up: At [facility], you and your health needs are our priority.  As part of our continuing mission to provide you with exceptional heart care, we have created designated Provider Care Teams.  These Care Teams include your primary Cardiologist (physician) and Advanced Practice Providers (APPs -  Physician Assistants and Nurse Practitioners) who all work together to provide you with the care you need, when you need it.  Your next appointment:   as needed  The format for your next appointment:   n/a  Provider:   n/a  Other Instructions n/a

## 2019-02-06 ENCOUNTER — Encounter: Payer: BC Managed Care – PPO | Admitting: Family Medicine

## 2019-03-12 ENCOUNTER — Telehealth: Payer: Self-pay | Admitting: Family Medicine

## 2019-03-12 NOTE — Telephone Encounter (Signed)
Please schedule CPE with Dr. Diona Browner with fasting labs prior.

## 2019-03-13 NOTE — Telephone Encounter (Signed)
Lab 2/5 cpx 2/9 Pt aware

## 2019-04-03 ENCOUNTER — Other Ambulatory Visit: Payer: Self-pay | Admitting: Family Medicine

## 2019-04-12 ENCOUNTER — Other Ambulatory Visit: Payer: Self-pay | Admitting: Family Medicine

## 2019-04-29 ENCOUNTER — Telehealth: Payer: Self-pay | Admitting: Family Medicine

## 2019-04-29 DIAGNOSIS — E7800 Pure hypercholesterolemia, unspecified: Secondary | ICD-10-CM

## 2019-04-29 NOTE — Telephone Encounter (Signed)
-----   Message from Cloyd Stagers, RT sent at 04/17/2019  3:04 PM EST ----- Regarding: Lab Orders For Friday 2.5.2021 Please place lab orders for Friday 2.5.2021, office visit for physical on Tuesday 2.9.2021 Thank you, Dyke Maes RT(R)

## 2019-05-01 ENCOUNTER — Other Ambulatory Visit (INDEPENDENT_AMBULATORY_CARE_PROVIDER_SITE_OTHER): Payer: BC Managed Care – PPO

## 2019-05-01 ENCOUNTER — Other Ambulatory Visit: Payer: Self-pay

## 2019-05-01 DIAGNOSIS — E7800 Pure hypercholesterolemia, unspecified: Secondary | ICD-10-CM

## 2019-05-01 LAB — COMPREHENSIVE METABOLIC PANEL
ALT: 8 U/L (ref 0–35)
AST: 14 U/L (ref 0–37)
Albumin: 4.2 g/dL (ref 3.5–5.2)
Alkaline Phosphatase: 38 U/L — ABNORMAL LOW (ref 39–117)
BUN: 10 mg/dL (ref 6–23)
CO2: 25 mEq/L (ref 19–32)
Calcium: 9.2 mg/dL (ref 8.4–10.5)
Chloride: 106 mEq/L (ref 96–112)
Creatinine, Ser: 1.11 mg/dL (ref 0.40–1.20)
GFR: 65.3 mL/min (ref >60.00)
Glucose, Bld: 84 mg/dL (ref 70–99)
Potassium: 4 mEq/L (ref 3.5–5.1)
Sodium: 138 mEq/L (ref 135–145)
Total Bilirubin: 0.8 mg/dL (ref 0.2–1.2)
Total Protein: 7.3 g/dL (ref 6.0–8.3)

## 2019-05-01 LAB — LIPID PANEL
Cholesterol: 194 mg/dL (ref 0–200)
HDL: 47.7 mg/dL (ref >39.00)
LDL Cholesterol: 135 mg/dL — ABNORMAL HIGH (ref 0–99)
NonHDL: 146.76
Total CHOL/HDL Ratio: 4
Triglycerides: 59 mg/dL (ref 0.0–149.0)
VLDL: 11.8 mg/dL (ref 0.0–40.0)

## 2019-05-01 NOTE — Progress Notes (Signed)
No critical labs need to be addressed urgently. We will discuss labs in detail at upcoming office visit.   

## 2019-05-05 ENCOUNTER — Other Ambulatory Visit: Payer: Self-pay

## 2019-05-05 ENCOUNTER — Ambulatory Visit (INDEPENDENT_AMBULATORY_CARE_PROVIDER_SITE_OTHER): Payer: BC Managed Care – PPO | Admitting: Family Medicine

## 2019-05-05 ENCOUNTER — Encounter: Payer: Self-pay | Admitting: Family Medicine

## 2019-05-05 VITALS — BP 110/74 | HR 77 | Temp 98.4°F | Ht 68.25 in | Wt 261.0 lb

## 2019-05-05 DIAGNOSIS — E7800 Pure hypercholesterolemia, unspecified: Secondary | ICD-10-CM

## 2019-05-05 DIAGNOSIS — D509 Iron deficiency anemia, unspecified: Secondary | ICD-10-CM

## 2019-05-05 DIAGNOSIS — J4540 Moderate persistent asthma, uncomplicated: Secondary | ICD-10-CM | POA: Diagnosis not present

## 2019-05-05 DIAGNOSIS — Z0000 Encounter for general adult medical examination without abnormal findings: Secondary | ICD-10-CM | POA: Diagnosis not present

## 2019-05-05 DIAGNOSIS — R002 Palpitations: Secondary | ICD-10-CM

## 2019-05-05 DIAGNOSIS — K900 Celiac disease: Secondary | ICD-10-CM

## 2019-05-05 NOTE — Progress Notes (Signed)
Chief Complaint  Patient presents with  . Annual Exam    History of Present Illness: HPI   The patient is here for annual wellness exam and preventative care.    Elevated Cholesterol:  Improved from last check 4 months ago. Lab Results  Component Value Date   CHOL 194 05/01/2019   HDL 47.70 05/01/2019   LDLCALC 135 (H) 05/01/2019   TRIG 59.0 05/01/2019   CHOLHDL 4 05/01/2019  Using medications without problems: Muscle aches:  Diet compliance: nutrition Exercise: trainer Other complaints:  She has lost 10 lbs tin last 6 months Wt Readings from Last 3 Encounters:  05/05/19 261 lb (118.4 kg)  02/05/19 269 lb (122 kg)  12/19/18 272 lb (123.4 kg)   Moderate persistent asthma: no flares using  QVAR daily albuterol prn rarely.  PCOS working on weight loss..   Now off  metformin . On birth control.  She is slightly anemic. Has heavy menses.. now on    Dx of celiac disease in 11/2018 [facility] Dr. Watt Climes  Has follow up.  She   Recently seen cardiology for palpitations : Echocardiogram showed normal ejection fraction, normal diastolic function.  4-week cardiac monitor did not reveal any significant arrhythmias or ectopy to justify patient's symptoms.  Patient triggered symptoms were associated with sinus rhythm.   Has improved with improvement in anxiety.   This visit occurred during the SARS-CoV-2 public health emergency.  Safety protocols were in place, including screening questions prior to the visit, additional usage of staff PPE, and extensive cleaning of exam room while observing appropriate contact time as indicated for disinfecting solutions.   COVID 19 screen:  No recent travel or known exposure to COVID19 The patient denies respiratory symptoms of COVID 19 at this time. The importance of social distancing was discussed today.     Review of Systems  Constitutional: Negative for chills and fever.  HENT: Negative for congestion and ear pain.   Eyes: Negative for pain  and redness.  Respiratory: Negative for cough and shortness of breath.   Cardiovascular: Negative for chest pain, palpitations and leg swelling.  Gastrointestinal: Negative for abdominal pain, blood in stool, constipation, diarrhea, nausea and vomiting.  Genitourinary: Negative for dysuria.  Musculoskeletal: Negative for falls and myalgias.  Skin: Negative for rash.  Neurological: Negative for dizziness.  Psychiatric/Behavioral: Negative for depression. The patient is not nervous/anxious.       Past Medical History:  Diagnosis Date  . Asthma   . Heart murmur   . PCOS (polycystic ovarian syndrome)     reports that she has never smoked. She has never used smokeless tobacco. She reports current alcohol use. She reports that she does not use drugs.   Current Outpatient Medications:  .  albuterol (PROVENTIL) (2.5 MG/3ML) 0.083% nebulizer solution, TAKE 3 MLS BY NEBULIZATION EVERY 6 (SIX) HOURS AS NEEDED FOR WHEEZING OR SHORTNESS OF BREATH., Disp: 150 mL, Rfl: 1 .  albuterol (VENTOLIN HFA) 108 (90 Base) MCG/ACT inhaler, TAKE 2 PUFFS BY MOUTH EVERY 6 HOURS AS NEEDED FOR WHEEZE OR SHORTNESS OF BREATH, Disp: 18 g, Rfl: 2 .  EPINEPHRINE 0.3 mg/0.3 mL IJ SOAJ injection, INJECT AS DIRECTED, Disp: 2 each, Rfl: 0 .  montelukast (SINGULAIR) 10 MG tablet, TAKE 1 TABLET BY MOUTH EVERYDAY AT BEDTIME, Disp: 90 tablet, Rfl: 0 .  Norethindrone-Ethinyl Estradiol-Fe Biphas (LO LOESTRIN FE) 1 MG-10 MCG / 10 MCG tablet, Lo Loestrin Fe 1 mg-10 mcg (24)/10 mcg (2) tablet, Disp: , Rfl:  .  Omega-3 1000 MG CAPS, Take 1,000 mg by mouth daily., Disp: , Rfl:  .  QUERCETIN PO, Take 1 tablet by mouth daily., Disp: , Rfl:  .  QVAR REDIHALER 40 MCG/ACT inhaler, TAKE 2 PUFFS BY MOUTH TWICE A DAY, Disp: 10.6 g, Rfl: 0 .  Turmeric 500 MG TABS, Take by mouth. Take 2-3 tablets by mouth daily, Disp: , Rfl:  .  VITAMIN D PO, 25 mcg. Take 1/2 tablet daily, Disp: , Rfl:  .  zinc gluconate 50 MG tablet, Take 25 mg by mouth daily.,  Disp: , Rfl:  .  metFORMIN (GLUCOPHAGE) 1000 MG tablet, Take 1,000 mg by mouth 2 (two) times daily., Disp: , Rfl: 5   Observations/Objective: Blood pressure 110/74, pulse 77, temperature 98.4 F (36.9 C), temperature source Temporal, height 5' 8.25" (1.734 m), weight 261 lb (118.4 kg), last menstrual period 04/06/2019, SpO2 98 %.  Physical Exam Constitutional:      General: She is not in acute distress.    Appearance: Normal appearance. She is well-developed. She is obese. She is not ill-appearing or toxic-appearing.  HENT:     Head: Normocephalic.     Right Ear: Hearing, tympanic membrane, ear canal and external ear normal. Tympanic membrane is not erythematous, retracted or bulging.     Left Ear: Hearing, tympanic membrane, ear canal and external ear normal. Tympanic membrane is not erythematous, retracted or bulging.     Nose: Nose normal. No mucosal edema or rhinorrhea.     Right Sinus: No maxillary sinus tenderness or frontal sinus tenderness.     Left Sinus: No maxillary sinus tenderness or frontal sinus tenderness.     Mouth/Throat:     Pharynx: Uvula midline.  Eyes:     General: Lids are normal. Lids are everted, no foreign bodies appreciated.     Conjunctiva/sclera: Conjunctivae normal.     Pupils: Pupils are equal, round, and reactive to light.  Neck:     Thyroid: No thyroid mass or thyromegaly.     Vascular: No carotid bruit.     Trachea: Trachea normal.  Cardiovascular:     Rate and Rhythm: Normal rate and regular rhythm.     Pulses: Normal pulses.     Heart sounds: Normal heart sounds, S1 normal and S2 normal. No murmur. No friction rub. No gallop.   Pulmonary:     Effort: Pulmonary effort is normal. No tachypnea or respiratory distress.     Breath sounds: Normal breath sounds. No decreased breath sounds, wheezing, rhonchi or rales.  Abdominal:     General: Bowel sounds are normal. There is no distension or abdominal bruit.     Palpations: Abdomen is soft. There is  no fluid wave or mass.     Tenderness: There is no abdominal tenderness. There is no guarding or rebound.     Hernia: No hernia is present.  Musculoskeletal:     Cervical back: Normal range of motion and neck supple.  Lymphadenopathy:     Cervical: No cervical adenopathy.  Skin:    General: Skin is warm and dry.     Findings: No rash.  Neurological:     Mental Status: She is alert.     Cranial Nerves: No cranial nerve deficit.     Sensory: No sensory deficit.  Psychiatric:        Mood and Affect: Mood is not anxious or depressed.        Speech: Speech normal.        Behavior:  Behavior normal. Behavior is cooperative.        Thought Content: Thought content normal.        Judgment: Judgment normal.      Assessment and Plan The patient's preventative maintenance and recommended screening tests for an annual wellness exam were reviewed in full today. Brought up to date unless services declined.  Counselled on the importance of diet, exercise, and its role in overall health and mortality. The patient's FH and SH was reviewed, including their home life, tobacco status, and drug and alcohol status.   Vaccines: Due for tdap ... Pap/DVE:  GYN 09/2017 Mammo: no early family history of breast cancer, 09/2017 neg Colon: no early family history of colon cancer Smoking Status:none ETOH/ drug use none/none nonsmoker HIV screen:  refused   Celiac disease New diagnosis . Reviewed last GI note. She is now gluten free.. less bloating.  Now more constipation.  Hyperlipidemia Improving with lifestyle changes.  Moderate persistent asthma in adult without complication no flares using  QVAR daily albuterol prn rarely.   Morbid obesity Encouraged exercise, weight loss, healthy eating habits.   Palpitations Followed by cardiology.. reviewed recent note.  Unremarkbale  Month long heart monitor  Iron deficiency anemia Mild.. increase iron with iron rich diet and multivitamin with  iron.     Eliezer Lofts, MD

## 2019-05-05 NOTE — Assessment & Plan Note (Addendum)
New diagnosis . Reviewed last GI note. She is now gluten free.. less bloating.  Now more constipation.

## 2019-05-05 NOTE — Patient Instructions (Signed)

## 2019-05-11 ENCOUNTER — Other Ambulatory Visit: Payer: Self-pay | Admitting: Family Medicine

## 2019-05-18 ENCOUNTER — Encounter: Payer: Self-pay | Admitting: Family Medicine

## 2019-06-01 NOTE — Assessment & Plan Note (Signed)
Improving with lifestyle changes.

## 2019-06-01 NOTE — Assessment & Plan Note (Signed)
no flares using  QVAR daily albuterol prn rarely.

## 2019-06-01 NOTE — Assessment & Plan Note (Signed)
Encouraged exercise, weight loss, healthy eating habits.

## 2019-06-01 NOTE — Assessment & Plan Note (Signed)
Mild.. increase iron with iron rich diet and multivitamin with iron.

## 2019-06-01 NOTE — Assessment & Plan Note (Signed)
Followed by cardiology.. reviewed recent note.  Unremarkbale  Month long heart monitor

## 2019-09-14 ENCOUNTER — Other Ambulatory Visit: Payer: Self-pay | Admitting: Family Medicine

## 2019-11-02 ENCOUNTER — Telehealth: Payer: Self-pay | Admitting: Family Medicine

## 2019-11-02 DIAGNOSIS — Z1159 Encounter for screening for other viral diseases: Secondary | ICD-10-CM

## 2019-11-02 DIAGNOSIS — E282 Polycystic ovarian syndrome: Secondary | ICD-10-CM

## 2019-11-02 DIAGNOSIS — D509 Iron deficiency anemia, unspecified: Secondary | ICD-10-CM

## 2019-11-02 DIAGNOSIS — E7800 Pure hypercholesterolemia, unspecified: Secondary | ICD-10-CM

## 2019-11-02 NOTE — Telephone Encounter (Signed)
-----   Message from Ellamae Sia sent at 10/21/2019 11:55 AM EDT ----- Regarding: Lab orders for Tuesday, 8.10.21 Patient is scheduled for CPX labs, please order future labs, Thanks , Karna Christmas

## 2019-11-03 ENCOUNTER — Other Ambulatory Visit: Payer: BC Managed Care – PPO

## 2019-11-04 ENCOUNTER — Other Ambulatory Visit (INDEPENDENT_AMBULATORY_CARE_PROVIDER_SITE_OTHER): Payer: BC Managed Care – PPO

## 2019-11-04 ENCOUNTER — Other Ambulatory Visit: Payer: Self-pay

## 2019-11-04 DIAGNOSIS — E282 Polycystic ovarian syndrome: Secondary | ICD-10-CM

## 2019-11-04 DIAGNOSIS — E7800 Pure hypercholesterolemia, unspecified: Secondary | ICD-10-CM | POA: Diagnosis not present

## 2019-11-04 DIAGNOSIS — D509 Iron deficiency anemia, unspecified: Secondary | ICD-10-CM | POA: Diagnosis not present

## 2019-11-04 DIAGNOSIS — Z1159 Encounter for screening for other viral diseases: Secondary | ICD-10-CM

## 2019-11-04 LAB — CBC WITH DIFFERENTIAL/PLATELET
Basophils Absolute: 0.1 10*3/uL (ref 0.0–0.1)
Basophils Relative: 2 % (ref 0.0–3.0)
Eosinophils Absolute: 0.1 10*3/uL (ref 0.0–0.7)
Eosinophils Relative: 1.1 % (ref 0.0–5.0)
HCT: 34.4 % — ABNORMAL LOW (ref 36.0–46.0)
Hemoglobin: 11.1 g/dL — ABNORMAL LOW (ref 12.0–15.0)
Lymphocytes Relative: 38.9 % (ref 12.0–46.0)
Lymphs Abs: 1.7 10*3/uL (ref 0.7–4.0)
MCHC: 32.1 g/dL (ref 30.0–36.0)
MCV: 84.2 fl (ref 78.0–100.0)
Monocytes Absolute: 0.4 10*3/uL (ref 0.1–1.0)
Monocytes Relative: 7.9 % (ref 3.0–12.0)
Neutro Abs: 2.2 10*3/uL (ref 1.4–7.7)
Neutrophils Relative %: 50.1 % (ref 43.0–77.0)
Platelets: 246 10*3/uL (ref 150.0–400.0)
RBC: 4.09 Mil/uL (ref 3.87–5.11)
RDW: 13.7 % (ref 11.5–15.5)
WBC: 4.5 10*3/uL (ref 4.0–10.5)

## 2019-11-04 LAB — LIPID PANEL
Cholesterol: 186 mg/dL (ref 0–200)
HDL: 62.4 mg/dL (ref >39.00)
LDL Cholesterol: 114 mg/dL — ABNORMAL HIGH (ref 0–99)
NonHDL: 123.26
Total CHOL/HDL Ratio: 3
Triglycerides: 46 mg/dL (ref 0.0–149.0)
VLDL: 9.2 mg/dL (ref 0.0–40.0)

## 2019-11-04 LAB — COMPREHENSIVE METABOLIC PANEL
ALT: 9 U/L (ref 0–35)
AST: 16 U/L (ref 0–37)
Albumin: 4.1 g/dL (ref 3.5–5.2)
Alkaline Phosphatase: 41 U/L (ref 39–117)
BUN: 9 mg/dL (ref 6–23)
CO2: 24 mEq/L (ref 19–32)
Calcium: 9 mg/dL (ref 8.4–10.5)
Chloride: 104 mEq/L (ref 96–112)
Creatinine, Ser: 1.08 mg/dL (ref 0.40–1.20)
GFR: 67.24 mL/min (ref >60.00)
Glucose, Bld: 77 mg/dL (ref 70–99)
Potassium: 3.8 mEq/L (ref 3.5–5.1)
Sodium: 137 mEq/L (ref 135–145)
Total Bilirubin: 0.7 mg/dL (ref 0.2–1.2)
Total Protein: 7.1 g/dL (ref 6.0–8.3)

## 2019-11-04 LAB — HEMOGLOBIN A1C: Hgb A1c MFr Bld: 4.8 % (ref 4.6–6.5)

## 2019-11-05 LAB — HEPATITIS C ANTIBODY
Hepatitis C Ab: NONREACTIVE
SIGNAL TO CUT-OFF: 0.02 (ref <1.00)

## 2019-11-12 ENCOUNTER — Other Ambulatory Visit: Payer: Self-pay

## 2019-11-12 ENCOUNTER — Encounter: Payer: Self-pay | Admitting: Family Medicine

## 2019-11-12 ENCOUNTER — Ambulatory Visit: Payer: BC Managed Care – PPO | Admitting: Family Medicine

## 2019-11-12 VITALS — BP 120/74 | HR 83 | Temp 98.4°F | Ht 68.25 in

## 2019-11-12 DIAGNOSIS — F988 Other specified behavioral and emotional disorders with onset usually occurring in childhood and adolescence: Secondary | ICD-10-CM

## 2019-11-12 MED ORDER — METHYLPHENIDATE HCL ER (OSM) 18 MG PO TBCR: 18.0000 mg | EXTENDED_RELEASE_TABLET | Freq: Every day | ORAL | 0 refills | Status: DC

## 2019-11-12 NOTE — Patient Instructions (Signed)
Start concerta in AM.

## 2019-11-12 NOTE — Progress Notes (Signed)
ADD  Diagnosed in 2015 with testing.  Started on trial of short acting Adderall XL.Marland Kitchen stopped given hear racing. Helped with concentration a lot though.  She has been doing behavioral self treatment but it is not working as well.  Having forgetful spells, concentration issues. Working lately. Has a lot of stress. Going through a divorce.  Daughter has special needs.  Compliant with meds: benefit from med (ie increase in concentration): not on med yet. change in mood: She denies depression, no anxiety, no excessive worrying. change in appetite: Insomnia: no issues , getting 8 hours a night  tremor: compliant with behavioral modification:   PHQ2 0 GAD 0 PMH and SH reviewed  ROS: Per HPI unless specifically indicated in ROS section   Meds, vitals, and allergies reviewed.   GEN: nad, alert and oriented, affect wnl and appropriate HEENT: mucous membranes moist NECK: supple w/o LA CV: rrr.  PULM: ctab, no inc wob ABD: soft, +bs EXT: no edema CN 2-12 wnl, s/s/dtr wnl x4.  No tremor.   This visit occurred during the SARS-CoV-2 public health emergency.  Safety protocols were in place, including screening questions prior to the visit, additional usage of staff PPE, and extensive cleaning of exam room while observing appropriate contact time as indicated for disinfecting solutions.   COVID 19 screen:  No recent travel or known exposure to COVID19 The patient denies respiratory symptoms of COVID 19 at this time. The importance of social distancing was discussed today.    Assesment/Plan:   ADD (attention deficit disorder) Start concerta in AM. Follow up in 3 months for re-eval.  Reviewed med SE and behavoiral treatments for ADD.

## 2019-11-13 ENCOUNTER — Telehealth: Payer: Self-pay | Admitting: Family Medicine

## 2019-11-13 MED ORDER — MYDAYIS 12.5 MG PO CP24: ORAL_CAPSULE | ORAL | 0 refills | Status: DC

## 2019-11-13 NOTE — Telephone Encounter (Signed)
Attempted PA on CoverMyMeds.  Formulary Meds:  Vyvanse and MyDayis.  Patient must try and fail 3 alternatives..  I only see Adderall on historical medications.  Please advise.

## 2019-11-13 NOTE — Telephone Encounter (Signed)
Let pt know we will try MyDayis low dose . Call if SE.

## 2019-11-13 NOTE — Telephone Encounter (Signed)
Pt called stating she called the people you wanted her to call and the number below you need id number  [id number]  Group number [id number]  [id number]  rxbin  rxpcn is correct   Pt wanted to thank you for your help

## 2019-11-16 NOTE — Telephone Encounter (Signed)
Noted  

## 2019-11-16 NOTE — Telephone Encounter (Signed)
Patient left a voicemail stating that she checked her daughter's medication bottle and it is the same medication and dose that she has been prescribed. [place] stated that she will try the medication that they are insisting that she try because she does not want to disrupt her daughter getting her medication. Patient stated that she was to call back with this information.

## 2019-11-16 NOTE — Telephone Encounter (Signed)
Desree notified as instructed by telephone.  She wants to check her daughter's bottle to see exactly what is says because she had no issues getting hers filled.  She will call us back with name and dosage.

## 2019-11-17 ENCOUNTER — Telehealth: Payer: Self-pay | Admitting: *Deleted

## 2019-11-17 NOTE — Telephone Encounter (Signed)
Received fax from [facility] requesting PA for MyDayis 12.5 mg.  PA completed on CoverMyMeds and sent for review.  Can take up to 72 hours for a decision.

## 2019-11-18 NOTE — Telephone Encounter (Signed)
PA approved from 11/17/2019 through 11/17/2022.  Patient notified via telephone.  [facility] notified of approval via fax.

## 2019-11-18 NOTE — Telephone Encounter (Signed)
Patient called stating that she went to the pharmacy to get her medication and was advised that they are waiting on a PA. Advised patient that a PA has been submitted and it can take up to 72 hours. Advised patient as soon as we know something they will be notified.

## 2019-12-15 ENCOUNTER — Other Ambulatory Visit: Payer: Self-pay | Admitting: Family Medicine

## 2019-12-28 NOTE — Assessment & Plan Note (Signed)
Start concerta in AM. Follow up in 3 months for re-eval.  Reviewed med SE and behavoiral treatments for ADD.

## 2020-02-01 ENCOUNTER — Ambulatory Visit: Payer: BC Managed Care – PPO | Admitting: Family Medicine

## 2020-02-02 ENCOUNTER — Ambulatory Visit (INDEPENDENT_AMBULATORY_CARE_PROVIDER_SITE_OTHER)
Admission: RE | Admit: 2020-02-02 | Discharge: 2020-02-02 | Disposition: A | Discharge status: Home / Self Care | Payer: BC Managed Care – PPO | Source: Ambulatory Visit | Attending: Family Medicine | Admitting: Family Medicine

## 2020-02-02 ENCOUNTER — Encounter: Payer: Self-pay | Admitting: Family Medicine

## 2020-02-02 ENCOUNTER — Other Ambulatory Visit: Payer: Self-pay

## 2020-02-02 ENCOUNTER — Ambulatory Visit: Payer: BC Managed Care – PPO | Admitting: Family Medicine

## 2020-02-02 ENCOUNTER — Ambulatory Visit
Admission: RE | Admit: 2020-02-02 | Discharge: 2020-02-02 | Disposition: A | Discharge status: Home / Self Care | Payer: BC Managed Care – PPO | Source: Ambulatory Visit | Attending: Family Medicine | Admitting: Family Medicine

## 2020-02-02 VITALS — BP 104/72 | HR 71 | Temp 98.4°F | Ht 68.25 in

## 2020-02-02 DIAGNOSIS — M25562 Pain in left knee: Secondary | ICD-10-CM

## 2020-02-02 DIAGNOSIS — M25561 Pain in right knee: Secondary | ICD-10-CM | POA: Diagnosis not present

## 2020-02-02 DIAGNOSIS — F988 Other specified behavioral and emotional disorders with onset usually occurring in childhood and adolescence: Secondary | ICD-10-CM

## 2020-02-02 MED ORDER — MYDAYIS 12.5 MG PO CP24: ORAL_CAPSULE | ORAL | 0 refills | Status: DC

## 2020-02-02 MED ORDER — MELOXICAM 15 MG PO TABS: 15.0000 mg | ORAL_TABLET | Freq: Every day | ORAL | 0 refills | Status: DC

## 2020-02-02 MED ORDER — MYDAYIS 12.5 MG PO CP24
ORAL_CAPSULE | ORAL | 0 refills | Status: DC
Start: 2020-02-02 — End: 2020-03-21

## 2020-02-02 MED ORDER — MYDAYIS 12.5 MG PO CP24
ORAL_CAPSULE | ORAL | 0 refills | Status: DC
Start: 2020-02-02 — End: 2020-02-02
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
## 2020-02-02 NOTE — Assessment & Plan Note (Signed)
Possible meniscal tear. Start with meloxicam daily. Eval with X-ray.  Start home PT , ice.  Follow up if not improving in 2 weeks.

## 2020-02-02 NOTE — Addendum Note (Signed)
Addended by: Ellamae Sia on: 02/02/2020 05:10 PM   Modules accepted: Orders

## 2020-02-02 NOTE — Patient Instructions (Addendum)
Stay on  MyDayis consistently for 1-2 months... Call if does not seem to be helping.  Start meloxicam daily.  No high impact exercise.  We can call with X-ray results.  Call if pain not improving in 2 weeks.

## 2020-02-02 NOTE — Assessment & Plan Note (Signed)
She has no SE to Lucky... tolerating well. No palpitations as she had with Adderall. She has not been taking consistently.. wills tart and let me know if she does not have consistent improvement.. may need to increase dose.  Follow up in 3 months.

## 2020-02-04 NOTE — Telephone Encounter (Signed)
[place] Night - Client Nonclinical Telephone Record AccessNurse Client [place] Night - Client Client Site [place] Physician Eliezer Lofts - MD Contact Type Call Who Is Calling Patient / Member / Family / Caregiver Caller Name Moore Phone Number [phone] Patient Name Brittany [name] Patient DOB 04-Aug-1975 Call Type Message Only Information Provided Reason for Call Medication Question / Request Initial Comment Caller requesting return call to refill medication. Additional Comment It is for [place]. Time Disposition Final User 02/04/2020 6:59:34 AM General Information Provided Yes Jaclyn Prime Call Closed By: Jaclyn Prime Transaction Date/Time: 02/04/2020 6:58:05 AM (ET)

## 2020-03-17 ENCOUNTER — Telehealth: Payer: Self-pay

## 2020-03-17 NOTE — Telephone Encounter (Signed)
Pt left v/m that she was seen 02/02/20 and is not sure if her Mydayis 12.5 mg will be left the same dose this month or increased. See office note on 02/02/20. Pt last got Mydayis 12.5 mg #30 on 02/02/20. Pt has 2 pills left. Pt last seen 02/02/20 and next appt is CPX on 05/10/20.

## 2020-03-18 NOTE — Telephone Encounter (Signed)
Call Are her symptoms well controlled on the 12.5 mg dose or does she think there is room for improvement. That is how I would determine need for med increase.

## 2020-03-21 MED ORDER — MYDAYIS 25 MG PO CP24: 25.0000 mg | ORAL_CAPSULE | Freq: Every day | ORAL | 0 refills | Status: DC

## 2020-03-21 NOTE — Telephone Encounter (Signed)
Tennelle notified as instructed by telephone.  Patient states understanding. She is already scheduled for her CPE 05/10/2020.  Ok to wait until then or do we need to make a separate follow up appointment?  Patient states that if she has any issues on new dose she would definitely call us.

## 2020-03-21 NOTE — Telephone Encounter (Signed)
Okay to wait until then

## 2020-03-21 NOTE — Telephone Encounter (Signed)
I have sent in a 30 day prescription for a higher dose ( next step up is 25 mg, do not cut tablet as it is extended release). Have her mak an appt for follow up to reassess in 1 month if she does not already have one.

## 2020-03-21 NOTE — Telephone Encounter (Signed)
Left message for Brittany [name] to return my call.

## 2020-03-21 NOTE — Telephone Encounter (Signed)
Spoke with Hinton Dyer.  She states she she can feel a difference but definitely feels there is room for improvement.  Please advise.

## 2020-03-22 ENCOUNTER — Telehealth: Payer: Self-pay | Admitting: *Deleted

## 2020-03-22 NOTE — Telephone Encounter (Signed)
[facility] PA for Mydayis 25 mg.  PA completed on CoverMyMeds and approved.  [facility] notified of approval via fax.

## 2020-04-05 ENCOUNTER — Ambulatory Visit: Payer: BC Managed Care – PPO | Admitting: Internal Medicine

## 2020-04-06 ENCOUNTER — Telehealth (INDEPENDENT_AMBULATORY_CARE_PROVIDER_SITE_OTHER): Payer: BC Managed Care – PPO | Admitting: Family Medicine

## 2020-04-06 ENCOUNTER — Other Ambulatory Visit: Payer: Self-pay

## 2020-04-06 ENCOUNTER — Other Ambulatory Visit: Payer: Self-pay | Admitting: Family Medicine

## 2020-04-06 ENCOUNTER — Encounter: Payer: Self-pay | Admitting: Family Medicine

## 2020-04-06 ENCOUNTER — Other Ambulatory Visit (INDEPENDENT_AMBULATORY_CARE_PROVIDER_SITE_OTHER): Payer: BC Managed Care – PPO

## 2020-04-06 VITALS — Ht 68.25 in

## 2020-04-06 DIAGNOSIS — Z20822 Contact with and (suspected) exposure to covid-19: Secondary | ICD-10-CM

## 2020-04-06 DIAGNOSIS — U071 COVID-19: Secondary | ICD-10-CM

## 2020-04-06 NOTE — Progress Notes (Signed)
covid test

## 2020-04-06 NOTE — Progress Notes (Signed)
Work note written as instructed by Dr. Lorelei Pont and sent to patient via her MyChart.

## 2020-04-06 NOTE — Progress Notes (Signed)
      Brittany Sanroman T. Brittany Boedecker, MD Primary Care and [place] at [facility] [place], [address] Phone: [phone]  FAX: [place] - 45 y.o. female  MRN [id number]  Date of Birth: 1975-04-18  Visit Date: 04/06/2020  PCP: [name], MD  Referred by: [name], MD  Virtual Visit via Video Note:  I connected with  Brittany [name] on 04/06/2020 11:40 AM EST by a video enabled telemedicine application and verified that I am speaking with the correct person using two identifiers.   Location patient: home computer, tablet, or smartphone Location provider: work or home office Consent: Verbal consent directly obtained from [employer]. Persons participating in the virtual visit: patient, provider  I discussed the limitations of evaluation and management by telemedicine and the availability of in person appointments. The patient expressed understanding and agreed to proceed.  Chief Complaint  Patient presents with  . Sore Throat  . Nasal Congestion  . Nausea  . Cough    Daughter tested positive for Covid    History of Present Illness:  She is a very well-known nice patient, and she presents with some symptoms that began on Sunday.  She currently has a sore throat, some nasal congestion as well as nausea.  She also has a cough.  She does have asthma, and she has felt a little bit short of breath.  Right now she is doing albuterol scheduled every 4 hours, and this is helped quite a bit.  She has taken a home COVID test that is positive, and one of her daughters is also positive for COVID.  Review of Systems as above: See pertinent positives and pertinent negatives per HPI No acute distress verbally   Observations/Objective/Exam:  An attempt was made to discern vital signs over the phone and per patient if applicable and possible.   General:    Alert, Oriented, appears well and in no acute  distress  Pulmonary:     On inspection no signs of respiratory distress.  Psych / Neurological:     Pleasant and cooperative.  Assessment and Plan:    ICD-10-CM   1. COVID-19  U07.1    She has at home positive COVID test, so we will arrange for a PCR test to be done today.  Additionally, with the length of her symptoms and her current symptoms, I do not think it is appropriate her to go to work until Monday.  I will give her a note saying such.  I discussed the assessment and treatment plan with the patient. The patient was provided an opportunity to ask questions and all were answered. The patient agreed with the plan and demonstrated an understanding of the instructions.   The patient was advised to call back or seek an in-person evaluation if the symptoms worsen or if the condition fails to improve as anticipated.  Signed,  Maud Deed. Dondra Rhett, MD

## 2020-04-08 LAB — SARS-COV-2, NAA 2 DAY TAT

## 2020-04-08 LAB — NOVEL CORONAVIRUS, NAA: SARS-CoV-2, NAA: DETECTED — AB

## 2020-04-12 ENCOUNTER — Encounter: Payer: Self-pay | Admitting: Family Medicine

## 2020-04-13 NOTE — Telephone Encounter (Signed)
Can you extend her work note until Friday?

## 2020-04-19 MED ORDER — MYDAYIS 25 MG PO CP24: 25.0000 mg | ORAL_CAPSULE | Freq: Every day | ORAL | 0 refills | Status: DC

## 2020-04-19 NOTE — Telephone Encounter (Signed)
Last office visit 04/06/2020 for Covid with Dr. Lorelei Pont.  Last refilled 03/21/2020 for #30 with no refills.  CPE scheduled for 05/10/2020.

## 2020-04-23 ENCOUNTER — Telehealth: Payer: Self-pay | Admitting: Family Medicine

## 2020-04-23 DIAGNOSIS — E7800 Pure hypercholesterolemia, unspecified: Secondary | ICD-10-CM

## 2020-04-23 DIAGNOSIS — D509 Iron deficiency anemia, unspecified: Secondary | ICD-10-CM

## 2020-04-23 NOTE — Telephone Encounter (Signed)
-----   Message from Ellamae Sia sent at 04/18/2020 11:01 AM EST ----- Regarding: Lab orders for Thursday, 2.10.22 Patient is scheduled for CPX labs, please order future labs, Thanks , Karna Christmas

## 2020-05-03 ENCOUNTER — Other Ambulatory Visit: Payer: Self-pay

## 2020-05-03 MED ORDER — EPINEPHRINE 0.3 MG/0.3ML IJ SOAJ: INTRAMUSCULAR | 0 refills | Status: DC

## 2020-05-03 MED ORDER — MONTELUKAST SODIUM 10 MG PO TABS: 10.0000 mg | ORAL_TABLET | Freq: Every day | ORAL | 0 refills | Status: DC

## 2020-05-03 MED ORDER — ALBUTEROL SULFATE (2.5 MG/3ML) 0.083% IN NEBU: INHALATION_SOLUTION | RESPIRATORY_TRACT | 1 refills | Status: DC

## 2020-05-05 ENCOUNTER — Other Ambulatory Visit (INDEPENDENT_AMBULATORY_CARE_PROVIDER_SITE_OTHER): Payer: BC Managed Care – PPO

## 2020-05-05 ENCOUNTER — Other Ambulatory Visit: Payer: Self-pay

## 2020-05-05 DIAGNOSIS — D509 Iron deficiency anemia, unspecified: Secondary | ICD-10-CM | POA: Diagnosis not present

## 2020-05-05 DIAGNOSIS — E7800 Pure hypercholesterolemia, unspecified: Secondary | ICD-10-CM

## 2020-05-05 LAB — CBC WITH DIFFERENTIAL/PLATELET
Basophils Absolute: 0.1 10*3/uL (ref 0.0–0.1)
Basophils Relative: 1.3 % (ref 0.0–3.0)
Eosinophils Absolute: 0 10*3/uL (ref 0.0–0.7)
Eosinophils Relative: 0.8 % (ref 0.0–5.0)
HCT: 36.3 % (ref 36.0–46.0)
Hemoglobin: 11.8 g/dL — ABNORMAL LOW (ref 12.0–15.0)
Lymphocytes Relative: 38.7 % (ref 12.0–46.0)
Lymphs Abs: 1.8 10*3/uL (ref 0.7–4.0)
MCHC: 32.4 g/dL (ref 30.0–36.0)
MCV: 86.2 fl (ref 78.0–100.0)
Monocytes Absolute: 0.3 10*3/uL (ref 0.1–1.0)
Monocytes Relative: 6.2 % (ref 3.0–12.0)
Neutro Abs: 2.4 10*3/uL (ref 1.4–7.7)
Neutrophils Relative %: 53 % (ref 43.0–77.0)
Platelets: 247 10*3/uL (ref 150.0–400.0)
RBC: 4.22 Mil/uL (ref 3.87–5.11)
RDW: 13.9 % (ref 11.5–15.5)
WBC: 4.6 10*3/uL (ref 4.0–10.5)

## 2020-05-05 LAB — COMPREHENSIVE METABOLIC PANEL
ALT: 10 U/L (ref 0–35)
AST: 16 U/L (ref 0–37)
Albumin: 4.1 g/dL (ref 3.5–5.2)
Alkaline Phosphatase: 39 U/L (ref 39–117)
BUN: 8 mg/dL (ref 6–23)
CO2: 29 mEq/L (ref 19–32)
Calcium: 9.2 mg/dL (ref 8.4–10.5)
Chloride: 103 mEq/L (ref 96–112)
Creatinine, Ser: 1.05 mg/dL (ref 0.40–1.20)
GFR: 65.42 mL/min (ref >60.00)
Glucose, Bld: 81 mg/dL (ref 70–99)
Potassium: 3.9 mEq/L (ref 3.5–5.1)
Sodium: 138 mEq/L (ref 135–145)
Total Bilirubin: 0.9 mg/dL (ref 0.2–1.2)
Total Protein: 7.1 g/dL (ref 6.0–8.3)

## 2020-05-05 LAB — LIPID PANEL
Cholesterol: 170 mg/dL (ref 0–200)
HDL: 50.4 mg/dL (ref >39.00)
LDL Cholesterol: 110 mg/dL — ABNORMAL HIGH (ref 0–99)
NonHDL: 119.9
Total CHOL/HDL Ratio: 3
Triglycerides: 49 mg/dL (ref 0.0–149.0)
VLDL: 9.8 mg/dL (ref 0.0–40.0)

## 2020-05-05 NOTE — Progress Notes (Signed)
No critical labs need to be addressed urgently. We will discuss labs in detail at upcoming office visit.   

## 2020-05-10 ENCOUNTER — Ambulatory Visit (INDEPENDENT_AMBULATORY_CARE_PROVIDER_SITE_OTHER): Payer: BC Managed Care – PPO | Admitting: Family Medicine

## 2020-05-10 ENCOUNTER — Other Ambulatory Visit: Payer: Self-pay

## 2020-05-10 ENCOUNTER — Encounter: Payer: Self-pay | Admitting: Family Medicine

## 2020-05-10 VITALS — BP 120/80 | HR 72 | Temp 98.2°F | Ht 69.5 in | Wt 256.2 lb

## 2020-05-10 DIAGNOSIS — D509 Iron deficiency anemia, unspecified: Secondary | ICD-10-CM

## 2020-05-10 DIAGNOSIS — E7800 Pure hypercholesterolemia, unspecified: Secondary | ICD-10-CM

## 2020-05-10 DIAGNOSIS — J4540 Moderate persistent asthma, uncomplicated: Secondary | ICD-10-CM

## 2020-05-10 DIAGNOSIS — Z0000 Encounter for general adult medical examination without abnormal findings: Secondary | ICD-10-CM

## 2020-05-10 DIAGNOSIS — F988 Other specified behavioral and emotional disorders with onset usually occurring in childhood and adolescence: Secondary | ICD-10-CM

## 2020-05-10 NOTE — Patient Instructions (Signed)
Keep working on healthy eating and regular exercise.  Preventive Care 43-43 Years Old, Female Preventive care refers to lifestyle choices and visits with your health care provider that can promote health and wellness. This includes:  A yearly physical exam. This is also called an annual wellness visit.  Regular dental and eye exams.  Immunizations.  Screening for certain conditions.  Healthy lifestyle choices, such as: ? Eating a healthy diet. ? Getting regular exercise. ? Not using drugs or products that contain nicotine and tobacco. ? Limiting alcohol use. What can I expect for my preventive care visit? Physical exam Your health care provider will check your:  Height and weight. These may be used to calculate your BMI (body mass index). BMI is a measurement that tells if you are at a healthy weight.  Heart rate and blood pressure.  Body temperature.  Skin for abnormal spots. Counseling Your health care provider may ask you questions about your:  Past medical problems.  Family's medical history.  Alcohol, tobacco, and drug use.  Emotional well-being.  Home life and relationship well-being.  Sexual activity.  Diet, exercise, and sleep habits.  Work and work Statistician.  Access to firearms.  Method of birth control.  Menstrual cycle.  Pregnancy history. What immunizations do I need? Vaccines are usually given at various ages, according to a schedule. Your health care provider will recommend vaccines for you based on your age, medical history, and lifestyle or other factors, such as travel or where you work.   What tests do I need? Blood tests  Lipid and cholesterol levels. These may be checked every 5 years, or more often if you are over 22 years old.  Hepatitis C test.  Hepatitis B test. Screening  Lung cancer screening. You may have this screening every year starting at age 43 if you have a 30-pack-year history of smoking and currently smoke or  have quit within the past 15 years.  Colorectal cancer screening. ? All adults should have this screening starting at age 43 and continuing until age 43. ? Your health care provider may recommend screening at age 43 if you are at increased risk. ? You will have tests every 1-10 years, depending on your results and the type of screening test.  Diabetes screening. ? This is done by checking your blood sugar (glucose) after you have not eaten for a while (fasting). ? You may have this done every 1-3 years.  Mammogram. ? This may be done every 1-2 years. ? Talk with your health care provider about when you should start having regular mammograms. This may depend on whether you have a family history of breast cancer.  BRCA-related cancer screening. This may be done if you have a family history of breast, ovarian, tubal, or peritoneal cancers.  Pelvic exam and Pap test. ? This may be done every 3 years starting at age 43. ? Starting at age 43, this may be done every 5 years if you have a Pap test in combination with an HPV test. Other tests  STD (sexually transmitted disease) testing, if you are at risk.  Bone density scan. This is done to screen for osteoporosis. You may have this scan if you are at high risk for osteoporosis. Talk with your health care provider about your test results, treatment options, and if necessary, the need for more tests. You may have this scan if you are at high risk for osteoporosis. Talk with your health care provider about your test results, treatment options, and if necessary, the need for more tests. Follow these instructions at home: Eating and drinking  Eat a diet that includes fresh fruits and vegetables, whole grains, lean protein, and  low-fat dairy products.  Take vitamin and mineral supplements as recommended by your health care provider.  Do not drink alcohol if: ? Your health care provider tells you not to drink. ? You are pregnant, may be pregnant, or are planning to become pregnant.  If you drink alcohol: ? Limit how much you have to 0-1 drink a day. ? Be aware of how much alcohol is in your drink. In the [place] one drink  equals one 12 oz bottle of beer (355 mL), one 5 oz glass of wine (148 mL), or one 1 oz glass of hard liquor (44 mL).   Lifestyle  Take daily care of your teeth and gums. Brush your teeth every morning and night with fluoride toothpaste. Floss one time each day.  Stay active. Exercise for at least 30 minutes 5 or more days each week.  Do not use any products that contain nicotine or tobacco, such as cigarettes, e-cigarettes, and chewing tobacco. If you need help quitting, ask your health care provider.  Do not use drugs.  If you are sexually active, practice safe sex. Use a condom or other form of protection to prevent STIs (sexually transmitted infections).  If you do not wish to become pregnant, use a form of birth control. If you plan to become pregnant, see your health care provider for a prepregnancy visit.  If told by your health care provider, take low-dose aspirin daily starting at age 65.  Find healthy ways to cope with stress, such as: ? Meditation, yoga, or listening to music. ? Journaling. ? Talking to a trusted person. ? Spending time with friends and family. Safety  Always wear your seat belt while driving or riding in a vehicle.  Do not drive: ? If you have been drinking alcohol. Do not ride with someone who has been drinking. ? When you are tired or distracted. ? While texting.  Wear a helmet and other protective equipment during sports activities.  If you have firearms in your house, make sure you follow all gun safety procedures. What's next?  Visit your health care provider once a year for an annual wellness visit.  Ask your health care provider how often you should have your eyes and teeth checked.  Stay up to date on all vaccines. This information is not intended to replace advice given to you by your health care provider. Make sure you discuss any questions you have with your health care provider. Document Revised: 12/15/2019 Document Reviewed:  11/21/2017 Elsevier Patient Education  2021 [employer].

## 2020-05-10 NOTE — Progress Notes (Signed)
Patient ID: Brittany [name], female    DOB: Apr 08, 1975, 45 y.o.   MRN: [id number]  This visit was conducted in person.  BP 120/80   Pulse 72   Temp 98.2 F (36.8 C) (Temporal)   Ht 5' 9.5" (1.765 m)   Wt 256 lb 4 oz (116.2 kg)   LMP 04/25/2020   BMI 37.30 kg/m    CC:  Chief Complaint  Patient presents with  . Annual Exam    Subjective:   HPI: Brittany [name] is a 45 y.o. female presenting on 05/10/2020 for Annual Exam    ADD  She has noted an improvement in attention with the  [place] 25 mg No palpitations. BP Readings from Last 3 Encounters:  05/10/20 120/80  02/02/20 104/72  11/12/19 120/74    Moderate persistent asthma: no flares using  QVAR daily,  albuterol prn rarely.  She has recovered with COVID  In 03/2020.. now back at baseline.  PCOS working on weight loss..   Now off  metformin . On birth control. Wt Readings from Last 3 Encounters:  05/10/20 256 lb 4 oz (116.2 kg)  05/05/19 261 lb (118.4 kg)  02/05/19 269 lb (122 kg)    She is slightly anemic. Has heavy menses.. now on    Dx of celiac disease in 11/2018 [facility] GI Dr. Watt Climes  Has follow up.  .reviewed labs in detail with pt. Lab Results  Component Value Date   CHOL 170 05/05/2020   HDL 50.40 05/05/2020   LDLCALC 110 (H) 05/05/2020   TRIG 49.0 05/05/2020   CHOLHDL 3 05/05/2020  The 10-year ASCVD risk score Mikey Bussing DC Jr., et al., 2013) is: 0.6%   Values used to calculate the score:     Age: 32 years     Sex: Female     Is Non-Hispanic African American: Yes     Diabetic: No     Tobacco smoker: No     Systolic Blood Pressure: 676 mmHg     Is BP treated: No     HDL Cholesterol: 50.4 mg/dL     Total Cholesterol: 170 mg/dL   Exercise: Using online personal trainer.. 2 times a week.  Diet: healthy       Relevant past medical, surgical, family and social history reviewed and updated as indicated. Interim medical history since our last visit reviewed. Allergies and medications reviewed and  updated. Outpatient Medications Prior to Visit  Medication Sig Dispense Refill  . albuterol (PROVENTIL) (2.5 MG/3ML) 0.083% nebulizer solution TAKE 3 MLS BY NEBULIZATION EVERY 6 (SIX) HOURS AS NEEDED FOR WHEEZING OR SHORTNESS OF BREATH. 150 mL 1  . albuterol (VENTOLIN HFA) 108 (90 Base) MCG/ACT inhaler TAKE 2 PUFFS BY MOUTH EVERY 6 HOURS AS NEEDED FOR WHEEZE OR SHORTNESS OF BREATH 18 each 2  . Amphet-Dextroamphet 3-Bead ER (MYDAYIS) 25 MG CP24 Take 25 mg by mouth daily. 30 capsule 0  . EPINEPHrine 0.3 mg/0.3 mL IJ SOAJ injection INJECT AS DIRECTED 2 each 0  . L-Lysine 1000 MG TABS Take 2 tablets by mouth daily.    . meloxicam (MOBIC) 15 MG tablet Take 1 tablet (15 mg total) by mouth daily. 15 tablet 0  . montelukast (SINGULAIR) 10 MG tablet Take 1 tablet (10 mg total) by mouth at bedtime. 90 tablet 0  . norgestrel-ethinyl estradiol (LOW-OGESTREL) 0.3-30 MG-MCG tablet Take 1 tablet by mouth daily.    . Nutritional Supplements (NUTRITIONAL SUPPLEMENT PO) Smarty Pants Prenatals-1 tablet by mouth daily    .  Omega-3 1000 MG CAPS Take 1,000 mg by mouth daily.    Marland Kitchen QUERCETIN PO Take 1 tablet by mouth daily.    Marland Kitchen QVAR REDIHALER 40 MCG/ACT inhaler TAKE 2 PUFFS BY MOUTH TWICE A DAY 10.6 g 11  . Turmeric 500 MG TABS Take by mouth. Take 2-3 tablets by mouth daily    . VITAMIN D PO 25 mcg. Take 1/2 tablet daily    . zinc gluconate 50 MG tablet Take 25 mg by mouth daily.    . LO LOESTRIN FE 1 MG-10 MCG / 10 MCG tablet Take 1 tablet by mouth daily.     No facility-administered medications prior to visit.     Per HPI unless specifically indicated in ROS section below Review of Systems Objective:  BP 120/80   Pulse 72   Temp 98.2 F (36.8 C) (Temporal)   Ht 5' 9.5" (1.765 m)   Wt 256 lb 4 oz (116.2 kg)   LMP 04/25/2020   BMI 37.30 kg/m   Wt Readings from Last 3 Encounters:  05/10/20 256 lb 4 oz (116.2 kg)  05/05/19 261 lb (118.4 kg)  02/05/19 269 lb (122 kg)      Physical Exam    Results  for orders placed or performed in visit on 05/05/20  CBC with Differential/Platelet  Result Value Ref Range   WBC 4.6 4.0 - 10.5 K/uL   RBC 4.22 3.87 - 5.11 Mil/uL   Hemoglobin 11.8 (L) 12.0 - 15.0 g/dL   HCT 36.3 36.0 - 46.0 %   MCV 86.2 78.0 - 100.0 fl   MCHC 32.4 30.0 - 36.0 g/dL   RDW 13.9 11.5 - 15.5 %   Platelets 247.0 150.0 - 400.0 K/uL   Neutrophils Relative % 53.0 43.0 - 77.0 %   Lymphocytes Relative 38.7 12.0 - 46.0 %   Monocytes Relative 6.2 3.0 - 12.0 %   Eosinophils Relative 0.8 0.0 - 5.0 %   Basophils Relative 1.3 0.0 - 3.0 %   Neutro Abs 2.4 1.4 - 7.7 K/uL   Lymphs Abs 1.8 0.7 - 4.0 K/uL   Monocytes Absolute 0.3 0.1 - 1.0 K/uL   Eosinophils Absolute 0.0 0.0 - 0.7 K/uL   Basophils Absolute 0.1 0.0 - 0.1 K/uL  Comprehensive metabolic panel  Result Value Ref Range   Sodium 138 135 - 145 mEq/L   Potassium 3.9 3.5 - 5.1 mEq/L   Chloride 103 96 - 112 mEq/L   CO2 29 19 - 32 mEq/L   Glucose, Bld 81 70 - 99 mg/dL   BUN 8 6 - 23 mg/dL   Creatinine, Ser 1.05 0.40 - 1.20 mg/dL   Total Bilirubin 0.9 0.2 - 1.2 mg/dL   Alkaline Phosphatase 39 39 - 117 U/L   AST 16 0 - 37 U/L   ALT 10 0 - 35 U/L   Total Protein 7.1 6.0 - 8.3 g/dL   Albumin 4.1 3.5 - 5.2 g/dL   GFR 65.42 >60.00 mL/min   Calcium 9.2 8.4 - 10.5 mg/dL  Lipid panel  Result Value Ref Range   Cholesterol 170 0 - 200 mg/dL   Triglycerides 49.0 0.0 - 149.0 mg/dL   HDL 50.40 >39.00 mg/dL   VLDL 9.8 0.0 - 40.0 mg/dL   LDL Cholesterol 110 (H) 0 - 99 mg/dL   Total CHOL/HDL Ratio 3    NonHDL 119.90     This visit occurred during the SARS-CoV-2 public health emergency.  Safety protocols were in place, including screening questions prior  to the visit, additional usage of staff PPE, and extensive cleaning of exam room while observing appropriate contact time as indicated for disinfecting solutions.   COVID 19 screen:  No recent travel or known exposure to COVID19 The patient denies respiratory symptoms of COVID 19  at this time. The importance of social distancing was discussed today.   Assessment and Plan The patient's preventative maintenance and recommended screening tests for an annual wellness exam were reviewed in full today. Brought up to date unless services declined.  Counselled on the importance of diet, exercise, and its role in overall health and mortality. The patient's FH and SH was reviewed, including their home life, tobacco status, and drug and alcohol status.      Vaccines: consider Tdap Pap/DVE:GYN 09/2017 Mammo:no early family history of breast cancer, 10/2018 neg.Marland Kitchen  Per GYN Colon:no early family history of colon cancer Smoking Status:none ETOH/ drug use  occ/none nonsmoker HIV screen: refused  Eliezer Lofts, MD

## 2020-05-16 ENCOUNTER — Other Ambulatory Visit: Payer: Self-pay | Admitting: Family Medicine

## 2020-05-30 MED ORDER — MYDAYIS 25 MG PO CP24: 25.0000 mg | ORAL_CAPSULE | Freq: Every day | ORAL | 0 refills | Status: DC

## 2020-05-30 NOTE — Telephone Encounter (Signed)
Last office visit 05/10/2020 for CPE.  Last refilled 04/19/2020 for #30 with no refills.  No future appointments.

## 2020-05-30 NOTE — Addendum Note (Signed)
Addended by: Carter Kitten on: 05/30/2020 08:58 AM   Modules accepted: Orders

## 2020-06-30 MED ORDER — MYDAYIS 25 MG PO CP24: 25.0000 mg | ORAL_CAPSULE | Freq: Every day | ORAL | 0 refills | Status: DC

## 2020-06-30 NOTE — Telephone Encounter (Signed)
Please schedule Brittany [name] a follow up ADD appointment with Dr. Diona Browner prior to May 7.

## 2020-06-30 NOTE — Assessment & Plan Note (Signed)
Encouraged exercise, weight loss, healthy eating habits.

## 2020-06-30 NOTE — Telephone Encounter (Signed)
Spoke with patient schedule appointment.

## 2020-06-30 NOTE — Assessment & Plan Note (Signed)
No flares, using  QVAR daily,  albuterol prn rarely.

## 2020-06-30 NOTE — Assessment & Plan Note (Signed)
LDL at goal . Encouraged exercise, weight loss, healthy eating habits.

## 2020-06-30 NOTE — Telephone Encounter (Signed)
Sent in refill for 30 days. Call... will need appt in May for further refills.. will give 3 months at that time.

## 2020-06-30 NOTE — Assessment & Plan Note (Addendum)
Stable, chronic.  Continue current medication. Follow up in 3 months for repeat ADD eval. PDMP reviewed during this encounter.  Mydaysi 25 mg daily

## 2020-06-30 NOTE — Telephone Encounter (Signed)
Last office visit 05/10/2020 for CPE.  Last refilled 05/30/2020 for #30 with no refills.  No future appointments.

## 2020-07-17 ENCOUNTER — Other Ambulatory Visit: Payer: Self-pay | Admitting: Family Medicine

## 2020-07-22 ENCOUNTER — Ambulatory Visit: Payer: BC Managed Care – PPO | Admitting: Family Medicine

## 2020-08-02 ENCOUNTER — Ambulatory Visit: Payer: BC Managed Care – PPO | Admitting: Family Medicine

## 2020-08-02 ENCOUNTER — Other Ambulatory Visit: Payer: Self-pay

## 2020-08-02 MED ORDER — MYDAYIS 25 MG PO CP24: 25.0000 mg | ORAL_CAPSULE | Freq: Every day | ORAL | 0 refills | Status: DC

## 2020-08-02 NOTE — Telephone Encounter (Signed)
Pharmacy requests refill on: Amphetamine-Dextroamphetamine 25 mg 24 hr   LAST REFILL: 06/30/2020 (Q-30, R-0) LAST OV: 05/10/2020 NEXT OV: 08/05/2020 PHARMACY: [id number] [place], [place]

## 2020-08-05 ENCOUNTER — Other Ambulatory Visit: Payer: Self-pay

## 2020-08-05 ENCOUNTER — Ambulatory Visit: Payer: BC Managed Care – PPO | Admitting: Family Medicine

## 2020-08-05 ENCOUNTER — Encounter: Payer: Self-pay | Admitting: Family Medicine

## 2020-08-05 VITALS — BP 126/78 | HR 80 | Temp 97.9°F | Ht 69.5 in | Wt 256.0 lb

## 2020-08-05 DIAGNOSIS — R3915 Urgency of urination: Secondary | ICD-10-CM

## 2020-08-05 DIAGNOSIS — F988 Other specified behavioral and emotional disorders with onset usually occurring in childhood and adolescence: Secondary | ICD-10-CM | POA: Diagnosis not present

## 2020-08-05 LAB — POC URINALSYSI DIPSTICK (AUTOMATED)
Bilirubin, UA: NEGATIVE
Blood, UA: NEGATIVE
Glucose, UA: NEGATIVE
Ketones, UA: NEGATIVE
Leukocytes, UA: NEGATIVE
Nitrite, UA: NEGATIVE
Protein, UA: NEGATIVE
Spec Grav, UA: 1.015 (ref 1.010–1.025)
Urobilinogen, UA: 0.2 E.U./dL
pH, UA: 6 (ref 5.0–8.0)

## 2020-08-05 MED ORDER — MYDAYIS 37.5 MG PO CP24: 37.5000 mg | ORAL_CAPSULE | Freq: Every day | ORAL | 0 refills | Status: DC

## 2020-08-05 NOTE — Patient Instructions (Signed)
Increase dose to 37.5 mg daily.  Keep a diary of symptoms if able.

## 2020-08-05 NOTE — Progress Notes (Signed)
ADD Compliant with meds: Mydaysi 25 mg  benefit from med (ie increase in concentration): yes change in mood: none change in appetite: none Insomnia: none Tremor: none  no palpitations compliant with behavioral modification: yes  BP Readings from Last 3 Encounters:  08/05/20 126/78  05/10/20 120/80  02/02/20 104/72   Wt Readings from Last 3 Encounters:  08/05/20 256 lb (116.1 kg)  05/10/20 256 lb 4 oz (116.2 kg)  05/05/19 261 lb (118.4 kg)     PMH and SH reviewed  ROS: Per HPI unless specifically indicated in ROS section   Meds, vitals, and allergies reviewed.   GEN: nad, alert and oriented, affect wnl and appropriate HEENT: mucous membranes moist NECK: supple w/o LA CV: rrr.  PULM: ctab, no inc wob ABD: soft, +bs EXT: no edema CN 2-12 wnl, s/s/dtr wnl x4.  No tremor.   Problem List Items Addressed This Visit    ADD (attention deficit disorder)    Improvement but not ideally controlled. Increase dose to 37.5 mg daily ... keep diary of symptoms and re-eval in 1 month.  No SE, BP and pulse well controlled.      Urinary urgency - Primary    Urinalysis clear.      Relevant Orders   POCT Urinalysis Dipstick (Automated) (Completed)

## 2020-08-06 DIAGNOSIS — R3915 Urgency of urination: Secondary | ICD-10-CM | POA: Insufficient documentation

## 2020-08-06 NOTE — Assessment & Plan Note (Signed)
Urinalysis clear.

## 2020-08-06 NOTE — Assessment & Plan Note (Signed)
Improvement but not ideally controlled. Increase dose to 37.5 mg daily ... keep diary of symptoms and re-eval in 1 month.  No SE, BP and pulse well controlled.

## 2020-08-08 ENCOUNTER — Encounter: Payer: Self-pay | Admitting: Gastroenterology

## 2020-08-25 ENCOUNTER — Telehealth: Payer: Self-pay

## 2020-08-25 NOTE — Telephone Encounter (Signed)
[place] Night - Client Nonclinical Telephone Record AccessNurse Client [place] [facility] - Client Client Site [place] Physician Eliezer Lofts - MD Contact Type Call Who Is Calling Patient / Member / Family / Caregiver Caller Name [place] Phone Number [phone] Call Type Message Only Information Provided Reason for Call Returning a Call from the Office Initial Wayne states she is following up on a message she sent through the portal regarding her medication. She also missed a phone call yesterday. Additional Comment Please call back as soon as possible. Disp. Time Disposition Final User 08/25/2020 6:05:59 AM General Information Provided Yes Clydene Laming, Amy Call Closed By: [place] [facility] Transaction Date/Time: 08/25/2020 6:03:52 AM (ET)

## 2020-08-29 NOTE — Telephone Encounter (Signed)
Spoke with Brittany [name].  She states her pulse was 100 today and she felt like her BP may be up.  She is scheduled to see Dr. Diona Browner on 09/01/2020.  She states she will go to her parents house and try to check her BP there.  She states she is still taking the Mydayis 25 mg but thinks she will hold it until she sees Dr. Diona Browner on Thursday.

## 2020-08-29 NOTE — Telephone Encounter (Signed)
Received fax from [employer] following up on PA.  PA completed and sent for review.  Can take up to 72 hours for a decision.

## 2020-08-29 NOTE — Telephone Encounter (Signed)
Noted and agree with plan.

## 2020-08-30 NOTE — Telephone Encounter (Signed)
PA approved from 08/29/2020 through 08/30/2023.

## 2020-09-01 ENCOUNTER — Ambulatory Visit: Payer: BC Managed Care – PPO | Admitting: Family Medicine

## 2020-09-09 ENCOUNTER — Ambulatory Visit: Payer: BC Managed Care – PPO | Admitting: Family Medicine

## 2020-09-12 ENCOUNTER — Ambulatory Visit: Payer: BC Managed Care – PPO | Admitting: Gastroenterology

## 2020-09-12 ENCOUNTER — Encounter: Payer: Self-pay | Admitting: Gastroenterology

## 2020-09-12 ENCOUNTER — Other Ambulatory Visit: Payer: BC Managed Care – PPO

## 2020-09-12 VITALS — BP 116/90 | HR 76 | Ht 68.75 in | Wt 254.2 lb

## 2020-09-12 DIAGNOSIS — R194 Change in bowel habit: Secondary | ICD-10-CM

## 2020-09-12 DIAGNOSIS — K900 Celiac disease: Secondary | ICD-10-CM

## 2020-09-12 DIAGNOSIS — K5900 Constipation, unspecified: Secondary | ICD-10-CM

## 2020-09-12 DIAGNOSIS — Z8379 Family history of other diseases of the digestive system: Secondary | ICD-10-CM

## 2020-09-12 MED ORDER — PLENVU 140 G PO SOLR: ORAL | 0 refills | Status: DC

## 2020-09-12 NOTE — Progress Notes (Signed)
09/12/2020 Brittany [name] [id number] Apr 01, 1975   HISTORY OF PRESENT ILLNESS: This is a pleasant 45 year old female who is known to Dr. Silverio Decamp from 2018.  She had a colonoscopy in February 2018 that showed erythematous mucosa in the rectum and diverticulosis in the colon.  Biopsy showed colonic epithelium without inflammation or any other pathologic findings.  Has a sister with severe ulcerative colitis status post total colectomy with a J-pouch and distant relatives with mild ulcerative colitis.  She has another sister with celiac disease.  She is here today to discuss her diagnosis of celiac disease and also to discuss change in bowel habits.  She had laboratory studies positive for celiac disease in September 2020.  She says that since then she has been following a gluten-free diet on and off.  She says that for the past month she decided that she needs to be serious about this and has been completely gluten-free.  She tells me that over the past couple of years she has become more and more constipated.  She tells me that he she used to be someone who had more of a diarrhea issue than anything.  She says that it is to the point that she is using magnesium citrate almost every day.  Looks like Dr. Silverio Decamp had given her Linzess 72 mcg daily when she saw her in 2018 but she says that it seemed to cause her dizziness so she stopped it.  She denies seeing any blood in her stools.  She is requesting another colonoscopy.  Recent CBC and CMP were unremarkable.  Hemoglobin 11.8 g.  She is still menstruating.  She mentions that her gynecologist recently checked some other labs as well including vitamin D levels, etc.  Remotely she had a lot of issues in college, etc. with nausea and vomiting.  She had one episode of nausea recently.  Past Medical History:  Diagnosis Date   ADD (attention deficit disorder)    Adenomyosis    Anemia    Asthma    Celiac disease    Diverticulitis    Diverticulosis     Heart murmur    Ovarian cyst    PCOS (polycystic ovarian syndrome)    Past Surgical History:  Procedure Laterality Date   NO PAST SURGERIES      reports that she has never smoked. She has never used smokeless tobacco. She reports current alcohol use. She reports that she does not use drugs. family history includes Cancer in her paternal aunt; Celiac disease in her sister; Colon polyps in her maternal aunt, maternal aunt, and mother; Diabetes in her maternal aunt; Heart attack in her maternal aunt and paternal grandfather; Hyperlipidemia in her mother; Hypertension in her brother; Irritable bowel syndrome in her sister; Ulcerative colitis in her sister. Allergies  Allergen Reactions   Iodine     REACTION: Itching in throat   Shellfish Allergy     REACTION: GI issues; itching   Other Nausea And Vomiting    Flax seed       Outpatient Encounter Medications as of 09/12/2020  Medication Sig   albuterol (PROVENTIL) (2.5 MG/3ML) 0.083% nebulizer solution TAKE 3 MLS BY NEBULIZATION EVERY 6 (SIX) HOURS AS NEEDED FOR WHEEZING OR SHORTNESS OF BREATH.   albuterol (VENTOLIN HFA) 108 (90 Base) MCG/ACT inhaler TAKE 2 PUFFS BY MOUTH EVERY 6 HOURS AS NEEDED FOR WHEEZE OR SHORTNESS OF BREATH   L-Lysine 1000 MG TABS Take 2 tablets by mouth daily.   montelukast (SINGULAIR)  10 MG tablet TAKE 1 TABLET BY MOUTH EVERYDAY AT BEDTIME   norgestrel-ethinyl estradiol (LOW-OGESTREL) 0.3-30 MG-MCG tablet Take 1 tablet by mouth daily.   Nutritional Supplements (NUTRITIONAL SUPPLEMENT PO) Smarty Pants Prenatals-1 tablet by mouth daily   Omega-3 1000 MG CAPS Take 1,000 mg by mouth daily.   QUERCETIN PO Take 1 tablet by mouth daily.   QVAR REDIHALER 40 MCG/ACT inhaler TAKE 2 PUFFS BY MOUTH TWICE A DAY   Turmeric 500 MG TABS Take by mouth. Take 2-3 tablets by mouth daily   VITAMIN D PO 25 mcg. Take 1/2 tablet daily   zinc gluconate 50 MG tablet Take 25 mg by mouth daily.   Amphet-Dextroamphet 3-Bead ER (MYDAYIS)  37.5 MG CP24 Take 37.5 mg by mouth daily. (Patient not taking: Reported on 09/12/2020)   EPINEPHrine 0.3 mg/0.3 mL IJ SOAJ injection INJECT AS DIRECTED (Patient not taking: Reported on 09/12/2020)   [DISCONTINUED] meloxicam (MOBIC) 15 MG tablet Take 1 tablet (15 mg total) by mouth daily.   No facility-administered encounter medications on file as of 09/12/2020.    REVIEW OF SYSTEMS  : All other systems reviewed and negative except where noted in the History of Present Illness.   PHYSICAL EXAM: BP 124/90 (BP Location: Left Arm, Patient Position: Sitting, Cuff Size: Normal)   Pulse 76   Ht 5' 8.75" (1.746 m)   Wt 254 lb 4 oz (115.3 kg)   LMP 08/12/2020   BMI 37.82 kg/m  General: Well developed AA female in no acute distress Head: Normocephalic and atraumatic Eyes:  Sclerae anicteric, conjunctiva pink. Ears: Normal auditory acuity Lungs: Clear throughout to auscultation; no W/R/R. Heart: Regular rate and rhythm; no M/R/G. Abdomen: Soft, non-distended.  BS present.  Non-tender. Rectal:  Will be done at the time of colonoscopy. Musculoskeletal: Symmetrical with no gross deformities  Skin: No lesions on visible extremities Extremities: No edema  Neurological: Alert oriented x 4, grossly non-focal Psychological:  Alert and cooperative. Normal mood and affect  ASSESSMENT AND PLAN: *Celiac disease: Diagnosed by positive serologies in September 2020.  Says that she was only doing gluten-free on and off until about the past month.  Has been very cautious for the past month.  We will plan for EGD with small bowel biopsies.  Will recheck celiac labs today to compare her to serologies to when she had them done previously. *Change in bowel habits with constipation: Says that previously she was one who had issues with diarrhea.  She has noticed more and more issues with constipation over the past couple of years.  He is using magnesium citrate regularly.  We discussed maybe trying MiraLAX twice daily  in order to try to avoid having to use the magnesium citrate.  Will plan for colonoscopy again at the patient request. *Family history of ulcerative colitis: Has 1 sister with severe ulcerative colitis status post total colectomy with a J-pouch and distant relatives with mild ulcerative colitis.  **The risks, benefits, and alternatives to EGD and colonoscopy were discussed with the patient and she consents to proceed.   **Procedures are being scheduled with Dr. Candis Schatz due to sooner availability.   CC:  [name], MD

## 2020-09-12 NOTE — Patient Instructions (Signed)
If you are age 43 or older, your body mass index should be between 23-30. Your Body mass index is 37.82 kg/m. If this is out of the aforementioned range listed, please consider follow up with your Primary Care Provider.  If you are age 61 or younger, your body mass index should be between 19-25. Your Body mass index is 37.82 kg/m. If this is out of the aformentioned range listed, please consider follow up with your Primary Care Provider.   You have been scheduled for an endoscopy and colonoscopy. Please follow the written instructions given to you at your visit today. Please pick up your prep supplies at the pharmacy within the next 1-3 days. If you use inhalers (even only as needed), please bring them with you on the day of your procedure.   Your provider has requested that you go to the basement level for lab work before leaving today. Press "B" on the elevator. The lab is located at the first door on the left as you exit the elevator.    The [facility] providers would like to encourage you to use [facility] to communicate with providers for non-urgent requests or questions.  Due to long hold times on the telephone, sending your provider a message by [facility] may be a faster and more efficient way to get a response.  Please allow 48 business hours for a response.  Please remember that this is for non-urgent requests.   Due to recent changes in healthcare laws, you may see the results of your imaging and laboratory studies on MyChart before your provider has had a chance to review them.  We understand that in some cases there may be results that are confusing or concerning to you. Not all laboratory results come back in the same time frame and the provider may be waiting for multiple results in order to interpret others.  Please give [place] 48 hours in order for your provider to thoroughly review all the results before contacting the office for clarification of your results.   Brittany Bogus, PA-C

## 2020-09-13 LAB — TISSUE TRANSGLUTAMINASE, IGA: (tTG) Ab, IgA: 1.7 U/mL

## 2020-09-13 LAB — IGA: Immunoglobulin A: 162 mg/dL (ref 47–310)

## 2020-09-27 ENCOUNTER — Encounter: Payer: Self-pay | Admitting: Family Medicine

## 2020-09-27 ENCOUNTER — Other Ambulatory Visit: Payer: Self-pay

## 2020-09-27 ENCOUNTER — Ambulatory Visit: Payer: BC Managed Care – PPO | Admitting: Family Medicine

## 2020-09-27 DIAGNOSIS — F988 Other specified behavioral and emotional disorders with onset usually occurring in childhood and adolescence: Secondary | ICD-10-CM

## 2020-09-27 NOTE — Assessment & Plan Note (Signed)
Poor control off medication.  Stress reduced, back on track with stress. Restart at 25 mg daily then increase to 37.5 mg daily if tolerated.  follow up 4 weeks after increase to 37.5 mg daily

## 2020-09-27 NOTE — Patient Instructions (Addendum)
Restart MyDayis at 25 mg daily for 1 week or so, then bump up dose to 37.5 mg daily.  Follow BP  goal < 140/90 and HR at home 60-90

## 2020-09-27 NOTE — Progress Notes (Signed)
Patient ID: Brittany [name], female    DOB: November 24, 1975, 45 y.o.   MRN: [id number]  This visit was conducted in person.  Pulse 83   Temp 97.7 F (36.5 C) (Temporal)   Ht 5' 9.5" (1.765 m)   LMP 09/13/2020   SpO2 95%   BMI 37.01 kg/m    CC: Chief Complaint  Patient presents with   Follow-up    BP & Heart rate    Subjective:   HPI: Brittany [name] is a 45 y.o. female presenting on 09/27/2020 for Follow-up (BP & Heart rate)  ADD:  At last   now back on track with health lifestyle and stress reduction.     Relevant past medical, surgical, family and social history reviewed and updated as indicated. Interim medical history since our last visit reviewed. Allergies and medications reviewed and updated. Outpatient Medications Prior to Visit  Medication Sig Dispense Refill   albuterol (PROVENTIL) (2.5 MG/3ML) 0.083% nebulizer solution TAKE 3 MLS BY NEBULIZATION EVERY 6 (SIX) HOURS AS NEEDED FOR WHEEZING OR SHORTNESS OF BREATH. 150 mL 1   albuterol (VENTOLIN HFA) 108 (90 Base) MCG/ACT inhaler TAKE 2 PUFFS BY MOUTH EVERY 6 HOURS AS NEEDED FOR WHEEZE OR SHORTNESS OF BREATH 18 each 2   Amphet-Dextroamphet 3-Bead ER (MYDAYIS) 37.5 MG CP24 Take 37.5 mg by mouth daily. (Patient not taking: Reported on 09/12/2020) 30 capsule 0   EPINEPHrine 0.3 mg/0.3 mL IJ SOAJ injection INJECT AS DIRECTED (Patient not taking: Reported on 09/12/2020) 2 each 0   L-Lysine 1000 MG TABS Take 2 tablets by mouth daily.     montelukast (SINGULAIR) 10 MG tablet TAKE 1 TABLET BY MOUTH EVERYDAY AT BEDTIME 90 tablet 3   norgestrel-ethinyl estradiol (LOW-OGESTREL) 0.3-30 MG-MCG tablet Take 1 tablet by mouth daily.     Nutritional Supplements (NUTRITIONAL SUPPLEMENT PO) Smarty Pants Prenatals-1 tablet by mouth daily     Omega-3 1000 MG CAPS Take 1,000 mg by mouth daily.     PEG-KCl-NaCl-NaSulf-Na Asc-C (PLENVU) 140 g SOLR Use as directed. Manufacturer's coupon Universal coupon code:BIN: P2366821; GROUP: [id number]; PCN: [id number];  ID: [id number]; PAY NO MORE $50; NO prior authorization 1 each 0   QUERCETIN PO Take 1 tablet by mouth daily.     QVAR REDIHALER 40 MCG/ACT inhaler TAKE 2 PUFFS BY MOUTH TWICE A DAY 10.6 g 11   Turmeric 500 MG TABS Take by mouth. Take 2-3 tablets by mouth daily     VITAMIN D PO 25 mcg. Take 1/2 tablet daily     zinc gluconate 50 MG tablet Take 25 mg by mouth daily.     No facility-administered medications prior to visit.     Per HPI unless specifically indicated in ROS section below Review of Systems  Constitutional:  Negative for fatigue and fever.  HENT:  Negative for ear pain.   Eyes:  Negative for pain.  Respiratory:  Negative for chest tightness and shortness of breath.   Cardiovascular:  Negative for chest pain, palpitations and leg swelling.  Gastrointestinal:  Negative for abdominal pain.  Genitourinary:  Negative for dysuria.  Objective:  Pulse 83   Temp 97.7 F (36.5 C) (Temporal)   Ht 5' 9.5" (1.765 m)   LMP 09/13/2020   SpO2 95%   BMI 37.01 kg/m   Wt Readings from Last 3 Encounters:  09/12/20 254 lb 4 oz (115.3 kg)  08/05/20 256 lb (116.1 kg)  05/10/20 256 lb 4 oz (116.2 kg)    BP Readings from  Last 3 Encounters:  09/12/20 116/90  08/05/20 126/78  05/10/20 120/80      Physical Exam Constitutional:      General: She is not in acute distress.    Appearance: Normal appearance. She is well-developed. She is obese. She is not ill-appearing or toxic-appearing.  HENT:     Head: Normocephalic.     Right Ear: Hearing, tympanic membrane, ear canal and external ear normal. Tympanic membrane is not erythematous, retracted or bulging.     Left Ear: Hearing, tympanic membrane, ear canal and external ear normal. Tympanic membrane is not erythematous, retracted or bulging.     Nose: No mucosal edema or rhinorrhea.     Right Sinus: No maxillary sinus tenderness or frontal sinus tenderness.     Left Sinus: No maxillary sinus tenderness or frontal sinus tenderness.      Mouth/Throat:     Pharynx: Uvula midline.  Eyes:     General: Lids are normal. Lids are everted, no foreign bodies appreciated.     Conjunctiva/sclera: Conjunctivae normal.     Pupils: Pupils are equal, round, and reactive to light.  Neck:     Thyroid: No thyroid mass or thyromegaly.     Vascular: No carotid bruit.     Trachea: Trachea normal.  Cardiovascular:     Rate and Rhythm: Normal rate and regular rhythm.     Pulses: Normal pulses.     Heart sounds: Normal heart sounds, S1 normal and S2 normal. No murmur heard.   No friction rub. No gallop.  Pulmonary:     Effort: Pulmonary effort is normal. No tachypnea or respiratory distress.     Breath sounds: Normal breath sounds. No decreased breath sounds, wheezing, rhonchi or rales.  Abdominal:     General: Bowel sounds are normal.     Palpations: Abdomen is soft.     Tenderness: There is no abdominal tenderness.  Musculoskeletal:     Cervical back: Normal range of motion and neck supple.  Skin:    General: Skin is warm and dry.     Findings: No rash.  Neurological:     Mental Status: She is alert.  Psychiatric:        Mood and Affect: Mood is not anxious or depressed.        Speech: Speech normal.        Behavior: Behavior normal. Behavior is cooperative.        Thought Content: Thought content normal.        Judgment: Judgment normal.      Results for orders placed or performed in visit on 09/12/20  IgA  Result Value Ref Range   Immunoglobulin A 162 47 - 310 mg/dL  Tissue transglutaminase, IgA  Result Value Ref Range   (tTG) Ab, IgA 1.7 U/mL    This visit occurred during the SARS-CoV-2 public health emergency.  Safety protocols were in place, including screening questions prior to the visit, additional usage of staff PPE, and extensive cleaning of exam room while observing appropriate contact time as indicated for disinfecting solutions.   COVID 19 screen:  No recent travel or known exposure to COVID19 The patient  denies respiratory symptoms of COVID 19 at this time. The importance of social distancing was discussed today.   Assessment and Plan Problem List Items Addressed This Visit     ADD (attention deficit disorder)    Poor control off medication.  Stress reduced, back on track with stress. Restart at 25 mg daily then  increase to 37.5 mg daily if tolerated.  follow up 4 weeks after increase to 37.5 mg daily            Eliezer Lofts, MD

## 2020-10-17 ENCOUNTER — Telehealth: Payer: Self-pay | Admitting: Gastroenterology

## 2020-10-17 NOTE — Telephone Encounter (Signed)
Hey Dr. Candis Schatz,   Patient cancelled Endo/Colon 10/20/20. 2 deaths in family and have to go out of town. Will call back to reschedule.  Thank you

## 2020-10-19 NOTE — Progress Notes (Signed)
Reviewed and agree with documentation and assessment and plan. K. Veena Dennice Tindol , MD   

## 2020-10-20 ENCOUNTER — Encounter: Payer: BC Managed Care – PPO | Admitting: Gastroenterology

## 2020-10-25 MED ORDER — ALBUTEROL SULFATE HFA 108 (90 BASE) MCG/ACT IN AERS: INHALATION_SPRAY | RESPIRATORY_TRACT | 2 refills | Status: DC

## 2020-11-21 ENCOUNTER — Other Ambulatory Visit: Payer: Self-pay

## 2020-11-21 MED ORDER — MYDAYIS 37.5 MG PO CP24: 37.5000 mg | ORAL_CAPSULE | Freq: Every day | ORAL | 0 refills | Status: DC

## 2020-11-21 NOTE — Telephone Encounter (Signed)
Last office visit 09/27/2020 for ADD.  Last refilled 08/05/2020 for #30 with no refills. No future appointments.

## 2020-12-20 ENCOUNTER — Other Ambulatory Visit: Payer: Self-pay

## 2020-12-20 MED ORDER — MYDAYIS 37.5 MG PO CP24: 37.5000 mg | ORAL_CAPSULE | Freq: Every day | ORAL | 0 refills | Status: DC

## 2020-12-20 NOTE — Telephone Encounter (Signed)
Last office visit 09/27/2020 for ADD.  Last refilled 11/21/2020 for #30 with no refills.  No future appointments.    Patient comment: This is working well, it's lasting longer and has more of an effect in the morning. Thanks!

## 2021-01-16 ENCOUNTER — Other Ambulatory Visit: Payer: Self-pay

## 2021-01-16 MED ORDER — MYDAYIS 37.5 MG PO CP24: 37.5000 mg | ORAL_CAPSULE | Freq: Every day | ORAL | 0 refills | Status: DC

## 2021-01-16 NOTE — Telephone Encounter (Signed)
Last office visit 09/27/2020 for ADD.  Last refilled 12/20/2020 for #30 with no refills.  No future appointments.

## 2021-01-23 ENCOUNTER — Telehealth: Payer: Self-pay | Admitting: Family Medicine

## 2021-01-23 ENCOUNTER — Other Ambulatory Visit: Payer: BC Managed Care – PPO

## 2021-01-23 NOTE — Telephone Encounter (Signed)
Pt called stating that she had an appt with Dr Silvio Pate on 12/12/2018 and would like the same test run that she had then . Pt states that she is eating gluten but  still having symptoms and pain. Please advise.

## 2021-01-24 NOTE — Telephone Encounter (Addendum)
Pt called in wanting an update on lab work that  she requested to be done because she wants to know her levels. Pt wants to be called

## 2021-01-27 ENCOUNTER — Ambulatory Visit: Payer: BC Managed Care – PPO | Admitting: Family Medicine

## 2021-01-27 ENCOUNTER — Other Ambulatory Visit: Payer: Self-pay

## 2021-01-27 ENCOUNTER — Encounter: Payer: Self-pay | Admitting: Family Medicine

## 2021-01-27 VITALS — BP 120/66 | HR 86 | Temp 97.6°F | Ht 69.5 in

## 2021-01-27 DIAGNOSIS — R1032 Left lower quadrant pain: Secondary | ICD-10-CM

## 2021-01-27 DIAGNOSIS — K900 Celiac disease: Secondary | ICD-10-CM | POA: Diagnosis not present

## 2021-01-27 DIAGNOSIS — K5900 Constipation, unspecified: Secondary | ICD-10-CM

## 2021-01-27 LAB — POC URINALSYSI DIPSTICK (AUTOMATED)
Bilirubin, UA: NEGATIVE
Glucose, UA: NEGATIVE
Ketones, UA: NEGATIVE
Nitrite, UA: NEGATIVE
Protein, UA: NEGATIVE
Spec Grav, UA: 1.01 (ref 1.010–1.025)
Urobilinogen, UA: 0.2 E.U./dL
pH, UA: 6 (ref 5.0–8.0)

## 2021-01-27 NOTE — Progress Notes (Signed)
Patient ID: RAINA [name], female    DOB: November 19, 1975, 45 y.o.   MRN: [id number]  This visit was conducted in person.  BP 120/66   Pulse 86   Temp 97.6 F (36.4 C) (Temporal)   Ht 5' 9.5" (1.765 m)   SpO2 100%   BMI 37.01 kg/m    CC: Chief Complaint  Patient presents with   Abdominal Pain    Requesting Celiac test   Nausea    Subjective:   HPI: Brittany [name] is a 45 y.o. female presenting on 01/27/2021 for Abdominal Pain (Requesting Celiac test) and Nausea  Celiac disease Dx in 2018 per GI. Brittany [name] She had a colonoscopy in February 2018 that showed erythematous mucosa in the rectum and diverticulosis in the colon.  Biopsy showed colonic epithelium without inflammation or any other pathologic findings. She had laboratory studies positive for celiac disease in September 2020.  Reviewed note in 09/12/20 Recommended repeat EGD with small bowel biopsies.  She has been working on aggressive gluten free diet since May 2022  Has appt next week with GYN Brittany [name]  [place] planned next week to eval  Pain is left lower abdominal pain off and on for 1-2 week.  BMs vary from constipation to  diarrhea.. No longer using Mg Citrate. Improved since Wednesday.  No blood in stool.  Smoothies help.  Hg  12 at GYN.    Family history of ulcerative colitis in sister, has colectomy.      Relevant past medical, surgical, family and social history reviewed and updated as indicated. Interim medical history since our last visit reviewed. Allergies and medications reviewed and updated. Outpatient Medications Prior to Visit  Medication Sig Dispense Refill   albuterol (PROVENTIL) (2.5 MG/3ML) 0.083% nebulizer solution TAKE 3 MLS BY NEBULIZATION EVERY 6 (SIX) HOURS AS NEEDED FOR WHEEZING OR SHORTNESS OF BREATH. 150 mL 1   albuterol (VENTOLIN HFA) 108 (90 Base) MCG/ACT inhaler TAKE 2 PUFFS BY MOUTH EVERY 6 HOURS AS NEEDED FOR WHEEZE OR SHORTNESS OF BREATH 18 each 2   Amphet-Dextroamphet  3-Bead ER (MYDAYIS) 37.5 MG CP24 Take 37.5 mg by mouth daily. 30 capsule 0   EPINEPHrine 0.3 mg/0.3 mL IJ SOAJ injection INJECT AS DIRECTED 2 each 0   L-Lysine 1000 MG TABS Take 2 tablets by mouth daily.     montelukast (SINGULAIR) 10 MG tablet TAKE 1 TABLET BY MOUTH EVERYDAY AT BEDTIME 90 tablet 3   norgestrel-ethinyl estradiol (LOW-OGESTREL) 0.3-30 MG-MCG tablet Take 1 tablet by mouth daily.     Nutritional Supplements (NUTRITIONAL SUPPLEMENT PO) Smarty Pants Prenatals-1 tablet by mouth daily     Omega-3 1000 MG CAPS Take 1,000 mg by mouth daily.     PEG-KCl-NaCl-NaSulf-Na Asc-C (PLENVU) 140 g SOLR Use as directed. Manufacturer's coupon Universal coupon code:BIN: P2366821; GROUP: [id number]; PCN: [id number]; ID: [id number]; PAY NO MORE $50; NO prior authorization 1 each 0   QUERCETIN PO Take 1 tablet by mouth daily.     QVAR REDIHALER 40 MCG/ACT inhaler TAKE 2 PUFFS BY MOUTH TWICE A DAY 10.6 g 11   Turmeric 500 MG TABS Take by mouth. Take 2-3 tablets by mouth daily     VITAMIN D PO 25 mcg. Take 1/2 tablet daily     zinc gluconate 50 MG tablet Take 25 mg by mouth daily.     No facility-administered medications prior to visit.     Per HPI unless specifically indicated in ROS section below Review of Systems  Constitutional:  Negative for fatigue and fever.  HENT:  Negative for ear pain.   Eyes:  Negative for pain.  Respiratory:  Negative for chest tightness and shortness of breath.   Cardiovascular:  Negative for chest pain, palpitations and leg swelling.  Gastrointestinal:  Negative for abdominal pain.  Genitourinary:  Negative for dysuria.  Objective:  BP 120/66   Pulse 86   Temp 97.6 F (36.4 C) (Temporal)   Ht 5' 9.5" (1.765 m)   SpO2 100%   BMI 37.01 kg/m   Wt Readings from Last 3 Encounters:  09/12/20 254 lb 4 oz (115.3 kg)  08/05/20 256 lb (116.1 kg)  05/10/20 256 lb 4 oz (116.2 kg)      Physical Exam Constitutional:      General: She is not in acute distress.     Appearance: Normal appearance. She is well-developed. She is not ill-appearing or toxic-appearing.  HENT:     Head: Normocephalic.     Right Ear: Hearing, tympanic membrane, ear canal and external ear normal. Tympanic membrane is not erythematous, retracted or bulging.     Left Ear: Hearing, tympanic membrane, ear canal and external ear normal. Tympanic membrane is not erythematous, retracted or bulging.     Nose: No mucosal edema or rhinorrhea.     Right Sinus: No maxillary sinus tenderness or frontal sinus tenderness.     Left Sinus: No maxillary sinus tenderness or frontal sinus tenderness.     Mouth/Throat:     Pharynx: Uvula midline.  Eyes:     General: Lids are normal. Lids are everted, no foreign bodies appreciated.     Conjunctiva/sclera: Conjunctivae normal.     Pupils: Pupils are equal, round, and reactive to light.  Neck:     Thyroid: No thyroid mass or thyromegaly.     Vascular: No carotid bruit.     Trachea: Trachea normal.  Cardiovascular:     Rate and Rhythm: Normal rate and regular rhythm.     Pulses: Normal pulses.     Heart sounds: Normal heart sounds, S1 normal and S2 normal. No murmur heard.   No friction rub. No gallop.  Pulmonary:     Effort: Pulmonary effort is normal. No tachypnea or respiratory distress.     Breath sounds: Normal breath sounds. No decreased breath sounds, wheezing, rhonchi or rales.  Abdominal:     General: Bowel sounds are normal.     Palpations: Abdomen is soft.     Tenderness: There is no abdominal tenderness.  Musculoskeletal:     Cervical back: Normal range of motion and neck supple.  Skin:    General: Skin is warm and dry.     Findings: No rash.  Neurological:     Mental Status: She is alert.  Psychiatric:        Mood and Affect: Mood is not anxious or depressed.        Speech: Speech normal.        Behavior: Behavior normal. Behavior is cooperative.        Thought Content: Thought content normal.        Judgment: Judgment  normal.      Results for orders placed or performed in visit on 09/12/20  IgA  Result Value Ref Range   Immunoglobulin A 162 47 - 310 mg/dL  Tissue transglutaminase, IgA  Result Value Ref Range   (tTG) Ab, IgA 1.7 U/mL    This visit occurred during the SARS-CoV-2 public health emergency.  Safety protocols were  in place, including screening questions prior to the visit, additional usage of staff PPE, and extensive cleaning of exam room while observing appropriate contact time as indicated for disinfecting solutions.   COVID 19 screen:  No recent travel or known exposure to COVID19 The patient denies respiratory symptoms of COVID 19 at this time. The importance of social distancing was discussed today.   Assessment and Plan Problem List Items Addressed This Visit     Celiac disease - Primary   Relevant Orders   Celiac Disease Panel (Completed)   Constipation   LLQ pain   Relevant Orders   Celiac Disease Panel (Completed)   POCT Urinalysis Dipstick (Automated) (Completed)   Re-eval for control of celiac disease per pt request. Will forward info to her Gi MD as I am not sure how to interpret results once diagnosis is already made.  Try  adding Benefiber to smoothies to help with regularity.  Can start magnesium 300 mg daily. Use Mirlax 17 g daily to twice daily for constipation.    Eliezer Lofts, MD

## 2021-01-27 NOTE — Patient Instructions (Addendum)
Please stop at the lab to have labs drawn.  Try  adding Benefiber to smoothies to help with regularity.  Can start magnesium 300 mg daily. Use Mirlax 17 g daily to twice daily for constipation.

## 2021-01-30 LAB — CELIAC DISEASE PANEL
(tTG) Ab, IgA: 1.5 U/mL
(tTG) Ab, IgG: <1 U/mL
Gliadin IgA: 26.5 U/mL — ABNORMAL HIGH
Gliadin IgG: 13.2 U/mL
Immunoglobulin A: 160 mg/dL (ref 47–310)

## 2021-02-13 ENCOUNTER — Other Ambulatory Visit: Payer: Self-pay | Admitting: Family Medicine

## 2021-02-13 NOTE — Telephone Encounter (Signed)
Last office visit 01/27/2021 for abdominal pain/nausea.  Last refilled 01/16/2021 for #30 with no refills.  No future appointments.

## 2021-02-14 MED ORDER — MYDAYIS 37.5 MG PO CP24: 37.5000 mg | ORAL_CAPSULE | Freq: Every day | ORAL | 0 refills | Status: DC

## 2021-02-15 ENCOUNTER — Ambulatory Visit: Payer: BC Managed Care – PPO | Admitting: Family Medicine

## 2021-02-15 ENCOUNTER — Other Ambulatory Visit: Payer: Self-pay | Admitting: Family Medicine

## 2021-02-15 ENCOUNTER — Encounter: Payer: Self-pay | Admitting: Family Medicine

## 2021-02-15 VITALS — BP 120/90 | HR 86 | Temp 98.2°F | Ht 69.5 in

## 2021-02-15 DIAGNOSIS — R0789 Other chest pain: Secondary | ICD-10-CM

## 2021-02-15 NOTE — Progress Notes (Signed)
[name], MD, [place] at [facility] [place], [address]  Phone: [phone]  FAX: [place] - 45 y.o. female  MRN [id number]  Date of Birth: 10-04-1975  Date: 02/15/2021  PCP: [name], MD  Referral: [name], MD  Chief Complaint  Patient presents with   Tender Area on Chest Wall    Right side of chest wall    This visit occurred during the SARS-CoV-2 public health emergency.  Safety protocols were in place, including screening questions prior to the visit, additional usage of staff PPE, and extensive cleaning of exam room while observing appropriate contact time as indicated for disinfecting solutions.   Subjective:   Brittany [name] is a 45 y.o. very pleasant female patient with Body mass index is 37.01 kg/m. who presents with the following:  Patient presents with a lump on the right side of her chest. Most recent mammogram was 3 weeks ago .  This was normal.  Tender on the R chest wall..  Was going to wait a week.  Had someone ask and had a couple of people recommend that she have this evaluated  There was some tenderness to palpation, but this is improved today compared to yesterday.  Review of Systems is noted in the HPI, as appropriate  Objective:   BP 120/90   Pulse 86   Temp 98.2 F (36.8 C) (Temporal)   Ht 5' 9.5" (1.765 m)   SpO2 99%   BMI 37.01 kg/m   GEN: No acute distress; alert,appropriate. PULM: Breathing comfortably in no respiratory distress PSYCH: Normally interactive.   Right-sided breast exam was normal.  She does have fibrocystic breast changes, but no concerning features or masses felt. No significant tenderness at the sternoclavicular joint.  No significant tenderness at the rib attachments to the sternum Patient does have some modest to mild tenderness approximately between ribs 2 and 3 medially.  I cannot elicit any focal pain with  compression of the ribs. This portion of the physical examination was chaperoned by [name], CMA.   Laboratory and Imaging Data:  Assessment and Plan:     ICD-10-CM   1. Right-sided chest wall pain  R07.89      Improving chest wall pain.  I do not appreciate any masses or changes at all in this region which effectively has no breast tissue.  Having had a mammogram about 3 weeks ago, this is very reassuring.  I would basically live is a month and let this heal.  Recommended taking some ibuprofen over the next few days.  Reassurance.   Follow-up: No follow-ups on file.  [employer] One speech-to-text software was used for transcription in this dictation.  Possible transcriptional errors can occur using Editor, commissioning.   Signed,  Maud Deed. Alucard Fearnow, MD   Outpatient Encounter Medications as of 02/15/2021  Medication Sig   albuterol (PROVENTIL) (2.5 MG/3ML) 0.083% nebulizer solution TAKE 3 MLS BY NEBULIZATION EVERY 6 (SIX) HOURS AS NEEDED FOR WHEEZING OR SHORTNESS OF BREATH.   albuterol (VENTOLIN HFA) 108 (90 Base) MCG/ACT inhaler TAKE 2 PUFFS BY MOUTH EVERY 6 HOURS AS NEEDED FOR WHEEZE OR SHORTNESS OF BREATH   Amphet-Dextroamphet 3-Bead ER (MYDAYIS) 37.5 MG CP24 Take 37.5 mg by mouth daily.   EPINEPHrine 0.3 mg/0.3 mL IJ SOAJ injection INJECT AS DIRECTED   L-Lysine 1000 MG TABS Take 2 tablets by mouth daily.   montelukast (SINGULAIR) 10  MG tablet TAKE 1 TABLET BY MOUTH EVERYDAY AT BEDTIME   norgestrel-ethinyl estradiol (LOW-OGESTREL) 0.3-30 MG-MCG tablet Take 1 tablet by mouth daily.   Nutritional Supplements (NUTRITIONAL SUPPLEMENT PO) Smarty Pants Prenatals-1 tablet by mouth daily   Omega-3 1000 MG CAPS Take 1,000 mg by mouth daily.   PEG-KCl-NaCl-NaSulf-Na Asc-C (PLENVU) 140 g SOLR Use as directed. Manufacturer's coupon Universal coupon code:BIN: [id number]; GROUP: [id number]; PCN: [id number]; ID: [id number]; PAY NO MORE $50; NO prior authorization   QUERCETIN PO Take 1 tablet  by mouth daily.   QVAR REDIHALER 40 MCG/ACT inhaler TAKE 2 PUFFS BY MOUTH TWICE A DAY   Turmeric 500 MG TABS Take by mouth. Take 2-3 tablets by mouth daily   VITAMIN D PO 25 mcg. Take 1/2 tablet daily   zinc gluconate 50 MG tablet Take 25 mg by mouth daily.   No facility-administered encounter medications on file as of 02/15/2021.

## 2021-03-09 ENCOUNTER — Other Ambulatory Visit: Payer: Self-pay | Admitting: Family Medicine

## 2021-03-30 ENCOUNTER — Other Ambulatory Visit: Payer: Self-pay | Admitting: Family Medicine

## 2021-03-30 MED ORDER — MYDAYIS 37.5 MG PO CP24
37.5000 mg | ORAL_CAPSULE | Freq: Every day | ORAL | 0 refills | Status: DC
Start: 2021-03-30 — End: 2021-05-15

## 2021-03-30 NOTE — Telephone Encounter (Signed)
Last office visit 02/15/2021 for chest wall pain.  Last refilled 02/14/2021 for #30 with no refills.  No future appointments.

## 2021-04-19 ENCOUNTER — Other Ambulatory Visit: Payer: Self-pay | Admitting: Family Medicine

## 2021-04-19 NOTE — Telephone Encounter (Signed)
QVAR not covered.  Pharmacy is asking for alternative:  Budesonide (PULMICORT FLEXHALER) 90 MCG/ACT inhaler fluticasone (FLOVENT HFA) 44 MCG/ACT inhaler

## 2021-04-27 IMAGING — DX DG KNEE COMPLETE 4+V*L*
5 series · 5 of 5 positions shown · non-contrast
Comparison: October 23, 2017.

CLINICAL DATA: Left knee pain after old injury.

EXAM:
LEFT KNEE - COMPLETE 4+ VIEW

[knee ap]
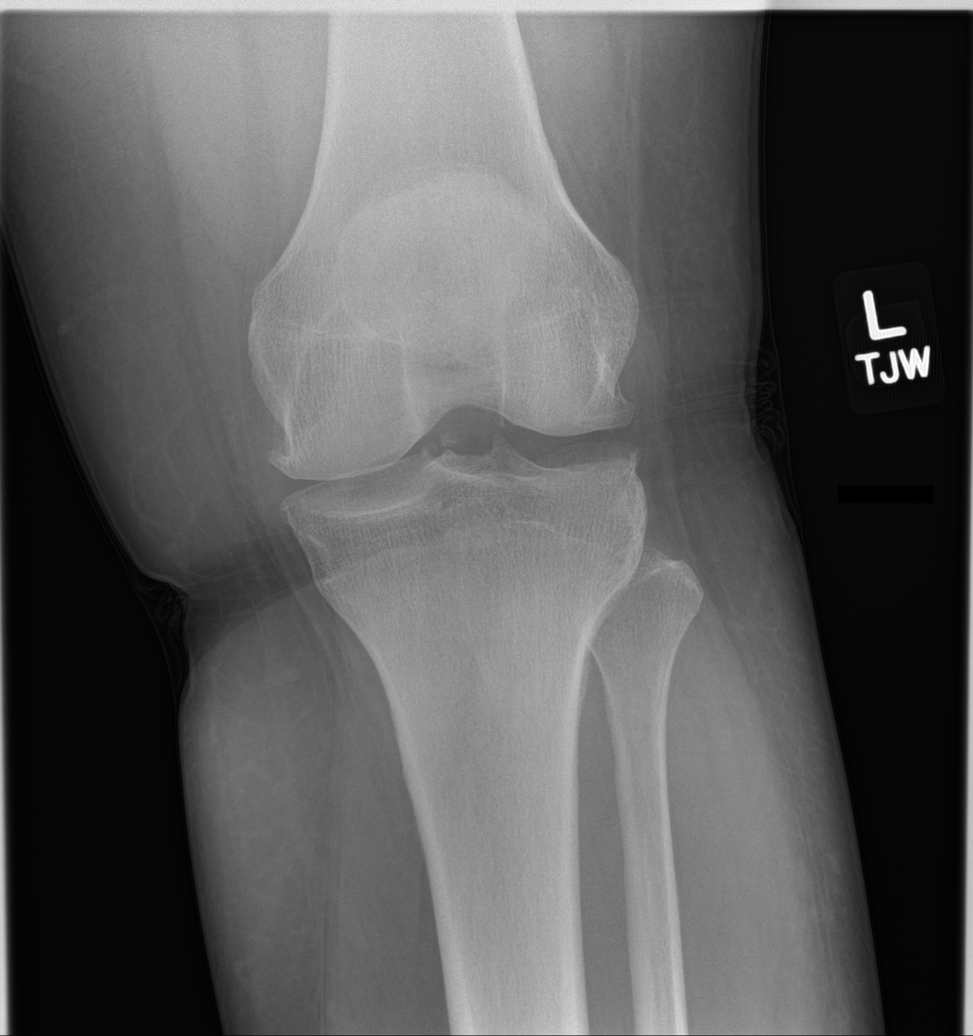

[knee lat]
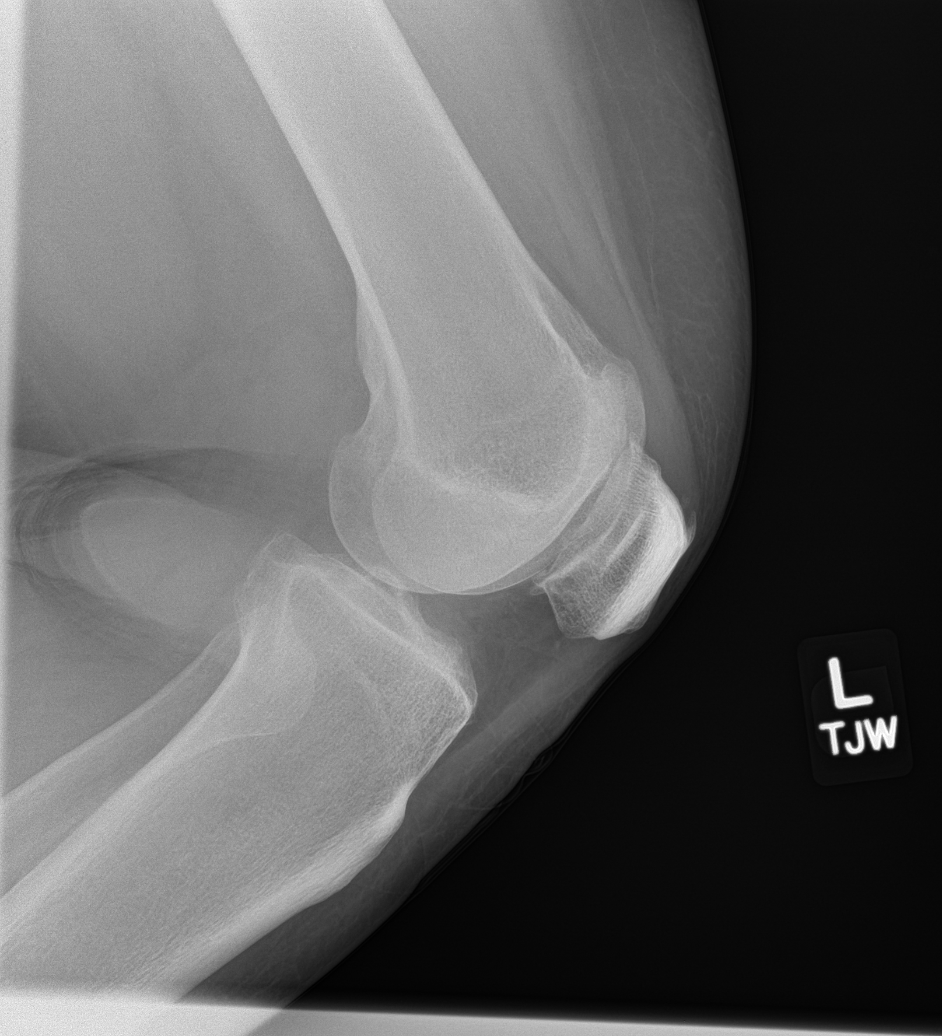

[patella skyline]
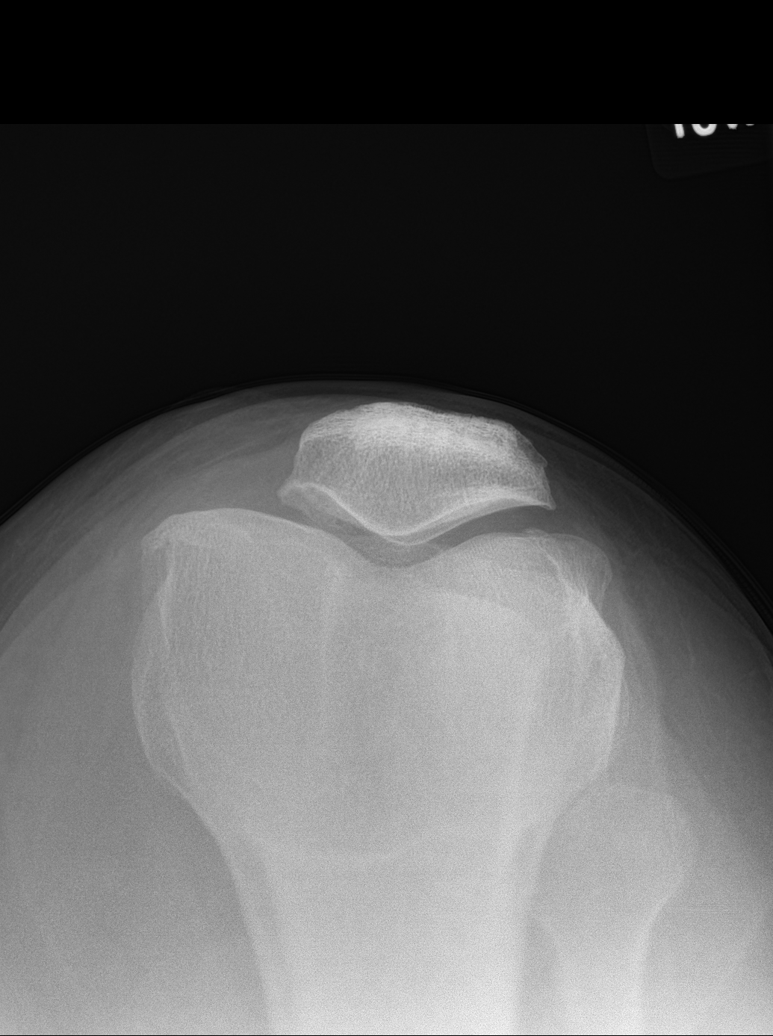

[knee obl (1 of 2)]
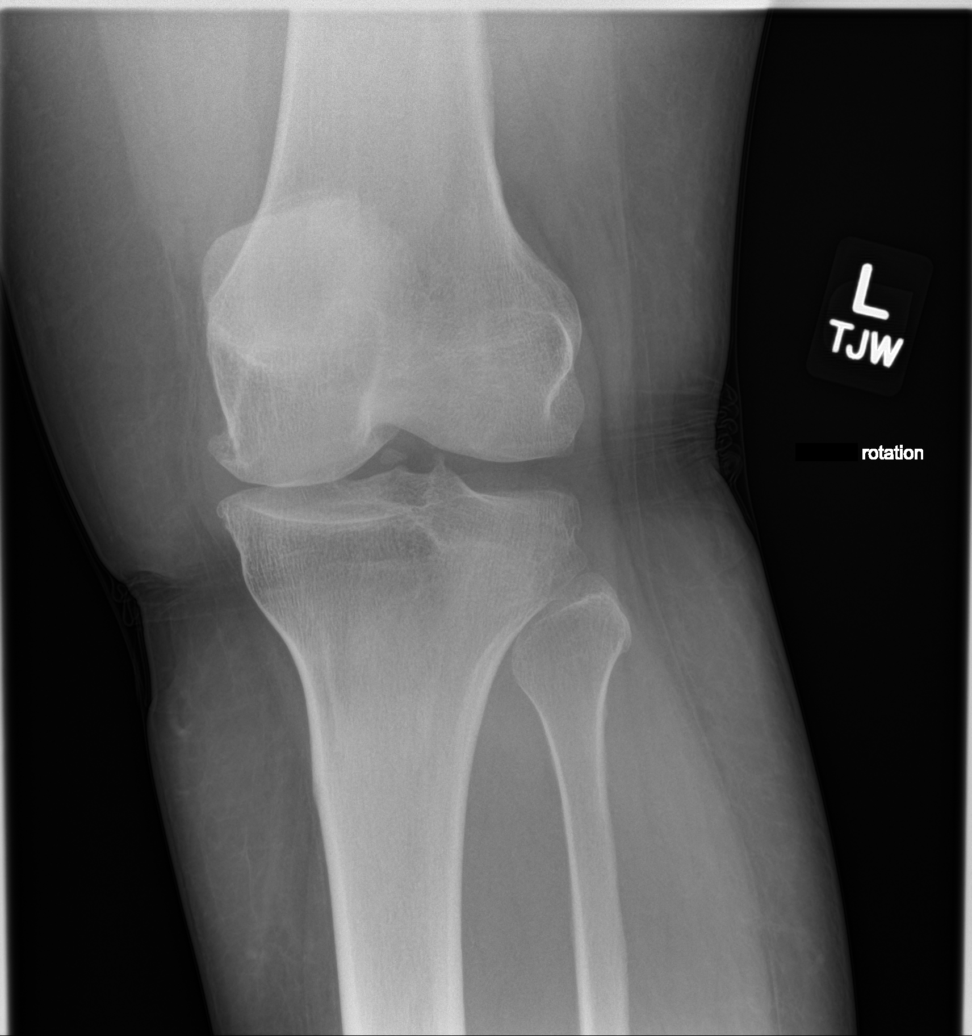

[knee obl (2 of 2)]
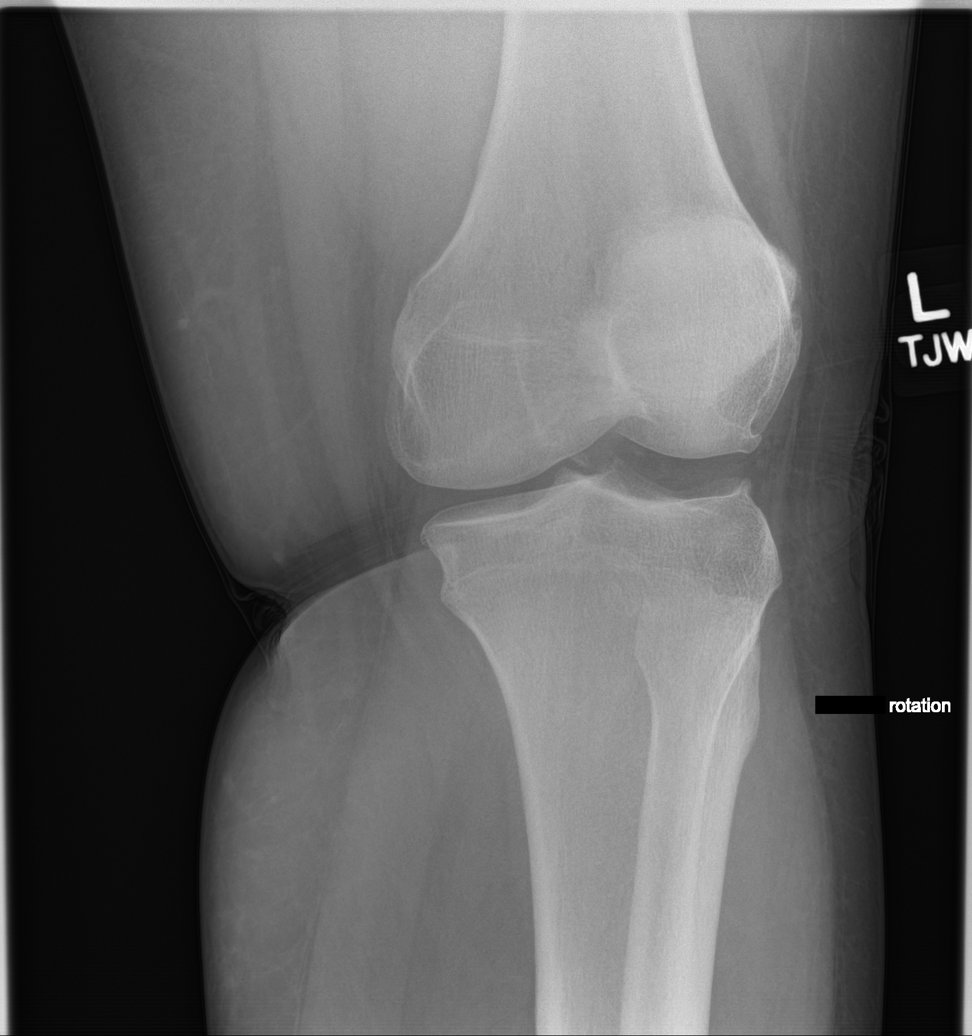

[5 of 5 positions shown; findings below may reference images not displayed]

FINDINGS: No evidence of fracture, dislocation, or joint effusion. Mild
osteophyte formation is noted medially and laterally, although no
significant joint space narrowing is noted. Soft tissues are
unremarkable.
IMPRESSION: Mild degenerative changes are noted. No acute abnormality is noted.

## 2021-05-15 ENCOUNTER — Other Ambulatory Visit: Payer: Self-pay | Admitting: Family Medicine

## 2021-05-15 MED ORDER — MYDAYIS 37.5 MG PO CP24
37.5000 mg | ORAL_CAPSULE | Freq: Every day | ORAL | 0 refills | Status: DC
Start: 2021-05-15 — End: 2021-06-09

## 2021-05-15 NOTE — Telephone Encounter (Signed)
Name of Medication: [employer] Name of Pharmacy: [facility] [address] Last Venida Jarvis or Written Date and Quantity: 03/30/21, #30 Last Office Visit and Type: 01/27/21, Celiac dz Next Office Visit and Type: 06/09/21, CPE Last Controlled Substance Agreement Date: 12/28/13 Last UDS: 12/28/13

## 2021-05-22 ENCOUNTER — Encounter: Payer: Self-pay | Admitting: Family Medicine

## 2021-05-23 NOTE — Telephone Encounter (Signed)
Called patient... discussed gluten monitor. As I am unfamiliar with his , she will discuss it with her GI MD at upcoming [place].

## 2021-05-31 ENCOUNTER — Telehealth: Payer: Self-pay | Admitting: Family Medicine

## 2021-05-31 DIAGNOSIS — E7800 Pure hypercholesterolemia, unspecified: Secondary | ICD-10-CM

## 2021-05-31 DIAGNOSIS — D509 Iron deficiency anemia, unspecified: Secondary | ICD-10-CM

## 2021-05-31 NOTE — Telephone Encounter (Signed)
-----   Message from Ellamae Sia sent at 05/24/2021  3:54 PM EST ----- ?Regarding: lab orders for Monday, 3.13.23 ?Patient is scheduled for CPX labs, please order future labs, Thanks , Terri ? ? ?

## 2021-06-05 ENCOUNTER — Other Ambulatory Visit: Payer: Self-pay

## 2021-06-05 ENCOUNTER — Other Ambulatory Visit (INDEPENDENT_AMBULATORY_CARE_PROVIDER_SITE_OTHER): Payer: BC Managed Care – PPO

## 2021-06-05 DIAGNOSIS — D509 Iron deficiency anemia, unspecified: Secondary | ICD-10-CM

## 2021-06-05 DIAGNOSIS — E7800 Pure hypercholesterolemia, unspecified: Secondary | ICD-10-CM

## 2021-06-06 LAB — CBC WITH DIFFERENTIAL/PLATELET
Basophils Absolute: 0.1 10*3/uL (ref 0.0–0.1)
Basophils Relative: 1.3 % (ref 0.0–3.0)
Eosinophils Absolute: 0 10*3/uL (ref 0.0–0.7)
Eosinophils Relative: 0.5 % (ref 0.0–5.0)
HCT: 36 % (ref 36.0–46.0)
Hemoglobin: 11.6 g/dL — ABNORMAL LOW (ref 12.0–15.0)
Lymphocytes Relative: 36.1 % (ref 12.0–46.0)
Lymphs Abs: 1.8 10*3/uL (ref 0.7–4.0)
MCHC: 32.3 g/dL (ref 30.0–36.0)
MCV: 86.9 fl (ref 78.0–100.0)
Monocytes Absolute: 0.3 10*3/uL (ref 0.1–1.0)
Monocytes Relative: 5.1 % (ref 3.0–12.0)
Neutro Abs: 2.9 10*3/uL (ref 1.4–7.7)
Neutrophils Relative %: 57 % (ref 43.0–77.0)
Platelets: 252 10*3/uL (ref 150.0–400.0)
RBC: 4.14 Mil/uL (ref 3.87–5.11)
RDW: 13.5 % (ref 11.5–15.5)
WBC: 5.1 10*3/uL (ref 4.0–10.5)

## 2021-06-06 LAB — COMPREHENSIVE METABOLIC PANEL
ALT: 10 U/L (ref 0–35)
AST: 16 U/L (ref 0–37)
Albumin: 4.3 g/dL (ref 3.5–5.2)
Alkaline Phosphatase: 40 U/L (ref 39–117)
BUN: 10 mg/dL (ref 6–23)
CO2: 28 mEq/L (ref 19–32)
Calcium: 9.2 mg/dL (ref 8.4–10.5)
Chloride: 100 mEq/L (ref 96–112)
Creatinine, Ser: 0.98 mg/dL (ref 0.40–1.20)
GFR: 70.53 mL/min (ref >60.00)
Glucose, Bld: 76 mg/dL (ref 70–99)
Potassium: 4 mEq/L (ref 3.5–5.1)
Sodium: 135 mEq/L (ref 135–145)
Total Bilirubin: 0.9 mg/dL (ref 0.2–1.2)
Total Protein: 6.7 g/dL (ref 6.0–8.3)

## 2021-06-06 LAB — LIPID PANEL
Cholesterol: 198 mg/dL (ref 0–200)
HDL: 65.9 mg/dL (ref >39.00)
LDL Cholesterol: 123 mg/dL — ABNORMAL HIGH (ref 0–99)
NonHDL: 132.26
Total CHOL/HDL Ratio: 3
Triglycerides: 44 mg/dL (ref 0.0–149.0)
VLDL: 8.8 mg/dL (ref 0.0–40.0)

## 2021-06-06 LAB — IBC + FERRITIN
Ferritin: 11 ng/mL (ref 10.0–291.0)
Iron: 114 ug/dL (ref 42–145)
Saturation Ratios: 33.6 % (ref 20.0–50.0)
TIBC: 338.8 ug/dL (ref 250.0–450.0)
Transferrin: 242 mg/dL (ref 212.0–360.0)

## 2021-06-06 NOTE — Progress Notes (Signed)
No critical labs need to be addressed urgently. We will discuss labs in detail at upcoming office visit.   

## 2021-06-09 ENCOUNTER — Ambulatory Visit (INDEPENDENT_AMBULATORY_CARE_PROVIDER_SITE_OTHER): Payer: BC Managed Care – PPO | Admitting: Family Medicine

## 2021-06-09 ENCOUNTER — Encounter: Payer: Self-pay | Admitting: Family Medicine

## 2021-06-09 ENCOUNTER — Other Ambulatory Visit: Payer: Self-pay

## 2021-06-09 VITALS — BP 114/80 | HR 59 | Ht 68.0 in | Wt 228.2 lb

## 2021-06-09 DIAGNOSIS — Z6834 Body mass index (BMI) 34.0-34.9, adult: Secondary | ICD-10-CM

## 2021-06-09 DIAGNOSIS — E6609 Other obesity due to excess calories: Secondary | ICD-10-CM | POA: Diagnosis not present

## 2021-06-09 DIAGNOSIS — Z23 Encounter for immunization: Secondary | ICD-10-CM

## 2021-06-09 DIAGNOSIS — Z0000 Encounter for general adult medical examination without abnormal findings: Secondary | ICD-10-CM | POA: Diagnosis not present

## 2021-06-09 DIAGNOSIS — E7800 Pure hypercholesterolemia, unspecified: Secondary | ICD-10-CM

## 2021-06-09 DIAGNOSIS — F988 Other specified behavioral and emotional disorders with onset usually occurring in childhood and adolescence: Secondary | ICD-10-CM | POA: Diagnosis not present

## 2021-06-09 MED ORDER — MYDAYIS 37.5 MG PO CP24: 37.5000 mg | ORAL_CAPSULE | Freq: Every day | ORAL | 0 refills | Status: DC

## 2021-06-09 NOTE — Patient Instructions (Addendum)
Keep working on healthy eating and regular exercise. ? Call if you need an adderall additional dose later in day for coverage of afternoon ?

## 2021-06-09 NOTE — Progress Notes (Signed)
? ? Patient ID: Brittany [name], female    DOB: 1975/06/08, 46 y.o.   MRN: [id number] ? ?This visit was conducted in person. ? ?BP 104/70   Pulse (!) 59   Ht 5' 8"  (1.727 m)   Wt 228 lb 3.2 oz (103.5 kg)   LMP 05/24/2021   BMI 34.70 kg/m?   ? ?CC:  ?Chief Complaint  ?Patient presents with  ? Annual Exam  ?  Physical exam , celiac  , discuss b/p issues   ? ? ?Subjective:  ? ?HPI: ?Brittany [name] is a 46 y.o. female presenting on 06/09/2021 for Annual Exam (Physical exam , celiac  , discuss b/p issues ) ? ?Elevated Cholesterol:  ?Lab Results  ?Component Value Date  ? CHOL 198 06/05/2021  ? HDL 65.90 06/05/2021  ? LDLCALC 123 (H) 06/05/2021  ? TRIG 44.0 06/05/2021  ? CHOLHDL 3 06/05/2021  ?The 10-year ASCVD risk score (Arnett DK, et al., 2019) is: 0.3% ?  Values used to calculate the score: ?    Age: 45 years ?    Sex: Female ?    Is Non-Hispanic African American: Yes ?    Diabetic: No ?    Tobacco smoker: No ?    Systolic Blood Pressure: 998 mmHg ?    Is BP treated: No ?    HDL Cholesterol: 65.9 mg/dL ?    Total Cholesterol: 198 mg/dL ?Using medications without problems: ?Muscle aches:  ?Diet compliance: healthy eating habits. ?Exercise: walking daily for 10 days. ?Other complaints: ? ?Obesity  ?Wt Readings from Last 3 Encounters:  ?06/09/21 228 lb 3.2 oz (103.5 kg)  ?09/12/20 254 lb 4 oz (115.3 kg)  ?08/05/20 256 lb (116.1 kg)  ? ?Body mass index is 34.7 kg/m?. ? ?BP Readings from Last 3 Encounters:  ?06/09/21 104/70  ?02/15/21 120/90  ?01/27/21 120/66  ? ?ADD, well controlled on MyDayis ?   ? ?Relevant past medical, surgical, family and social history reviewed and updated as indicated. Interim medical history since our last visit reviewed. ?Allergies and medications reviewed and updated. ?Outpatient Medications Prior to Visit  ?Medication Sig Dispense Refill  ? albuterol (PROVENTIL) (2.5 MG/3ML) 0.083% nebulizer solution TAKE 3 MLS BY NEBULIZATION EVERY 6 (SIX) HOURS AS NEEDED FOR WHEEZING OR SHORTNESS OF BREATH. 150  mL 1  ? albuterol (VENTOLIN HFA) 108 (90 Base) MCG/ACT inhaler TAKE 2 PUFFS BY MOUTH EVERY 6 HOURS AS NEEDED FOR WHEEZE OR SHORTNESS OF BREATH 18 each 2  ? Amphet-Dextroamphet 3-Bead ER (MYDAYIS) 37.5 MG CP24 Take 37.5 mg by mouth daily. 30 capsule 0  ? Budesonide (PULMICORT FLEXHALER) 90 MCG/ACT inhaler 1 inhalation twice daily 1 each 11  ? EPINEPHrine 0.3 mg/0.3 mL IJ SOAJ injection INJECT AS DIRECTED 2 each 0  ? L-Lysine 1000 MG TABS Take 2 tablets by mouth daily.    ? montelukast (SINGULAIR) 10 MG tablet TAKE 1 TABLET BY MOUTH EVERYDAY AT BEDTIME 90 tablet 3  ? norgestrel-ethinyl estradiol (LOW-OGESTREL) 0.3-30 MG-MCG tablet Take 1 tablet by mouth daily.    ? Nutritional Supplements (NUTRITIONAL SUPPLEMENT PO) Smarty Pants Prenatals-1 tablet by mouth daily    ? Omega-3 1000 MG CAPS Take 1,000 mg by mouth daily.    ? PEG-KCl-NaCl-NaSulf-Na Asc-C (PLENVU) 140 g SOLR Use as directed. Manufacturer's coupon Universal coupon code:BIN: P2366821; GROUP: [id number]; PCN: [id number]; ID: [id number]; PAY NO MORE $50; NO prior authorization 1 each 0  ? QUERCETIN PO Take 1 tablet by mouth daily.    ? Turmeric 500  MG TABS Take by mouth. Take 2-3 tablets by mouth daily    ? VITAMIN D PO 25 mcg. Take 1/2 tablet daily    ? zinc gluconate 50 MG tablet Take 25 mg by mouth daily.    ? ?No facility-administered medications prior to visit.  ?  ? ?Per HPI unless specifically indicated in ROS section below ?Review of Systems  ?Constitutional:  Negative for fatigue and fever.  ?HENT:  Negative for congestion.   ?Eyes:  Negative for pain.  ?Respiratory:  Negative for cough and shortness of breath.   ?Cardiovascular:  Negative for chest pain, palpitations and leg swelling.  ?Gastrointestinal:  Negative for abdominal pain.  ?Genitourinary:  Negative for dysuria and vaginal bleeding.  ?Musculoskeletal:  Negative for back pain.  ?Neurological:  Negative for syncope, light-headedness and headaches.  ?Psychiatric/Behavioral:  Negative for dysphoric  mood.   ?Objective:  ?BP 104/70   Pulse (!) 59   Ht 5' 8"  (1.727 m)   Wt 228 lb 3.2 oz (103.5 kg)   LMP 05/24/2021   BMI 34.70 kg/m?   ?Wt Readings from Last 3 Encounters:  ?06/09/21 228 lb 3.2 oz (103.5 kg)  ?09/12/20 254 lb 4 oz (115.3 kg)  ?08/05/20 256 lb (116.1 kg)  ?  ?  ?Physical Exam ?Vitals and nursing note reviewed.  ?Constitutional:   ?   General: She is not in acute distress. ?   Appearance: Normal appearance. She is well-developed. She is obese. She is not ill-appearing or toxic-appearing.  ?HENT:  ?   Head: Normocephalic.  ?   Right Ear: Hearing, tympanic membrane, ear canal and external ear normal.  ?   Left Ear: Hearing, tympanic membrane, ear canal and external ear normal.  ?   Nose: Nose normal.  ?Eyes:  ?   General: Lids are normal. Lids are everted, no foreign bodies appreciated.  ?   Conjunctiva/sclera: Conjunctivae normal.  ?   Pupils: Pupils are equal, round, and reactive to light.  ?Neck:  ?   Thyroid: No thyroid mass or thyromegaly.  ?   Vascular: No carotid bruit.  ?   Trachea: Trachea normal.  ?Cardiovascular:  ?   Rate and Rhythm: Normal rate and regular rhythm.  ?   Heart sounds: Normal heart sounds, S1 normal and S2 normal. No murmur heard. ?  No gallop.  ?Pulmonary:  ?   Effort: Pulmonary effort is normal. No respiratory distress.  ?   Breath sounds: Normal breath sounds. No wheezing, rhonchi or rales.  ?Abdominal:  ?   General: Bowel sounds are normal. There is no distension or abdominal bruit.  ?   Palpations: Abdomen is soft. There is no fluid wave or mass.  ?   Tenderness: There is no abdominal tenderness. There is no guarding or rebound.  ?   Hernia: No hernia is present.  ?Musculoskeletal:  ?   Cervical back: Normal range of motion and neck supple.  ?Lymphadenopathy:  ?   Cervical: No cervical adenopathy.  ?Skin: ?   General: Skin is warm and dry.  ?   Findings: No rash.  ?Neurological:  ?   Mental Status: She is alert.  ?   Cranial Nerves: No cranial nerve deficit.  ?    Sensory: No sensory deficit.  ?Psychiatric:     ?   Mood and Affect: Mood is not anxious or depressed.     ?   Speech: Speech normal.     ?   Behavior: Behavior normal. Behavior  is cooperative.     ?   Judgment: Judgment normal.  ? ?   ?Results for orders placed or performed in visit on 06/05/21  ?IBC + Ferritin  ?Result Value Ref Range  ? Iron 114 42 - 145 ug/dL  ? Transferrin 242.0 212.0 - 360.0 mg/dL  ? Saturation Ratios 33.6 20.0 - 50.0 %  ? Ferritin 11.0 10.0 - 291.0 ng/mL  ? TIBC 338.8 250.0 - 450.0 mcg/dL  ?CBC with Differential/Platelet  ?Result Value Ref Range  ? WBC 5.1 4.0 - 10.5 K/uL  ? RBC 4.14 3.87 - 5.11 Mil/uL  ? Hemoglobin 11.6 (L) 12.0 - 15.0 g/dL  ? HCT 36.0 36.0 - 46.0 %  ? MCV 86.9 78.0 - 100.0 fl  ? MCHC 32.3 30.0 - 36.0 g/dL  ? RDW 13.5 11.5 - 15.5 %  ? Platelets 252.0 150.0 - 400.0 K/uL  ? Neutrophils Relative % 57.0 43.0 - 77.0 %  ? Lymphocytes Relative 36.1 12.0 - 46.0 %  ? Monocytes Relative 5.1 3.0 - 12.0 %  ? Eosinophils Relative 0.5 0.0 - 5.0 %  ? Basophils Relative 1.3 0.0 - 3.0 %  ? Neutro Abs 2.9 1.4 - 7.7 K/uL  ? Lymphs Abs 1.8 0.7 - 4.0 K/uL  ? Monocytes Absolute 0.3 0.1 - 1.0 K/uL  ? Eosinophils Absolute 0.0 0.0 - 0.7 K/uL  ? Basophils Absolute 0.1 0.0 - 0.1 K/uL  ?Comprehensive metabolic panel  ?Result Value Ref Range  ? Sodium 135 135 - 145 mEq/L  ? Potassium 4.0 3.5 - 5.1 mEq/L  ? Chloride 100 96 - 112 mEq/L  ? CO2 28 19 - 32 mEq/L  ? Glucose, Bld 76 70 - 99 mg/dL  ? BUN 10 6 - 23 mg/dL  ? Creatinine, Ser 0.98 0.40 - 1.20 mg/dL  ? Total Bilirubin 0.9 0.2 - 1.2 mg/dL  ? Alkaline Phosphatase 40 39 - 117 U/L  ? AST 16 0 - 37 U/L  ? ALT 10 0 - 35 U/L  ? Total Protein 6.7 6.0 - 8.3 g/dL  ? Albumin 4.3 3.5 - 5.2 g/dL  ? GFR 70.53 >60.00 mL/min  ? Calcium 9.2 8.4 - 10.5 mg/dL  ?Lipid panel  ?Result Value Ref Range  ? Cholesterol 198 0 - 200 mg/dL  ? Triglycerides 44.0 0.0 - 149.0 mg/dL  ? HDL 65.90 >39.00 mg/dL  ? VLDL 8.8 0.0 - 40.0 mg/dL  ? LDL Cholesterol 123 (H) 0 - 99 mg/dL  ?  Total CHOL/HDL Ratio 3   ? NonHDL 132.26   ? ? ?This visit occurred during the SARS-CoV-2 public health emergency.  Safety protocols were in place, including screening questions prior to the visit, additional usage

## 2021-06-13 ENCOUNTER — Encounter: Payer: Self-pay | Admitting: Family Medicine

## 2021-06-18 ENCOUNTER — Other Ambulatory Visit: Payer: Self-pay | Admitting: Family Medicine

## 2021-06-19 NOTE — Telephone Encounter (Signed)
Last office visit  06/09/2021 for CPE.  Last refilled 06/09/2021 for #30 with no refills.  No future appointments with PCP.  ?

## 2021-06-23 ENCOUNTER — Ambulatory Visit: Payer: BC Managed Care – PPO | Admitting: Gastroenterology

## 2021-06-29 NOTE — Assessment & Plan Note (Signed)
Chronic, stable control. ? ?10-year ASCVD risk score is 0.3%.  She will continue to work on healthy lifestyle changes.  No medication indicated at this time. ?

## 2021-06-29 NOTE — Assessment & Plan Note (Signed)
Encouraged exercise, weight loss, healthy eating habits. ? ?

## 2021-06-29 NOTE — Assessment & Plan Note (Signed)
Chronic, stable control on current regimen ? ?Mydayis 37.5 mg daily ?

## 2021-07-17 ENCOUNTER — Other Ambulatory Visit: Payer: Self-pay | Admitting: Family Medicine

## 2021-07-18 MED ORDER — MYDAYIS 37.5 MG PO CP24: 37.5000 mg | ORAL_CAPSULE | Freq: Every day | ORAL | 0 refills | Status: DC

## 2021-07-18 NOTE — Telephone Encounter (Signed)
Refill request for Amphet-Dextroamphet 3-Bead ER (MYDAYIS) 37.5 MG CP24 ? ?LOV - 06/09/21 ?Next OV - not scheduled ?Last refill - 06/09/21 #30/0 ? ?

## 2021-08-08 ENCOUNTER — Ambulatory Visit: Payer: BC Managed Care – PPO | Admitting: Gastroenterology

## 2021-08-08 ENCOUNTER — Other Ambulatory Visit (INDEPENDENT_AMBULATORY_CARE_PROVIDER_SITE_OTHER): Payer: BC Managed Care – PPO

## 2021-08-08 ENCOUNTER — Encounter: Payer: Self-pay | Admitting: Gastroenterology

## 2021-08-08 VITALS — BP 118/78 | HR 77 | Ht 69.0 in | Wt 234.8 lb

## 2021-08-08 DIAGNOSIS — R109 Unspecified abdominal pain: Secondary | ICD-10-CM | POA: Diagnosis not present

## 2021-08-08 DIAGNOSIS — R140 Abdominal distension (gaseous): Secondary | ICD-10-CM

## 2021-08-08 DIAGNOSIS — R197 Diarrhea, unspecified: Secondary | ICD-10-CM

## 2021-08-08 DIAGNOSIS — Z8379 Family history of other diseases of the digestive system: Secondary | ICD-10-CM

## 2021-08-08 DIAGNOSIS — K5900 Constipation, unspecified: Secondary | ICD-10-CM

## 2021-08-08 DIAGNOSIS — K900 Celiac disease: Secondary | ICD-10-CM | POA: Diagnosis not present

## 2021-08-08 LAB — IBC + FERRITIN
Ferritin: 8.9 ng/mL — ABNORMAL LOW (ref 10.0–291.0)
Iron: 42 ug/dL (ref 42–145)
Saturation Ratios: 10.8 % — ABNORMAL LOW (ref 20.0–50.0)
TIBC: 390.6 ug/dL (ref 250.0–450.0)
Transferrin: 279 mg/dL (ref 212.0–360.0)

## 2021-08-08 LAB — CBC WITH DIFFERENTIAL/PLATELET
Basophils Absolute: 0.1 10*3/uL (ref 0.0–0.1)
Basophils Relative: 2.7 % (ref 0.0–3.0)
Eosinophils Absolute: 0 10*3/uL (ref 0.0–0.7)
Eosinophils Relative: 0.5 % (ref 0.0–5.0)
HCT: 37 % (ref 36.0–46.0)
Hemoglobin: 11.9 g/dL — ABNORMAL LOW (ref 12.0–15.0)
Lymphocytes Relative: 37.1 % (ref 12.0–46.0)
Lymphs Abs: 1.8 10*3/uL (ref 0.7–4.0)
MCHC: 32.1 g/dL (ref 30.0–36.0)
MCV: 85.6 fl (ref 78.0–100.0)
Monocytes Absolute: 0.3 10*3/uL (ref 0.1–1.0)
Monocytes Relative: 7.2 % (ref 3.0–12.0)
Neutro Abs: 2.5 10*3/uL (ref 1.4–7.7)
Neutrophils Relative %: 52.5 % (ref 43.0–77.0)
Platelets: 294 10*3/uL (ref 150.0–400.0)
RBC: 4.32 Mil/uL (ref 3.87–5.11)
RDW: 13 % (ref 11.5–15.5)
WBC: 4.7 10*3/uL (ref 4.0–10.5)

## 2021-08-08 LAB — SEDIMENTATION RATE: Sed Rate: 24 mm/hr — ABNORMAL HIGH (ref 0–20)

## 2021-08-08 LAB — COMPREHENSIVE METABOLIC PANEL
ALT: 12 U/L (ref 0–35)
AST: 18 U/L (ref 0–37)
Albumin: 4.4 g/dL (ref 3.5–5.2)
Alkaline Phosphatase: 42 U/L (ref 39–117)
BUN: 9 mg/dL (ref 6–23)
CO2: 28 mEq/L (ref 19–32)
Calcium: 9.4 mg/dL (ref 8.4–10.5)
Chloride: 102 mEq/L (ref 96–112)
Creatinine, Ser: 1.03 mg/dL (ref 0.40–1.20)
GFR: 66.36 mL/min (ref >60.00)
Glucose, Bld: 77 mg/dL (ref 70–99)
Potassium: 4.6 mEq/L (ref 3.5–5.1)
Sodium: 136 mEq/L (ref 135–145)
Total Bilirubin: 0.8 mg/dL (ref 0.2–1.2)
Total Protein: 7.4 g/dL (ref 6.0–8.3)

## 2021-08-08 LAB — B12 AND FOLATE PANEL
Folate: >23.5 ng/mL (ref >5.9)
Vitamin B-12: 313 pg/mL (ref 211–911)

## 2021-08-08 LAB — HIGH SENSITIVITY CRP: CRP, High Sensitivity: 2.65 mg/L (ref 0.000–5.000)

## 2021-08-08 MED ORDER — PLENVU 140 G PO SOLR: ORAL | Status: DC

## 2021-08-08 NOTE — Patient Instructions (Addendum)
Your provider has requested that you go to the basement level for lab work before leaving today. Press "B" on the elevator. The lab is located at the first door on the left as you exit the elevator.  ? ?You have been scheduled for an endoscopy and colonoscopy. Please follow the written instructions given to you at your visit today. ?Please pick up your prep supplies at the pharmacy within the next 1-3 days. ?If you use inhalers (even only as needed), please bring them with you on the day of your procedure.  ? ?Continue Gluten Free diet ? ?If you are age 23 or older, your body mass index should be between 23-30. Your Body mass index is 34.67 kg/m?Marland Kitchen If this is out of the aforementioned range listed, please consider follow up with your Primary Care Provider. ? ?If you are age 39 or younger, your body mass index should be between 19-25. Your Body mass index is 34.67 kg/m?Marland Kitchen If this is out of the aformentioned range listed, please consider follow up with your Primary Care Provider.  ? ?________________________________________________________ ? ?The [facility] providers would like to encourage you to use [facility] to communicate with providers for non-urgent requests or questions.  Due to long hold times on the telephone, sending your provider a message by [facility] may be a faster and more efficient way to get a response.  Please allow 48 business hours for a response.  Please remember that this is for non-urgent requests.  ?_______________________________________________________  ? ?Due to recent changes in healthcare laws, you may see the results of your imaging and laboratory studies on MyChart before your provider has had a chance to review them.  We understand that in some cases there may be results that are confusing or concerning to you. Not all laboratory results come back in the same time frame and the provider may be waiting for multiple results in order to interpret others.  Please give [place] 48 hours in order for  your provider to thoroughly review all the results before contacting the office for clarification of your results.   ? ?I appreciate the  opportunity to care for you ? ?Thank You  ? ?Harl Bowie , MD  ? ? ?

## 2021-08-08 NOTE — Progress Notes (Signed)
Brittany [name]    [id number]    03/04/1976  Primary Care Physician:Bedsole, Mervyn Gay, MD  Referring Physician: [name], MD [address] [place],  [place] [address]   Chief complaint:  diarrhea, pain  HPI:  46 year old very pleasant female is here for follow-up visit for abdominal pain and change in bowel habits.  She was seen by Brittany [name] in June 2022. She was scheduled for EGD and colonoscopy at that time for evaluation of her symptoms but she could not undergo the procedures. One of her sisters has severe ulcerative colitis, is s/p colectomy with a J-pouch and her other sister has celiac disease She had positive antibody for celiac.  She is avoiding gluten She has abdominal pain and bloating and also intermittent diarrhea, her symptoms have improved to some extent with dietary changes and avoiding gluten. Denies any unintentional weight loss or decreased appetite   Outpatient Encounter Medications as of 08/08/2021  Medication Sig   albuterol (PROVENTIL) (2.5 MG/3ML) 0.083% nebulizer solution TAKE 3 MLS BY NEBULIZATION EVERY 6 (SIX) HOURS AS NEEDED FOR WHEEZING OR SHORTNESS OF BREATH.   albuterol (VENTOLIN HFA) 108 (90 Base) MCG/ACT inhaler TAKE 2 PUFFS BY MOUTH EVERY 6 HOURS AS NEEDED FOR WHEEZE OR SHORTNESS OF BREATH   Amphet-Dextroamphet 3-Bead ER (MYDAYIS) 37.5 MG CP24 Take 37.5 mg by mouth daily. FILL  on 08/09/2021   Budesonide (PULMICORT FLEXHALER) 90 MCG/ACT inhaler 1 inhalation twice daily   EPINEPHrine 0.3 mg/0.3 mL IJ SOAJ injection INJECT AS DIRECTED   L-Lysine 1000 MG TABS Take 2 tablets by mouth daily.   montelukast (SINGULAIR) 10 MG tablet TAKE 1 TABLET BY MOUTH EVERYDAY AT BEDTIME   norgestrel-ethinyl estradiol (LOW-OGESTREL) 0.3-30 MG-MCG tablet Take 1 tablet by mouth daily.   Nutritional Supplements (NUTRITIONAL SUPPLEMENT PO) Smarty Pants Prenatals-1 tablet by mouth daily   Omega-3 1000 MG CAPS Take 1,000 mg by mouth daily.   QUERCETIN PO Take  1 tablet by mouth daily.   Turmeric 500 MG TABS Take by mouth. Take 2-3 tablets by mouth daily   VITAMIN D PO 25 mcg. Take 1/2 tablet daily   zinc gluconate 50 MG tablet Take 25 mg by mouth daily.   [DISCONTINUED] PEG-KCl-NaCl-NaSulf-Na Asc-C (PLENVU) 140 g SOLR Use as directed. Manufacturer's coupon Universal coupon code:BIN: P2366821; GROUP: [id number]; PCN: [id number]; ID: [id number]; PAY NO MORE $50; NO prior authorization   No facility-administered encounter medications on file as of 08/08/2021.    Allergies as of 08/08/2021 - Review Complete 08/08/2021  Allergen Reaction Noted   Gluten meal Nausea Only and Other (See Comments) 06/09/2021   Iodine  06/01/2010   Shellfish allergy  06/01/2010   Other Nausea And Vomiting 10/12/2015    Past Medical History:  Diagnosis Date   ADD (attention deficit disorder)    Adenomyosis    Anemia    Asthma    Celiac disease    Diverticulitis    Diverticulosis    Heart murmur    Ovarian cyst    PCOS (polycystic ovarian syndrome)     Past Surgical History:  Procedure Laterality Date   NO PAST SURGERIES      Family History  Problem Relation Age of Onset   Hyperlipidemia Mother    Colon polyps Mother    Ulcerative colitis Sister    Irritable bowel syndrome Sister    Celiac disease Sister    Hypertension Brother  constrolled with diet   Heart attack Paternal Grandfather    Diabetes Maternal Aunt    Colon polyps Maternal Aunt    Heart attack Maternal Aunt    Colon polyps Maternal Aunt    Cancer Paternal Aunt        ? type    Social History   Socioeconomic History   Marital status: Divorced    Spouse name: Not on file   Number of children: 2   Years of education: Not on file   Highest education level: Not on file  Occupational History   Occupation: Pharmacist, hospital  Tobacco Use   Smoking status: Never   Smokeless tobacco: Never  Vaping Use   Vaping Use: Never used  Substance and Sexual Activity   Alcohol use: Yes     Alcohol/week: 0.0 standard drinks    Comment: 1 drink every 2 months    Drug use: No   Sexual activity: Yes  Other Topics Concern   Not on file  Social History Narrative   Not on file   Social Determinants of Health   Financial Resource Strain: Not on file  Food Insecurity: Not on file  Transportation Needs: Not on file  Physical Activity: Not on file  Stress: Not on file  Social Connections: Not on file  Intimate Partner Violence: Not on file      Review of systems: All other review of systems negative except as mentioned in the HPI.   Physical Exam: Vitals:   08/08/21 1359  BP: 118/78  Pulse: 77   Body mass index is 34.67 kg/m. Gen:      No acute distress HEENT:  sclera anicteric Abd:      soft, non-tender; no palpable masses, no distension Ext:    No edema Neuro: alert and oriented x 3 Psych: normal mood and affect  Data Reviewed:  Reviewed labs, radiology imaging, old records and pertinent past GI work up   Assessment and Plan/Recommendations:  46 year old very pleasant female with family history of celiac disease and severe ulcerative colitis with complaints of abdominal discomfort, cramping, abdominal bloating and intermittent diarrhea  Antigliadin antibody is slightly positive, she is adhering to strictly gluten-free diet Celiac health maintenance check ESR, CRP, CMP, CBC, B12, folate, iron panel and TTG IgA antibody Schedule for EGD to obtain duodenal biopsies  Intermittent diarrhea, occasional bright red blood per rectum likely secondary to hemorrhoids.  She has mild elevation in inflammatory markers.  Family history is positive for ulcerative colitis.  We will plan to proceed with colonoscopy with biopsies to exclude IBD  The risks and benefits as well as alternatives of endoscopic procedure(s) have been discussed and reviewed. All questions answered. The patient agrees to proceed.   The patient was provided an opportunity to ask questions and all  were answered. The patient agreed with the plan and demonstrated an understanding of the instructions.  Damaris Hippo , MD    CC: [name], MD

## 2021-08-10 LAB — TISSUE TRANSGLUTAMINASE, IGA: (tTG) Ab, IgA: 1.6 U/mL

## 2021-08-15 ENCOUNTER — Encounter: Payer: Self-pay | Admitting: Gastroenterology

## 2021-08-23 ENCOUNTER — Other Ambulatory Visit: Payer: Self-pay | Admitting: Family Medicine

## 2021-08-23 ENCOUNTER — Encounter: Payer: Self-pay | Admitting: *Deleted

## 2021-08-23 NOTE — Telephone Encounter (Signed)
Last office visit 06/09/2021 for CPE.  Last refilled 07/18/2021 for #30 with no refills.  No future appointments with PCP.

## 2021-08-25 MED ORDER — MYDAYIS 37.5 MG PO CP24: 37.5000 mg | ORAL_CAPSULE | Freq: Every day | ORAL | 0 refills | Status: DC

## 2021-10-03 ENCOUNTER — Encounter: Payer: Self-pay | Admitting: Gastroenterology

## 2021-10-05 ENCOUNTER — Other Ambulatory Visit: Payer: Self-pay | Admitting: Family Medicine

## 2021-10-05 MED ORDER — MYDAYIS 37.5 MG PO CP24: 37.5000 mg | ORAL_CAPSULE | Freq: Every day | ORAL | 0 refills | Status: DC

## 2021-10-05 NOTE — Telephone Encounter (Signed)
Last office visit 06/09/21 for CPE.  Last refilled 08/25/21 for #30 with no refills.  No future appointments.

## 2021-10-08 ENCOUNTER — Other Ambulatory Visit: Payer: Self-pay | Admitting: Gastroenterology

## 2021-10-09 ENCOUNTER — Other Ambulatory Visit: Payer: Self-pay | Admitting: Gastroenterology

## 2021-10-09 ENCOUNTER — Telehealth: Payer: Self-pay | Admitting: Gastroenterology

## 2021-10-09 NOTE — Telephone Encounter (Signed)
PT called with several questions regarding her prep and medications. Answered accordingly and patient verbalized understanding.

## 2021-10-09 NOTE — Telephone Encounter (Signed)
Inbound call from patient stating that she is scheduled to have both endoscopy and colonoscopy procedures tomorrow at 2:00 with Dr. Silverio Decamp. Patient is requesting a call back to discuss when she can take her birth control, and also stated she is backed up and is wanting to know If there is anything she can take to help that and if she can move her prep time up and if she can use the old prep prescription that she got last, stated the new one is not at her pharmacy. Please advise.

## 2021-10-09 NOTE — Telephone Encounter (Signed)
Spoke with patient and she already has a prep kit that she had from last time she was scheduled that is still in date. She was told it is ok to use that and she thinks it is the Plenvu prep. She will contact the office if she has any problems witht he prep. Colon is tomorrow

## 2021-10-10 ENCOUNTER — Encounter: Payer: Self-pay | Admitting: Gastroenterology

## 2021-10-10 ENCOUNTER — Ambulatory Visit (AMBULATORY_SURGERY_CENTER): Payer: BC Managed Care – PPO | Admitting: Gastroenterology

## 2021-10-10 VITALS — BP 116/56 | HR 89 | Temp 98.4°F | Resp 18 | Ht 69.0 in | Wt 234.0 lb

## 2021-10-10 DIAGNOSIS — K2980 Duodenitis without bleeding: Secondary | ICD-10-CM | POA: Diagnosis not present

## 2021-10-10 DIAGNOSIS — K648 Other hemorrhoids: Secondary | ICD-10-CM

## 2021-10-10 DIAGNOSIS — R109 Unspecified abdominal pain: Secondary | ICD-10-CM | POA: Diagnosis not present

## 2021-10-10 DIAGNOSIS — K900 Celiac disease: Secondary | ICD-10-CM

## 2021-10-10 DIAGNOSIS — K31A19 Gastric intestinal metaplasia without dysplasia, unspecified site: Secondary | ICD-10-CM

## 2021-10-10 DIAGNOSIS — D123 Benign neoplasm of transverse colon: Secondary | ICD-10-CM

## 2021-10-10 DIAGNOSIS — K5900 Constipation, unspecified: Secondary | ICD-10-CM

## 2021-10-10 MED ORDER — SODIUM CHLORIDE 0.9 % IV SOLN: 500.0000 mL | Freq: Once | INTRAVENOUS | Status: DC

## 2021-10-10 NOTE — Patient Instructions (Signed)
Colonoscopy:  Handouts on polyps & hemorrhoids given to you today  Await pathology results on polyp removed   Repeat Colonoscopy in 7 - 10 yrs   Upper Endoscopy:  Await pathology results of duodenal biopsies  Make follow up appointment with Dr Silverio Decamp for 6 months    YOU HAD AN ENDOSCOPIC PROCEDURE TODAY AT [facility]:   Refer to the procedure report that was given to you for any specific questions about what was found during the examination.  If the procedure report does not answer your questions, please call your gastroenterologist to clarify.  If you requested that your care partner not be given the details of your procedure findings, then the procedure report has been included in a sealed envelope for you to review at your convenience later.  YOU SHOULD EXPECT: Some feelings of bloating in the abdomen. Passage of more gas than usual.  Walking can help get rid of the air that was put into your GI tract during the procedure and reduce the bloating. If you had a lower endoscopy (such as a colonoscopy or flexible sigmoidoscopy) you may notice spotting of blood in your stool or on the toilet paper. If you underwent a bowel prep for your procedure, you may not have a normal bowel movement for a few days.  Please Note:  You might notice some irritation and congestion in your nose or some drainage.  This is from the oxygen used during your procedure.  There is no need for concern and it should clear up in a day or so.  SYMPTOMS TO REPORT IMMEDIATELY:  Following lower endoscopy (colonoscopy or flexible sigmoidoscopy):  Excessive amounts of blood in the stool  Significant tenderness or worsening of abdominal pains  Swelling of the abdomen that is new, acute  Fever of 100F or higher  Following upper endoscopy (EGD)  Vomiting of blood or coffee ground material  New chest pain or pain under the shoulder blades  Painful or persistently difficult swallowing  New  shortness of breath  Fever of 100F or higher  Black, tarry-looking stools  For urgent or emergent issues, a gastroenterologist can be reached at any hour by calling [phone]. Do not use MyChart messaging for urgent concerns.    DIET:  We do recommend a small meal at first, but then you may proceed to your regular diet.  Drink plenty of fluids but you should avoid alcoholic beverages for 24 hours.  ACTIVITY:  You should plan to take it easy for the rest of today and you should NOT DRIVE or use heavy machinery until tomorrow (because of the sedation medicines used during the test).    FOLLOW UP: Our staff will call the number listed on your records the next business day following your procedure.  We will call around 7:15- 8:00 am to check on you and address any questions or concerns that you may have regarding the information given to you following your procedure. If we do not reach you, we will leave a message.  If you develop any symptoms (ie: fever, flu-like symptoms, shortness of breath, cough etc.) before then, please call [phone].  If you test positive for Covid 19 in the 2 weeks post procedure, please call and report this information to [place].    If any biopsies were taken you will be contacted by phone or by letter within the next 1-3 weeks.  Please call us at [phone] if you have not heard about the biopsies in  3 weeks.    SIGNATURES/CONFIDENTIALITY: You and/or your care partner have signed paperwork which will be entered into your electronic medical record.  These signatures attest to the fact that that the information above on your After Visit Summary has been reviewed and is understood.  Full responsibility of the confidentiality of this discharge information lies with you and/or your care-partner.

## 2021-10-10 NOTE — Op Note (Addendum)
[facility] Patient Name: Brittany [name] Procedure Date: 10/10/2021 2:07 PM MRN: [id number] Endoscopist: Mauri Pole , MD Age: 46 Referring MD:  Date of Birth: 1975-07-07 Gender: Female Account #: [id number] Procedure:                Upper GI endoscopy Indications:              Endoscopy to assess diarrhea in patient suspected                            of having celiac disease, Generalized abdominal pain Medicines:                Monitored Anesthesia Care Procedure:                Pre-Anesthesia Assessment:                           - Prior to the procedure, a History and Physical                            was performed, and patient medications and                            allergies were reviewed. The patient's tolerance of                            previous anesthesia was also reviewed. The risks                            and benefits of the procedure and the sedation                            options and risks were discussed with the patient.                            All questions were answered, and informed consent                            was obtained. Prior Anticoagulants: The patient has                            taken no previous anticoagulant or antiplatelet                            agents. ASA Grade Assessment: II - A patient with                            mild systemic disease. After reviewing the risks                            and benefits, the patient was deemed in                            satisfactory condition to undergo the procedure.  After obtaining informed consent, the endoscope was                            passed under direct vision. Throughout the                            procedure, the patient's blood pressure, pulse, and                            oxygen saturations were monitored continuously. The                            Endoscope was introduced through the mouth, and                             advanced to the second part of duodenum. The upper                            GI endoscopy was accomplished without difficulty.                            The patient tolerated the procedure well. Scope In: Scope Out: Findings:                 The Z-line was regular and was found 38 cm from the                            incisors.                           No gross lesions were noted in the entire esophagus.                           The cardia and gastric fundus were normal on                            retroflexion.                           The stomach was normal.                           The examined duodenum was normal. Biopsies for                            histology were taken with a cold forceps for                            evaluation of celiac disease. Complications:            No immediate complications. Estimated Blood Loss:     Estimated blood loss was minimal. Impression:               - Z-line regular, 38 cm from the incisors.                           -  No gross lesions in esophagus.                           - Normal stomach.                           - Normal examined duodenum. Biopsied. Recommendation:           - Patient has a contact number available for                            emergencies. The signs and symptoms of potential                            delayed complications were discussed with the                            patient. Return to normal activities tomorrow.                            Written discharge instructions were provided to the                            patient.                           - Resume previous diet. Gluten free diet                           - Continue present medications.                           - Await pathology results.                           - Follow up in office visit every 6 months Mauri Pole, MD 10/10/2021 2:57:25 PM This report has been signed electronically.

## 2021-10-10 NOTE — Progress Notes (Signed)
[facility] History and Physical   Primary Care Physician:  [name], MD   Reason for Procedure:  Celiac disease, abdominal pain  Plan:    EGD and colonoscopy with possible interventions as needed     HPI: Brittany [name] is a very pleasant 46 y.o. female here for EGD and colonoscopy for celiac disease, generalized abdominal pain and diarrhea.   The risks and benefits as well as alternatives of endoscopic procedure(s) have been discussed and reviewed. All questions answered. The patient agrees to proceed.    Past Medical History:  Diagnosis Date   ADD (attention deficit disorder)    Adenomyosis    Anemia    Asthma    Celiac disease    Diverticulitis    Diverticulosis    Heart murmur    Ovarian cyst    PCOS (polycystic ovarian syndrome)     Past Surgical History:  Procedure Laterality Date   NO PAST SURGERIES      Prior to Admission medications   Medication Sig Start Date End Date Taking? Authorizing Provider  albuterol (PROVENTIL) (2.5 MG/3ML) 0.083% nebulizer solution TAKE 3 MLS BY NEBULIZATION EVERY 6 (SIX) HOURS AS NEEDED FOR WHEEZING OR SHORTNESS OF BREATH. 05/03/20  Yes Bedsole, Amy E, MD  Amphet-Dextroamphet 3-Bead ER (MYDAYIS) 37.5 MG CP24 Take 37.5 mg by mouth daily. 10/05/21  Yes Bedsole, Amy E, MD  Budesonide (PULMICORT FLEXHALER) 90 MCG/ACT inhaler 1 inhalation twice daily 04/20/21  Yes Bedsole, Amy E, MD  L-Lysine 1000 MG TABS Take 2 tablets by mouth daily.   Yes [provider]  norgestrel-ethinyl estradiol (LOW-OGESTREL) 0.3-30 MG-MCG tablet Take 1 tablet by mouth daily.   Yes [provider]  Nutritional Supplements (NUTRITIONAL SUPPLEMENT PO) Smarty Pants Prenatals-1 tablet by mouth daily   Yes [provider]  Omega-3 1000 MG CAPS Take 1,000 mg by mouth daily.   Yes [provider]  QUERCETIN PO Take 1 tablet by mouth daily.   Yes [provider]  Turmeric 500 MG TABS Take by mouth. Take 2-3  tablets by mouth daily   Yes [provider]  VITAMIN D PO 25 mcg. Take 1/2 tablet daily   Yes [provider]  zinc gluconate 50 MG tablet Take 25 mg by mouth daily.   Yes [provider]  albuterol (VENTOLIN HFA) 108 (90 Base) MCG/ACT inhaler TAKE 2 PUFFS BY MOUTH EVERY 6 HOURS AS NEEDED FOR WHEEZE OR SHORTNESS OF BREATH 03/09/21   Bedsole, Amy E, MD  EPINEPHrine 0.3 mg/0.3 mL IJ SOAJ injection INJECT AS DIRECTED 05/03/20   Bedsole, Amy E, MD  montelukast (SINGULAIR) 10 MG tablet TAKE 1 TABLET BY MOUTH EVERYDAY AT BEDTIME 07/18/20   Bedsole, Amy E, MD    Current Outpatient Medications  Medication Sig Dispense Refill   albuterol (PROVENTIL) (2.5 MG/3ML) 0.083% nebulizer solution TAKE 3 MLS BY NEBULIZATION EVERY 6 (SIX) HOURS AS NEEDED FOR WHEEZING OR SHORTNESS OF BREATH. 150 mL 1   Amphet-Dextroamphet 3-Bead ER (MYDAYIS) 37.5 MG CP24 Take 37.5 mg by mouth daily. 30 capsule 0   Budesonide (PULMICORT FLEXHALER) 90 MCG/ACT inhaler 1 inhalation twice daily 1 each 11   L-Lysine 1000 MG TABS Take 2 tablets by mouth daily.     norgestrel-ethinyl estradiol (LOW-OGESTREL) 0.3-30 MG-MCG tablet Take 1 tablet by mouth daily.     Nutritional Supplements (NUTRITIONAL SUPPLEMENT PO) Smarty Pants Prenatals-1 tablet by mouth daily     Omega-3 1000 MG CAPS Take 1,000 mg by mouth daily.  QUERCETIN PO Take 1 tablet by mouth daily.     Turmeric 500 MG TABS Take by mouth. Take 2-3 tablets by mouth daily     VITAMIN D PO 25 mcg. Take 1/2 tablet daily     zinc gluconate 50 MG tablet Take 25 mg by mouth daily.     albuterol (VENTOLIN HFA) 108 (90 Base) MCG/ACT inhaler TAKE 2 PUFFS BY MOUTH EVERY 6 HOURS AS NEEDED FOR WHEEZE OR SHORTNESS OF BREATH 18 each 2   EPINEPHrine 0.3 mg/0.3 mL IJ SOAJ injection INJECT AS DIRECTED 2 each 0   montelukast (SINGULAIR) 10 MG tablet TAKE 1 TABLET BY MOUTH EVERYDAY AT BEDTIME 90 tablet 3   Current Facility-Administered Medications  Medication Dose Route  Frequency Provider Last Rate Last Admin   0.9 %  sodium chloride infusion  500 mL Intravenous Once Mauri Pole, MD        Allergies as of 10/10/2021 - Review Complete 10/10/2021  Allergen Reaction Noted   Gluten meal Nausea Only and Other (See Comments) 06/09/2021   Iodine  06/01/2010   Shellfish allergy  06/01/2010   Other Nausea And Vomiting 10/12/2015    Family History  Problem Relation Age of Onset   Hyperlipidemia Mother    Colon polyps Mother    Ulcerative colitis Sister    Irritable bowel syndrome Sister    Celiac disease Sister    Hypertension Brother        constrolled with diet   Heart attack Paternal Grandfather    Diabetes Maternal Aunt    Colon polyps Maternal Aunt    Heart attack Maternal Aunt    Colon polyps Maternal Aunt    Cancer Paternal Aunt        ? type    Social History   Socioeconomic History   Marital status: Divorced    Spouse name: Not on file   Number of children: 2   Years of education: Not on file   Highest education level: Not on file  Occupational History   Occupation: Pharmacist, hospital  Tobacco Use   Smoking status: Never   Smokeless tobacco: Never  Vaping Use   Vaping Use: Never used  Substance and Sexual Activity   Alcohol use: Yes    Alcohol/week: 0.0 standard drinks of alcohol    Comment: 1 drink every 2 months    Drug use: No   Sexual activity: Yes  Other Topics Concern   Not on file  Social History Narrative   Not on file   Social Determinants of Health   Financial Resource Strain: Not on file  Food Insecurity: Not on file  Transportation Needs: Not on file  Physical Activity: Not on file  Stress: Not on file  Social Connections: Not on file  Intimate Partner Violence: Not on file    Review of Systems:  All other review of systems negative except as mentioned in the HPI.  Physical Exam: Vital signs in last 24 hours: BP 136/84   Pulse 84   Temp 98.4 F (36.9 C)   Ht 5' 9"  (1.753 m)   Wt 234 lb (106.1 kg)    SpO2 100%   BMI 34.56 kg/m  General:   Alert, NAD Lungs:  Clear .   Heart:  Regular rate and rhythm Abdomen:  Soft, nontender and nondistended. Neuro/Psych:  Alert and cooperative. Normal mood and affect. A and O x 3  Reviewed labs, radiology imaging, old records and pertinent past GI work up  Patient is  appropriate for planned procedure(s) and anesthesia in an ambulatory setting   K. Denzil Magnuson , MD [phone]

## 2021-10-10 NOTE — Progress Notes (Signed)
To pacu, VSS. Report to [email or web address] 

## 2021-10-10 NOTE — Progress Notes (Signed)
Called to room to assist during endoscopic procedure.  Patient ID and intended procedure confirmed with present staff. Received instructions for my participation in the procedure from the performing physician.  

## 2021-10-10 NOTE — Progress Notes (Signed)
Pt's states no medical or surgical changes since previsit or office visit. 

## 2021-10-10 NOTE — Op Note (Addendum)
[facility] Patient Name: Brittany [name] Procedure Date: 10/10/2021 2:06 PM MRN: [id number] Endoscopist: Mauri Pole , MD Age: 46 Referring MD:  Date of Birth: 1975-04-09 Gender: Female Account #: [id number] Procedure:                Colonoscopy Indications:              Generalized abdominal pain, Change in bowel habits,                            Incidental constipation noted Medicines:                Monitored Anesthesia Care Procedure:                Pre-Anesthesia Assessment:                           - Prior to the procedure, a History and Physical                            was performed, and patient medications and                            allergies were reviewed. The patient's tolerance of                            previous anesthesia was also reviewed. The risks                            and benefits of the procedure and the sedation                            options and risks were discussed with the patient.                            All questions were answered, and informed consent                            was obtained. Prior Anticoagulants: The patient has                            taken no previous anticoagulant or antiplatelet                            agents. ASA Grade Assessment: II - A patient with                            mild systemic disease. After reviewing the risks                            and benefits, the patient was deemed in                            satisfactory condition to undergo the procedure.  After obtaining informed consent, the colonoscope                            was passed under direct vision. Throughout the                            procedure, the patient's blood pressure, pulse, and                            oxygen saturations were monitored continuously. The                            PCF-HQ190L Colonoscope was introduced through the                            anus and advanced to the  the cecum, identified by                            appendiceal orifice and ileocecal valve. The                            colonoscopy was performed without difficulty. The                            patient tolerated the procedure well. The quality                            of the bowel preparation was excellent. The                            terminal ileum, ileocecal valve, appendiceal                            orifice, and rectum were photographed. Scope In: 2:18:37 PM Scope Out: 2:32:50 PM Scope Withdrawal Time: 0 hours 8 minutes 9 seconds  Total Procedure Duration: 0 hours 14 minutes 13 seconds  Findings:                 The perianal and digital rectal examinations were                            normal.                           A patchy area of mild melanosis was found in the                            entire colon.                           A 1 mm polyp was found in the transverse colon. The                            polyp was sessile. The polyp was removed with a  cold biopsy forceps. Resection and retrieval were                            complete.                           Non-bleeding external and internal hemorrhoids were                            found during retroflexion. The hemorrhoids were                            small. Complications:            No immediate complications. Estimated Blood Loss:     Estimated blood loss was minimal. Impression:               - Melanosis in the colon.                           - One 1 mm polyp in the transverse colon, removed                            with a cold biopsy forceps. Resected and retrieved.                           - Non-bleeding external and internal hemorrhoids. Recommendation:           - Patient has a contact number available for                            emergencies. The signs and symptoms of potential                            delayed complications were discussed with the                             patient. Return to normal activities tomorrow.                            Written discharge instructions were provided to the                            patient.                           - Resume previous diet.                           - Continue present medications.                           - Await pathology results.                           - Repeat colonoscopy in 7-10 years for surveillance  based on pathology results. Mauri Pole, MD 10/10/2021 2:52:45 PM This report has been signed electronically.

## 2021-10-11 ENCOUNTER — Telehealth: Payer: Self-pay

## 2021-10-11 NOTE — Telephone Encounter (Signed)
No answer, unable to leave a message, B.Rayne Cowdrey RN.

## 2021-10-12 ENCOUNTER — Telehealth: Payer: Self-pay | Admitting: Gastroenterology

## 2021-10-12 ENCOUNTER — Other Ambulatory Visit: Payer: Self-pay | Admitting: Family Medicine

## 2021-10-12 NOTE — Telephone Encounter (Signed)
Spoke with the patient. She had some abdominal cramping, "not bad." She was able to pass stool and this made her feel better. She has passed stool a couple of times now. She eating her normal diet. She is drinking fluids. Not blood with stools. She will increase her activity. Advised it may take a couple of weeks before she has a full return of her normal bowel routine. She can call back with any questions or concerns.

## 2021-10-12 NOTE — Telephone Encounter (Signed)
Patient had colonoscopy on Monday.  She had no symptoms.  Yesterday, she had a little cramping.  Overnight, she had more cramping and gas.  She just wanted to know if this was normal.  Please call and advise.  Thank you.

## 2021-10-13 ENCOUNTER — Encounter: Payer: Self-pay | Admitting: Family Medicine

## 2021-10-16 ENCOUNTER — Other Ambulatory Visit: Payer: Self-pay | Admitting: Family Medicine

## 2021-10-16 MED ORDER — METHYLPHENIDATE HCL ER (CD) 20 MG PO CPCR: 20.0000 mg | ORAL_CAPSULE | ORAL | 0 refills | Status: DC

## 2021-10-16 NOTE — Telephone Encounter (Signed)
Patient called and wanted to see if doctor Diona Browner got her my chart message about the medication. Call back number [phone].

## 2021-10-17 ENCOUNTER — Telehealth: Payer: Self-pay | Admitting: *Deleted

## 2021-10-17 NOTE — Telephone Encounter (Signed)
Received fax from [facility] requesting PA for methylphenidate 20 mg.  PA completed on CoverMyMeds and sent for review.  Can take up to 72 hours for a decision.

## 2021-10-18 NOTE — Telephone Encounter (Signed)
PA approved effective 10/17/2021 through 10/17/2024.  N[place] notified of approval via telephone.

## 2021-10-25 ENCOUNTER — Encounter (INDEPENDENT_AMBULATORY_CARE_PROVIDER_SITE_OTHER): Payer: BC Managed Care – PPO | Admitting: Family Medicine

## 2021-10-25 DIAGNOSIS — F988 Other specified behavioral and emotional disorders with onset usually occurring in childhood and adolescence: Secondary | ICD-10-CM | POA: Diagnosis not present

## 2021-10-27 MED ORDER — METHYLPHENIDATE HCL ER (CD) 30 MG PO CPCR: 30.0000 mg | ORAL_CAPSULE | ORAL | 0 refills | Status: DC

## 2021-10-27 NOTE — Telephone Encounter (Signed)
Please see the MyChart message reply(ies) for my assessment and plan.  The patient gave consent for this Medical Advice Message and is aware that it may result in a bill to their insurance company as well as the possibility that this may result in a co-payment or deductible. They are an established patient, but are not seeking medical advice exclusively about a problem treated during an in person or video visit in the last 7 days. I did not recommend an in person or video visit within 7 days of my reply.  I spent a total of 10 minutes cumulative time within 7 days through [employer] Eliezer Lofts, MD

## 2021-10-30 ENCOUNTER — Telehealth: Payer: Self-pay | Admitting: *Deleted

## 2021-10-30 ENCOUNTER — Encounter: Payer: Self-pay | Admitting: Gastroenterology

## 2021-10-30 NOTE — Telephone Encounter (Signed)
Received fax from [facility] requesting PA for methylphenidate.  PA completed on CoverMyMeds and sent for review.  Can take up to 72 hours for a decision.

## 2021-11-01 NOTE — Telephone Encounter (Signed)
PA approved from 10/30/21 through 10/30/2024.

## 2021-11-12 ENCOUNTER — Other Ambulatory Visit: Payer: Self-pay | Admitting: Family Medicine

## 2021-11-16 ENCOUNTER — Telehealth: Payer: Self-pay | Admitting: Family Medicine

## 2021-11-16 DIAGNOSIS — E7800 Pure hypercholesterolemia, unspecified: Secondary | ICD-10-CM

## 2021-11-16 NOTE — Telephone Encounter (Signed)
Patient called in and was wanting to come in to get repeat lab work. She stated that she has made some changes and wanted to mainly check cholesterol and kidney number. Please advise. Thank you!

## 2021-11-16 NOTE — Addendum Note (Signed)
Addended byEliezer Lofts E on: 11/16/2021 12:59 PM   Modules accepted: Orders

## 2021-11-17 ENCOUNTER — Other Ambulatory Visit (INDEPENDENT_AMBULATORY_CARE_PROVIDER_SITE_OTHER): Payer: BC Managed Care – PPO

## 2021-11-17 DIAGNOSIS — E7800 Pure hypercholesterolemia, unspecified: Secondary | ICD-10-CM

## 2021-11-17 NOTE — Addendum Note (Signed)
Addended by: Ellamae Sia on: 11/17/2021 03:53 PM   Modules accepted: Orders

## 2021-11-18 LAB — COMPREHENSIVE METABOLIC PANEL
AG Ratio: 1.4 (calc) (ref 1.0–2.5)
ALT: 10 U/L (ref 6–29)
AST: 17 U/L (ref 10–30)
Albumin: 4.2 g/dL (ref 3.6–5.1)
Alkaline phosphatase (APISO): 42 U/L (ref 31–125)
BUN/Creatinine Ratio: 11 (calc) (ref 6–22)
BUN: 11 mg/dL (ref 7–25)
CO2: 21 mmol/L (ref 20–32)
Calcium: 9.1 mg/dL (ref 8.6–10.2)
Chloride: 102 mmol/L (ref 98–110)
Creat: 1.03 mg/dL — ABNORMAL HIGH (ref 0.50–0.99)
Globulin: 2.9 g/dL (calc) (ref 1.9–3.7)
Glucose, Bld: 73 mg/dL (ref 65–99)
Potassium: 4 mmol/L (ref 3.5–5.3)
Sodium: 135 mmol/L (ref 135–146)
Total Bilirubin: 0.9 mg/dL (ref 0.2–1.2)
Total Protein: 7.1 g/dL (ref 6.1–8.1)

## 2021-11-18 LAB — LIPID PANEL
Cholesterol: 188 mg/dL (ref <200)
HDL: 60 mg/dL (ref >=50)
LDL Cholesterol (Calc): 115 mg/dL (calc) — ABNORMAL HIGH
Non-HDL Cholesterol (Calc): 128 mg/dL (calc) (ref <130)
Total CHOL/HDL Ratio: 3.1 (calc) (ref <5.0)
Triglycerides: 52 mg/dL (ref <150)

## 2021-12-01 ENCOUNTER — Other Ambulatory Visit: Payer: Self-pay | Admitting: Family Medicine

## 2021-12-01 MED ORDER — METHYLPHENIDATE HCL ER (CD) 30 MG PO CPCR: 30.0000 mg | ORAL_CAPSULE | ORAL | 0 refills | Status: DC

## 2021-12-01 NOTE — Telephone Encounter (Signed)
Last office visit 06/09/21 for CPE.  Last refilled 10/27/21 for #30 with no refills. No future appointments.

## 2021-12-13 ENCOUNTER — Encounter: Payer: Self-pay | Admitting: Family Medicine

## 2021-12-13 ENCOUNTER — Other Ambulatory Visit: Payer: Self-pay | Admitting: Family Medicine

## 2021-12-28 ENCOUNTER — Telehealth: Payer: Self-pay | Admitting: Family Medicine

## 2021-12-28 NOTE — Telephone Encounter (Signed)
error 

## 2022-01-02 ENCOUNTER — Other Ambulatory Visit: Payer: Self-pay | Admitting: Family Medicine

## 2022-01-02 MED ORDER — METHYLPHENIDATE HCL ER (CD) 30 MG PO CPCR: 30.0000 mg | ORAL_CAPSULE | ORAL | 0 refills | Status: DC

## 2022-01-02 NOTE — Telephone Encounter (Signed)
Name of Medication:Methylphenidate 30 mg CR Name of Pharmacy: [facility] or Written Date and Quantity: # 30 on 12/01/21 Last Office Visit and Type: 06/09/2021 annual exam Next Office Visit and Type: none scheduled Last Controlled Substance Agreement Date:none seen Last HOZ:YYQM seen  Sending med refill request to Dr Diona Browner.

## 2022-01-02 NOTE — Telephone Encounter (Signed)
Please let patient know she needs to set up 6 month follow up ADD OV for further refills. Can be virtual.

## 2022-01-03 ENCOUNTER — Encounter: Payer: Self-pay | Admitting: *Deleted

## 2022-01-03 NOTE — Telephone Encounter (Signed)
MyChart message sent to [facility] asking her to set up 6 month follow up for ADD.  She responded and states she will call office to set up.

## 2022-02-06 ENCOUNTER — Encounter: Payer: Self-pay | Admitting: Family Medicine

## 2022-02-06 ENCOUNTER — Ambulatory Visit (INDEPENDENT_AMBULATORY_CARE_PROVIDER_SITE_OTHER): Payer: BC Managed Care – PPO | Admitting: Family Medicine

## 2022-02-06 VITALS — BP 110/74 | HR 77 | Temp 98.2°F | Ht 69.0 in

## 2022-02-06 DIAGNOSIS — R42 Dizziness and giddiness: Secondary | ICD-10-CM

## 2022-02-06 DIAGNOSIS — F988 Other specified behavioral and emotional disorders with onset usually occurring in childhood and adolescence: Secondary | ICD-10-CM

## 2022-02-06 LAB — POCT GLYCOSYLATED HEMOGLOBIN (HGB A1C): Hemoglobin A1C: 4.6 % (ref 4.0–5.6)

## 2022-02-06 MED ORDER — METHYLPHENIDATE HCL ER (CD) 30 MG PO CPCR: 30.0000 mg | ORAL_CAPSULE | ORAL | 0 refills | Status: DC

## 2022-02-06 MED ORDER — METHYLPHENIDATE HCL 5 MG PO TABS: ORAL_TABLET | ORAL | 0 refills | Status: DC

## 2022-02-06 NOTE — Progress Notes (Signed)
ADD    On metadate 30 mg CD ( was on Mydayis prior) Compliant with meds: takes daily   Takes at 6:30 AM.. last 3-4 hours. Has no coverage later in day. benefit from med (ie increase in concentration): yes.. focuses more. change in mood: no change change in appetite: no  Insomnia:none tremor: none compliant with behavioral modification: trying to make environment changes.   Occ lightheadeness,, no specific association with meals. LASt a few seconda.  Strong family history of DM.   PMH and SH reviewed  ROS: Per HPI unless specifically indicated in ROS section   Meds, vitals, and allergies reviewed.  BP Readings from Last 3 Encounters:  02/06/22 110/74  10/10/21 (!) 116/56  08/08/21 118/78   Wt Readings from Last 3 Encounters:  10/10/21 234 lb (106.1 kg)  08/08/21 234 lb 12.8 oz (106.5 kg)  06/09/21 228 lb 3.2 oz (103.5 kg)     GEN: nad, alert and oriented, affect wnl and appropriate HEENT: mucous membranes moist NECK: supple w/o LA CV: rrr.  PULM: ctab, no inc wob ABD: soft, +bs EXT: no edema CN 2-12 wnl, s/s/dtr wnl x4.  No tremor.   Problem List Items Addressed This Visit     ADD (attention deficit disorder) - Primary    Chronic, inadequate control Metadate 30 mg CD works well for her but runs out swiftly after about a few hours.  Trial of short acting methylphenidate around noon for concentration late in the day. Follow-up in 3 months for reevaluation.      Relevant Medications   methylphenidate (RITALIN) 5 MG tablet   methylphenidate (METADATE CD) 30 MG CR capsule   methylphenidate (METADATE CD) 30 MG CR capsule   methylphenidate (METADATE CD) 30 MG CR capsule   Dizziness    Acute, evaluate with labs.  No suggestion of cardiac, pulmonary source or hypertension or orthostatic hypotension      Relevant Orders   POCT glycosylated hemoglobin (Hb A1C) (Completed)   CBC with Differential/Platelet   TSH   Basic Metabolic Panel   Vitamin W09   .

## 2022-02-06 NOTE — Patient Instructions (Signed)
Trial of short acting methylphenidate around noon for concentration late in the day.

## 2022-02-07 ENCOUNTER — Other Ambulatory Visit (HOSPITAL_COMMUNITY): Payer: Self-pay

## 2022-02-07 ENCOUNTER — Telehealth: Payer: Self-pay

## 2022-02-07 NOTE — Telephone Encounter (Signed)
Noted  

## 2022-02-07 NOTE — Telephone Encounter (Signed)
Pharmacy Patient Advocate Encounter   Received notification from [facility] that prior authorization for Methylphenidate 96m tabs is required/requested.    PA submitted on 02/07/2022 to [employer] [employer] Status is pending

## 2022-02-08 ENCOUNTER — Other Ambulatory Visit (HOSPITAL_COMMUNITY): Payer: Self-pay

## 2022-02-08 NOTE — Telephone Encounter (Signed)
Received notification from [facility] regarding a prior authorization for Methylphenidate 35m.  Authorization has been APPROVED from 02/07/22 to 02/07/25.   Key: [id number] PA Case ID: [id number]

## 2022-03-14 LAB — HM MAMMOGRAPHY

## 2022-03-15 NOTE — Assessment & Plan Note (Signed)
Chronic, inadequate control Metadate 30 mg CD works well for her but runs out swiftly after about a few hours.  Trial of short acting methylphenidate around noon for concentration late in the day. Follow-up in 3 months for reevaluation.

## 2022-03-15 NOTE — Assessment & Plan Note (Signed)
Acute, evaluate with labs.  No suggestion of cardiac, pulmonary source or hypertension or orthostatic hypotension

## 2022-04-16 ENCOUNTER — Other Ambulatory Visit: Payer: Self-pay | Admitting: Family Medicine

## 2022-04-16 DIAGNOSIS — F988 Other specified behavioral and emotional disorders with onset usually occurring in childhood and adolescence: Secondary | ICD-10-CM

## 2022-04-16 MED ORDER — METHYLPHENIDATE HCL 5 MG PO TABS: ORAL_TABLET | ORAL | 0 refills | Status: DC

## 2022-04-16 NOTE — Telephone Encounter (Signed)
Spoke to [employer] pharmacist at [facility] and was advised that patient has a refill on this medication.

## 2022-04-16 NOTE — Telephone Encounter (Signed)
Refill request Ritalin Last refill 02/06/22 #30 Last office visit 02/06/22

## 2022-05-01 ENCOUNTER — Other Ambulatory Visit: Payer: Self-pay | Admitting: Family Medicine

## 2022-05-07 ENCOUNTER — Encounter: Payer: Self-pay | Admitting: Family Medicine

## 2022-05-11 NOTE — Telephone Encounter (Signed)
Mom is asking about her daughter's Journalist, newspaper.

## 2022-05-22 ENCOUNTER — Encounter: Payer: Self-pay | Admitting: Family Medicine

## 2022-05-22 NOTE — Telephone Encounter (Signed)
There is only Methylphenidate (Metadate CD) 30 mg and methylphenidate 5 mg on current medication list.  Patient is asking for regular methylphenidate 30 mg.    Last office visit 02/06/22 for ADD.  Please advise.

## 2022-05-24 ENCOUNTER — Encounter: Payer: Self-pay | Admitting: Family Medicine

## 2022-05-24 ENCOUNTER — Telehealth (INDEPENDENT_AMBULATORY_CARE_PROVIDER_SITE_OTHER): Payer: BC Managed Care – PPO | Admitting: Family Medicine

## 2022-05-24 DIAGNOSIS — F988 Other specified behavioral and emotional disorders with onset usually occurring in childhood and adolescence: Secondary | ICD-10-CM | POA: Diagnosis not present

## 2022-05-24 MED ORDER — METHYLPHENIDATE HCL 5 MG PO TABS: ORAL_TABLET | ORAL | 0 refills | Status: DC

## 2022-05-24 MED ORDER — METHYLPHENIDATE HCL ER (CD) 30 MG PO CPCR: 30.0000 mg | ORAL_CAPSULE | ORAL | 0 refills | Status: DC

## 2022-05-24 NOTE — Progress Notes (Signed)
VIRTUAL VISIT A virtual visit is felt to be most appropriate for this patient at this time.   I connected with the patient on 05/24/22 at 11:40 AM EST by virtual telehealth platform and verified that I am speaking with the correct person using two identifiers.   I discussed the limitations, risks, security and privacy concerns of performing an evaluation and management service by  virtual telehealth platform and the availability of in person appointments. I also discussed with the patient that there may be a patient responsible charge related to this service. The patient expressed understanding and agreed to proceed.  Patient location: Home Provider Location: [facility] Participants: Brittany [name] and Brittany [name]   Chief Complaint  Patient presents with   ADD    History of Present Illness:  47 y.o. female patient of Brittany Sanders, MD presents with  ADD  ADD  At last office visit November 2023 changed Metadate 30 mg CD given it ran out after a few hours to Metadate 30 mg CD with additional short acting methylphenidate around noon for concentration later in the day. Compliant with meds: benefit from med (ie increase in concentration): change in mood: some anxiety with stress change in appetite: none Insomnia:none tremor:none compliant with behavioral modification:   PMH and SH reviewed  ROS: Per HPI unless specifically indicated in ROS section   Meds, vitals, and allergies reviewed.   Attention deficit disorder (ADD) without hyperactivity Assessment & Plan: Chronic, Well-controlled with Metadate CD30 milligrams in the morning with additional dose of short acting 5 mg methylphenidate later in the day.  She states she prefers the CD form of 30 mg Metadate compared to the ER form.  No red flags, PDMP reviewed at this office visit. Will send in prescriptions for 3 months worth of Metadate CD30 milligrams daily with 1 prescription of methylphenidate short acting 5 mg to  use if needed later in the day if she has a long workday.  Orders: -     Methylphenidate HCl ER (CD); Take 1 capsule (30 mg total) by mouth every morning.  Dispense: 30 capsule; Refill: 0 -     Methylphenidate HCl ER (CD); Take 1 capsule (30 mg total) by mouth every morning.  Dispense: 30 capsule; Refill: 0 -     Methylphenidate HCl ER (CD); Take 1 capsule (30 mg total) by mouth every morning. Do not fill until July 23, 2022, Patient prefers CD only.  Do not fill with ER  Dispense: 30 capsule; Refill: 0 -     Methylphenidate HCl; Take 1 tablet around noon  as needed for concentration later in the day.  Dispense: 30 tablet; Refill: 0       COVID 19 screen No recent travel or known exposure to [place] The patient denies respiratory symptoms of COVID 19 at this time.  The importance of social distancing was discussed today.   ROS    Past Medical History:  Diagnosis Date   ADD (attention deficit disorder)    Adenomyosis    Anemia    Asthma    Celiac disease    Diverticulitis    Diverticulosis    Heart murmur    Ovarian cyst    PCOS (polycystic ovarian syndrome)     reports that she has never smoked. She has never used smokeless tobacco. She reports current alcohol use. She reports that she does not use drugs.   Current Outpatient Medications:    albuterol (PROVENTIL) (2.5 MG/3ML) 0.083% nebulizer solution,  TAKE 3 MLS BY NEBULIZATION EVERY 6 (SIX) HOURS AS NEEDED FOR WHEEZING OR SHORTNESS OF BREATH., Disp: 150 mL, Rfl: 1   albuterol (VENTOLIN HFA) 108 (90 Base) MCG/ACT inhaler, TAKE 2 PUFFS BY MOUTH EVERY 6 HOURS AS NEEDED FOR WHEEZE OR SHORTNESS OF BREATH, Disp: 18 each, Rfl: 2   Budesonide (PULMICORT FLEXHALER) 90 MCG/ACT inhaler, INHALE 1 PUFF BY MOUTH TWICE A DAY, Disp: 1 each, Rfl: 2   EPINEPHrine 0.3 mg/0.3 mL IJ SOAJ injection, INJECT AS DIRECTED, Disp: 2 each, Rfl: 0   L-Lysine 1000 MG TABS, Take 2 tablets by mouth daily., Disp: , Rfl:    methylphenidate (METADATE CD) 30  MG CR capsule, Take 1 capsule (30 mg total) by mouth every morning., Disp: 30 capsule, Rfl: 0   methylphenidate (METADATE CD) 30 MG CR capsule, Take 1 capsule (30 mg total) by mouth every morning., Disp: 30 capsule, Rfl: 0   methylphenidate (METADATE CD) 30 MG CR capsule, Take 1 capsule (30 mg total) by mouth every morning., Disp: 30 capsule, Rfl: 0   methylphenidate (RITALIN) 5 MG tablet, Take 1 tablet around noon  as needed for concentration later in the day., Disp: 30 tablet, Rfl: 0   montelukast (SINGULAIR) 10 MG tablet, TAKE 1 TABLET BY MOUTH EVERYDAY AT BEDTIME, Disp: 90 tablet, Rfl: 1   Omega-3 1000 MG CAPS, Take 1,000 mg by mouth daily., Disp: , Rfl:    QUERCETIN PO, Take 1 tablet by mouth daily., Disp: , Rfl:    Turmeric 500 MG TABS, Take by mouth. Take 2-3 tablets by mouth daily, Disp: , Rfl:    VITAMIN D PO, 25 mcg. Take 1/2 tablet daily, Disp: , Rfl:    zinc gluconate 50 MG tablet, Take 25 mg by mouth daily., Disp: , Rfl:    Observations/Objective: Height '5\' 9"'$  (1.753 m), last menstrual period 05/16/2022.  Physical Exam Constitutional:      General: The patient is not in acute distress. Pulmonary:     Effort: Pulmonary effort is normal. No respiratory distress.  Neurological:     Mental Status: The patient is alert and oriented to person, place, and time.  Psychiatric:        Mood and Affect: Mood normal.        Behavior: Behavior normal.    Assessment and Plan There are no diagnoses linked to this encounter.    I discussed the assessment and treatment plan with the patient. The patient was provided an opportunity to ask questions and all were answered. The patient agreed with the plan and demonstrated an understanding of the instructions.   The patient was advised to call back or seek an in-person evaluation if the symptoms worsen or if the condition fails to improve as anticipated.     Brittany Lofts, MD

## 2022-05-24 NOTE — Assessment & Plan Note (Signed)
Chronic, Well-controlled with Metadate CD30 milligrams in the morning with additional dose of short acting 5 mg methylphenidate later in the day.  She states she prefers the CD form of 30 mg Metadate compared to the ER form.  No red flags, PDMP reviewed at this office visit. Will send in prescriptions for 3 months worth of Metadate CD30 milligrams daily with 1 prescription of methylphenidate short acting 5 mg to use if needed later in the day if she has a long workday.

## 2022-05-29 ENCOUNTER — Other Ambulatory Visit: Payer: Self-pay | Admitting: Family Medicine

## 2022-05-29 DIAGNOSIS — E7800 Pure hypercholesterolemia, unspecified: Secondary | ICD-10-CM

## 2022-05-31 ENCOUNTER — Encounter: Payer: Self-pay | Admitting: Family Medicine

## 2022-05-31 ENCOUNTER — Ambulatory Visit: Payer: BC Managed Care – PPO | Admitting: Family Medicine

## 2022-05-31 DIAGNOSIS — E282 Polycystic ovarian syndrome: Secondary | ICD-10-CM

## 2022-05-31 MED ORDER — METFORMIN HCL ER 500 MG PO TB24: 500.0000 mg | ORAL_TABLET | Freq: Every day | ORAL | 3 refills | Status: DC

## 2022-05-31 NOTE — Patient Instructions (Signed)
Start metformin 500 mg daily for weight management and PCOS.  Keep working on healthy eating and regular exercise.  Look into insurance coverage of Wegovy or Zepbound in a nondiabetic.

## 2022-05-31 NOTE — Progress Notes (Signed)
Patient ID: Brittany [name], female    DOB: 20-May-1975, 47 y.o.   MRN: [id number]  This visit was conducted in person.  BP 110/78 (BP Location: Left Arm, Patient Position: Sitting, Cuff Size: Large)   Pulse 90   Temp 97.8 F (36.6 C)   Ht '5\' 9"'$  (1.753 m)   Wt 270 lb (122.5 kg)   LMP 05/16/2022   SpO2 98%   BMI 39.87 kg/m    CC:  Chief Complaint  Patient presents with   Weight Gain    Pt has been gaining weight. Wants to talk about options. Lost 175 lbs on her own in the past. Thinks it was a hormone change.     Subjective:   HPI: Brittany [name] is a 47 y.o. female presenting on 05/31/2022 for Weight Gain (Pt has been gaining weight. Wants to talk about options. Lost 175 lbs on her own in the past. Thinks it was a hormone change. )   She has lost weight overtime  since 2008 .Marland Kitchen 175 lbs... has had  issue with weight gain back  since starting OCPs ( for adenomyosis)... she is now off the medication.  She has been working with nutritionist for natural treatment of meds.   1 month aog feel and got injured.. has not been able to lose weight.   Working out  with trainer regularly.  Food journal.    Obesity, BMI 39 Associated with morbidity including acid reflux, polycystic ovarian syndrome and high cholesterol She is on stimulant medication treatment for ADD. Wt Readings from Last 3 Encounters:  05/31/22 270 lb (122.5 kg)  10/10/21 234 lb (106.1 kg)  08/08/21 234 lb 12.8 oz (106.5 kg)         Relevant past medical, surgical, family and social history reviewed and updated as indicated. Interim medical history since our last visit reviewed. Allergies and medications reviewed and updated. Outpatient Medications Prior to Visit  Medication Sig Dispense Refill   albuterol (PROVENTIL) (2.5 MG/3ML) 0.083% nebulizer solution TAKE 3 MLS BY NEBULIZATION EVERY 6 (SIX) HOURS AS NEEDED FOR WHEEZING OR SHORTNESS OF BREATH. 150 mL 1   albuterol (VENTOLIN HFA) 108 (90 Base) MCG/ACT inhaler  TAKE 2 PUFFS BY MOUTH EVERY 6 HOURS AS NEEDED FOR WHEEZE OR SHORTNESS OF BREATH 18 each 2   Budesonide (PULMICORT FLEXHALER) 90 MCG/ACT inhaler INHALE 1 PUFF BY MOUTH TWICE A DAY 1 each 2   EPINEPHrine 0.3 mg/0.3 mL IJ SOAJ injection INJECT AS DIRECTED 2 each 0   L-Lysine 1000 MG TABS Take 2 tablets by mouth daily.     methylphenidate (METADATE CD) 30 MG CR capsule Take 1 capsule (30 mg total) by mouth every morning. 30 capsule 0   methylphenidate (METADATE CD) 30 MG CR capsule Take 1 capsule (30 mg total) by mouth every morning. Do not fill until July 23, 2022, Patient prefers CD only.  Do not fill with ER 30 capsule 0   methylphenidate (RITALIN) 5 MG tablet Take 1 tablet around noon  as needed for concentration later in the day. 30 tablet 0   montelukast (SINGULAIR) 10 MG tablet TAKE 1 TABLET BY MOUTH EVERYDAY AT BEDTIME 90 tablet 1   Omega-3 1000 MG CAPS Take 1,000 mg by mouth daily.     QUERCETIN PO Take 1 tablet by mouth daily.     Turmeric 500 MG TABS Take by mouth. Take 2-3 tablets by mouth daily     VITAMIN D PO 25 mcg. Take 1/2  tablet daily     zinc gluconate 50 MG tablet Take 25 mg by mouth daily.     methylphenidate (METADATE CD) 30 MG CR capsule Take 1 capsule (30 mg total) by mouth every morning. 30 capsule 0   No facility-administered medications prior to visit.     Per HPI unless specifically indicated in ROS section below Review of Systems  Constitutional:  Negative for fatigue and fever.  HENT:  Negative for congestion.   Eyes:  Negative for pain.  Respiratory:  Negative for cough and shortness of breath.   Cardiovascular:  Negative for chest pain, palpitations and leg swelling.  Gastrointestinal:  Negative for abdominal pain.  Genitourinary:  Negative for dysuria and vaginal bleeding.  Musculoskeletal:  Negative for back pain.  Neurological:  Negative for syncope, light-headedness and headaches.  Psychiatric/Behavioral:  Negative for dysphoric mood.    Objective:   BP 110/78 (BP Location: Left Arm, Patient Position: Sitting, Cuff Size: Large)   Pulse 90   Temp 97.8 F (36.6 C)   Ht '5\' 9"'$  (1.753 m)   Wt 270 lb (122.5 kg)   LMP 05/16/2022   SpO2 98%   BMI 39.87 kg/m   Wt Readings from Last 3 Encounters:  05/31/22 270 lb (122.5 kg)  10/10/21 234 lb (106.1 kg)  08/08/21 234 lb 12.8 oz (106.5 kg)      Physical Exam Constitutional:      General: She is not in acute distress.    Appearance: Normal appearance. She is well-developed. She is obese. She is not ill-appearing or toxic-appearing.  HENT:     Head: Normocephalic.     Right Ear: Hearing, tympanic membrane, ear canal and external ear normal. Tympanic membrane is not erythematous, retracted or bulging.     Left Ear: Hearing, tympanic membrane, ear canal and external ear normal. Tympanic membrane is not erythematous, retracted or bulging.     Nose: No mucosal edema or rhinorrhea.     Right Sinus: No maxillary sinus tenderness or frontal sinus tenderness.     Left Sinus: No maxillary sinus tenderness or frontal sinus tenderness.     Mouth/Throat:     Pharynx: Uvula midline.  Eyes:     General: Lids are normal. Lids are everted, no foreign bodies appreciated.     Conjunctiva/sclera: Conjunctivae normal.     Pupils: Pupils are equal, round, and reactive to light.  Neck:     Thyroid: No thyroid mass or thyromegaly.     Vascular: No carotid bruit.     Trachea: Trachea normal.  Cardiovascular:     Rate and Rhythm: Normal rate and regular rhythm.     Pulses: Normal pulses.     Heart sounds: Normal heart sounds, S1 normal and S2 normal. No murmur heard.    No friction rub. No gallop.  Pulmonary:     Effort: Pulmonary effort is normal. No tachypnea or respiratory distress.     Breath sounds: Normal breath sounds. No decreased breath sounds, wheezing, rhonchi or rales.  Abdominal:     General: Bowel sounds are normal.     Palpations: Abdomen is soft.     Tenderness: There is no abdominal  tenderness.  Musculoskeletal:     Cervical back: Normal range of motion and neck supple.  Skin:    General: Skin is warm and dry.     Findings: No rash.  Neurological:     Mental Status: She is alert.  Psychiatric:        Mood  and Affect: Mood is not anxious or depressed.        Speech: Speech normal.        Behavior: Behavior normal. Behavior is cooperative.        Thought Content: Thought content normal.        Judgment: Judgment normal.       Results for orders placed or performed in visit on 02/06/22  POCT glycosylated hemoglobin (Hb A1C)  Result Value Ref Range   Hemoglobin A1C 4.6 4.0 - 5.6 %   HbA1c POC (<> result, manual entry)     HbA1c, POC (prediabetic range)     HbA1c, POC (controlled diabetic range)      Assessment and Plan  Severe obesity (BMI 35.0-39.9) with comorbidity ([place]) Assessment & Plan: Chronic, more difficulty with losing weight since recent birth control trial.  She feels hormonal fluctuations and issues with PCOS have affected her and caused weight gain.  Medication like phentermine is contraindicated She is a good candidate for metformin which she has been on in the past.  We will start 500 mg  XR p.o. daily She will continue working on healthy lifestyle, diet logging and regular exercise.  Discussed using a GLP-1 medication, she will look into insurance coverage for nondiabetic.    PCOS (polycystic ovarian syndrome)  Other orders -     metFORMIN HCl ER; Take 1 tablet (500 mg total) by mouth daily with breakfast.  Dispense: 30 tablet; Refill: 3    No follow-ups on file.   Eliezer Lofts, MD

## 2022-05-31 NOTE — Assessment & Plan Note (Signed)
Chronic, more difficulty with losing weight since recent birth control trial.  She feels hormonal fluctuations and issues with PCOS have affected her and caused weight gain.  Medication like phentermine is contraindicated She is a good candidate for metformin which she has been on in the past.  We will start 500 mg  XR p.o. daily She will continue working on healthy lifestyle, diet logging and regular exercise.  Discussed using a GLP-1 medication, she will look into insurance coverage for nondiabetic.

## 2022-06-03 NOTE — Progress Notes (Signed)
Ieisha Gao T. Khadim Lundberg, MD, CAQ Sports Medicine [facility] [address] [place], [address]  Phone: [phone]  FAX: [phone]  GWEN WOGOMAN - 47 y.o. female  MRN [id number]  Date of Birth: 16-Aug-1975  Date: 06/04/2022  PCP: Excell Seltzer, MD  Referral: Excell Seltzer, MD  Chief Complaint  Patient presents with   Fall    At work in February    Leg Pain    Back on Left Leg   Subjective:   CECIL AUGUST is a 47 y.o. very pleasant female patient with Body mass index is 39.5 kg/m. who presents with the following:  The patient presents with left leg and knee pain.  Larey Seat a couple of weeks ago at work, and has been getting better.  Weird, awful pain on the L posterior leg.  It has slowly been getting better over time.  She does not have an effusion in either knee.  The right side and particularly has been getting better, while initially the right knee was the worst source of pain.  Initially she continued to work out, dance, and she ultimately had to stop this secondary to pain.  Now excruciating at night.  This is only on the left on the posterior knee and posterior leg.  She does have an area of palpable concern in the popliteal fossa.  Fell over to the L, both knees were hurting, and R knee was the worst then.   R knee is better.   Continued dancing, working out - front was really hurting.   Back will jar her more than the front of her knee.  She is primarily worried about a quart of tissue in the back of her left knee.   Review of Systems is noted in the HPI, as appropriate  Objective:   BP 120/80   Pulse 70   Temp 97.8 F (36.6 C) (Temporal)   Ht 5\' 9"  (1.753 m)   Wt 267 lb 8 oz (121.3 kg)   LMP 05/16/2022   SpO2 98%   BMI 39.50 kg/m   GEN: No acute distress; alert,appropriate. PULM: Breathing comfortably in no respiratory distress PSYCH: Normally interactive.   Bilateral knee exam.  Full extension.  Flexion to 130.   Stable to varus and valgus stress.  No effusion.  No pain with loading the medial lateral patellar facets. Minimal joint line tenderness. Negative Homans.  No pain with squeezing the calf at all in either leg. She does have a somewhat tender cord of tissue on the posterior left leg, and this seems to correlate with the patient's hamstring tendon. McMurray's and flexion pinch are negative  Strength is 5/5, she is otherwise neurovascularly intact.  Laboratory and Imaging Data:  Assessment and Plan:     ICD-10-CM   1. Acute leg pain, left  M79.605 US Venous Img Lower Unilateral Left (DVT)    CANCELED: US Venous Img Lower Unilateral Left (DVT)    2. Acute pain of left knee  M25.562      Acute knee pain after trauma.  She is recovering slowly and I would anticipate that she will continue to do well.  She does have her area of maximal pain in the posterior left knee.  I do not really appreciate any significant posterior medial joint line tenderness or any other posterolateral joint line tenderness.  The area in question to me corresponds with a hamstring tendon and the posterior aspect of the knee.  She was very concerned about DVT, so we will get an ultrasound of this area and it was completely negative.  Reassurance, continue with NSAIDs, basic range of motion and return to activity as tolerated.  Orders placed today for conditions managed today: Orders Placed This Encounter  Procedures   US Venous Img Lower Unilateral Left (DVT)    Disposition: No follow-ups on file.  [employer] One speech-to-text software was used for transcription in this dictation.  Possible transcriptional errors can occur using Animal nutritionist.   Signed,  Elpidio Galea. Anand Tejada, MD   Outpatient Encounter Medications as of 06/04/2022  Medication Sig   albuterol (PROVENTIL) (2.5 MG/3ML) 0.083% nebulizer solution TAKE 3 MLS BY NEBULIZATION EVERY 6 (SIX) HOURS AS NEEDED FOR WHEEZING OR SHORTNESS OF BREATH.    albuterol (VENTOLIN HFA) 108 (90 Base) MCG/ACT inhaler TAKE 2 PUFFS BY MOUTH EVERY 6 HOURS AS NEEDED FOR WHEEZE OR SHORTNESS OF BREATH   Budesonide (PULMICORT FLEXHALER) 90 MCG/ACT inhaler INHALE 1 PUFF BY MOUTH TWICE A DAY   EPINEPHrine 0.3 mg/0.3 mL IJ SOAJ injection INJECT AS DIRECTED   L-Lysine 1000 MG TABS Take 2 tablets by mouth daily.   metFORMIN (GLUCOPHAGE-XR) 500 MG 24 hr tablet Take 1 tablet (500 mg total) by mouth daily with breakfast.   methylphenidate (METADATE CD) 30 MG CR capsule Take 1 capsule (30 mg total) by mouth every morning.   methylphenidate (METADATE CD) 30 MG CR capsule Take 1 capsule (30 mg total) by mouth every morning. Do not fill until July 23, 2022, Patient prefers CD only.  Do not fill with ER   methylphenidate (RITALIN) 5 MG tablet Take 1 tablet around noon  as needed for concentration later in the day.   montelukast (SINGULAIR) 10 MG tablet TAKE 1 TABLET BY MOUTH EVERYDAY AT BEDTIME   Omega-3 1000 MG CAPS Take 1,000 mg by mouth daily.   QUERCETIN PO Take 1 tablet by mouth daily.   Turmeric 500 MG TABS Take by mouth. Take 2-3 tablets by mouth daily   VITAMIN D PO 25 mcg. Take 1/2 tablet daily   zinc gluconate 50 MG tablet Take 25 mg by mouth daily.   No facility-administered encounter medications on file as of 06/04/2022.

## 2022-06-04 ENCOUNTER — Ambulatory Visit: Payer: BC Managed Care – PPO | Admitting: Family Medicine

## 2022-06-04 ENCOUNTER — Encounter: Payer: Self-pay | Admitting: Family Medicine

## 2022-06-04 ENCOUNTER — Ambulatory Visit
Admission: RE | Admit: 2022-06-04 | Discharge: 2022-06-04 | Disposition: A | Discharge status: Home / Self Care | Payer: BC Managed Care – PPO | Source: Ambulatory Visit | Attending: Family Medicine | Admitting: Family Medicine

## 2022-06-04 VITALS — BP 120/80 | HR 70 | Temp 97.8°F | Ht 69.0 in | Wt 267.5 lb

## 2022-06-04 DIAGNOSIS — M79605 Pain in left leg: Secondary | ICD-10-CM

## 2022-06-04 DIAGNOSIS — M25562 Pain in left knee: Secondary | ICD-10-CM | POA: Diagnosis not present

## 2022-06-05 ENCOUNTER — Encounter: Payer: Self-pay | Admitting: Family Medicine

## 2022-06-13 ENCOUNTER — Other Ambulatory Visit: Payer: Self-pay | Admitting: Family Medicine

## 2022-06-13 ENCOUNTER — Other Ambulatory Visit (INDEPENDENT_AMBULATORY_CARE_PROVIDER_SITE_OTHER): Payer: BC Managed Care – PPO

## 2022-06-13 ENCOUNTER — Ambulatory Visit
Admission: RE | Admit: 2022-06-13 | Discharge: 2022-06-13 | Disposition: A | Discharge status: Home / Self Care | Payer: BC Managed Care – PPO | Source: Ambulatory Visit | Attending: Family Medicine | Admitting: Family Medicine

## 2022-06-13 ENCOUNTER — Ambulatory Visit: Payer: BC Managed Care – PPO | Admitting: Family Medicine

## 2022-06-13 ENCOUNTER — Encounter: Payer: Self-pay | Admitting: Family Medicine

## 2022-06-13 ENCOUNTER — Ambulatory Visit (INDEPENDENT_AMBULATORY_CARE_PROVIDER_SITE_OTHER)
Admission: RE | Admit: 2022-06-13 | Discharge: 2022-06-13 | Disposition: A | Discharge status: Home / Self Care | Payer: BC Managed Care – PPO | Source: Ambulatory Visit | Attending: Family Medicine | Admitting: Family Medicine

## 2022-06-13 VITALS — BP 100/60 | HR 90 | Temp 97.0°F | Ht 69.0 in

## 2022-06-13 DIAGNOSIS — M25561 Pain in right knee: Secondary | ICD-10-CM

## 2022-06-13 DIAGNOSIS — M25562 Pain in left knee: Secondary | ICD-10-CM

## 2022-06-13 DIAGNOSIS — R42 Dizziness and giddiness: Secondary | ICD-10-CM | POA: Diagnosis not present

## 2022-06-13 DIAGNOSIS — E7800 Pure hypercholesterolemia, unspecified: Secondary | ICD-10-CM

## 2022-06-13 LAB — CBC WITH DIFFERENTIAL/PLATELET
Basophils Absolute: 0 10*3/uL (ref 0.0–0.1)
Basophils Relative: 1.2 % (ref 0.0–3.0)
Eosinophils Absolute: 0.1 10*3/uL (ref 0.0–0.7)
Eosinophils Relative: 1.7 % (ref 0.0–5.0)
HCT: 38.4 % (ref 36.0–46.0)
Hemoglobin: 12.4 g/dL (ref 12.0–15.0)
Lymphocytes Relative: 42.4 % (ref 12.0–46.0)
Lymphs Abs: 1.7 10*3/uL (ref 0.7–4.0)
MCHC: 32.4 g/dL (ref 30.0–36.0)
MCV: 84 fl (ref 78.0–100.0)
Monocytes Absolute: 0.3 10*3/uL (ref 0.1–1.0)
Monocytes Relative: 6.5 % (ref 3.0–12.0)
Neutro Abs: 1.9 10*3/uL (ref 1.4–7.7)
Neutrophils Relative %: 48.2 % (ref 43.0–77.0)
Platelets: 283 10*3/uL (ref 150.0–400.0)
RBC: 4.57 Mil/uL (ref 3.87–5.11)
RDW: 13.5 % (ref 11.5–15.5)
WBC: 4 10*3/uL (ref 4.0–10.5)

## 2022-06-13 LAB — COMPREHENSIVE METABOLIC PANEL
ALT: 13 U/L (ref 0–35)
AST: 23 U/L (ref 0–37)
Albumin: 4.1 g/dL (ref 3.5–5.2)
Alkaline Phosphatase: 52 U/L (ref 39–117)
BUN: 10 mg/dL (ref 6–23)
CO2: 28 mEq/L (ref 19–32)
Calcium: 9.2 mg/dL (ref 8.4–10.5)
Chloride: 104 mEq/L (ref 96–112)
Creatinine, Ser: 0.97 mg/dL (ref 0.40–1.20)
GFR: 70.89 mL/min (ref >60.00)
Glucose, Bld: 78 mg/dL (ref 70–99)
Potassium: 3.9 mEq/L (ref 3.5–5.1)
Sodium: 138 mEq/L (ref 135–145)
Total Bilirubin: 0.7 mg/dL (ref 0.2–1.2)
Total Protein: 7.4 g/dL (ref 6.0–8.3)

## 2022-06-13 LAB — LIPID PANEL
Cholesterol: 190 mg/dL (ref 0–200)
HDL: 65.2 mg/dL (ref >39.00)
LDL Cholesterol: 119 mg/dL — ABNORMAL HIGH (ref 0–99)
NonHDL: 124.92
Total CHOL/HDL Ratio: 3
Triglycerides: 30 mg/dL (ref 0.0–149.0)
VLDL: 6 mg/dL (ref 0.0–40.0)

## 2022-06-13 LAB — VITAMIN B12: Vitamin B-12: 401 pg/mL (ref 211–911)

## 2022-06-13 MED ORDER — CELECOXIB 200 MG PO CAPS: 200.0000 mg | ORAL_CAPSULE | Freq: Every day | ORAL | 2 refills | Status: DC

## 2022-06-13 NOTE — Progress Notes (Signed)
Brittany Minkin T. Falana Clagg, MD, Brittany [name] at [facility] [place], [address]  Phone: [phone]  FAX: [place] - 47 y.o. female  MRN [id number]  Date of Birth: 1975-04-23  Date: 06/13/2022  PCP: [name], MD  Referral: [name], MD  Chief Complaint  Patient presents with   Leg Pain    Right   Subjective:   Brittany [name] is a 47 y.o. very pleasant female patient with Body mass index is 39.5 kg/m. who presents with the following:  Patient now presents with right-sided leg pain.  I saw her 1 week ago with left-sided left leg pain and posterior knee pain.  We did do an DVT on the left side, there is no DVT.  Walked a mile yesterday, now with pain in the R knee.  Had a few days where she was really feeling good.  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   Yesterday, she walk for a mile, and that she woke up this morning and was having rather severe right-sided knee pain.  No effusion.  No buckling.  No mechanical giving way.  She did have some knee pain back in 2019 on the left side.  L leg and knee has been doing better.  R knee lot of popping with moving her R knee.  No pain with going to the bathroom in the middle of the night.   Review of Systems is noted in the HPI, as appropriate  Objective:   BP 100/60   Pulse 90   Temp (!) 97 F (36.1 C) (Temporal)   Ht 5\' 9"  (1.753 m)   LMP 05/16/2022   SpO2 99%   BMI 39.50 kg/m   GEN: No acute distress; alert,appropriate. PULM: Breathing comfortably in no respiratory distress PSYCH: Normally interactive.   Right knee: Full extension.  Flexion to 100 degrees. Mild pain with loading the patella and with grind maneuver. Stable to varus and valgus stress. ACL and PCL are intact No significant joint line tenderness. She does have some pain with forced flexion and with McMurray's but without  mechanical symptoms  Laboratory and Imaging Data:  Assessment and Plan:     ICD-10-CM   1. Acute pain of right knee  M25.561 CANCELED: DG Knee Complete 4 Views Right    2. Acute pain of left knee  M25.562 DG Knee 4 Views W/Patella Left     Acute on chronic right-sided intermittent knee pain with exacerbation.  She does have some mild bilateral knee osteoarthritis at baseline.  Activity modification, decrease higher intensity activity and progress very slowly.  I am going to place her on Celebrex scheduled for the next 2 to 3 weeks.  Will go ahead and check some baseline knee x-rays.  Medication Management during today's office visit: Meds ordered this encounter  Medications   celecoxib (CELEBREX) 200 MG capsule    Sig: Take 1 capsule (200 mg total) by mouth daily.    Dispense:  30 capsule    Refill:  2   There are no discontinued medications.  Orders placed today for conditions managed today: Orders Placed This Encounter  Procedures   DG Knee 4 Views W/Patella Left    Disposition: No follow-ups on file.  Dr[employer] One speech-to-text software was used for transcription in this dictation.  Possible transcriptional errors can occur using Editor, commissioning.   Signed,  Maribella Kuna T. Michele Judy, MD   Outpatient Encounter Medications as of 06/13/2022  Medication Sig   albuterol (PROVENTIL) (2.5 MG/3ML) 0.083% nebulizer solution TAKE 3 MLS BY NEBULIZATION EVERY 6 (SIX) HOURS AS NEEDED FOR WHEEZING OR SHORTNESS OF BREATH.   albuterol (VENTOLIN HFA) 108 (90 Base) MCG/ACT inhaler TAKE 2 PUFFS BY MOUTH EVERY 6 HOURS AS NEEDED FOR WHEEZE OR SHORTNESS OF BREATH   Budesonide (PULMICORT FLEXHALER) 90 MCG/ACT inhaler INHALE 1 PUFF BY MOUTH TWICE A DAY   celecoxib (CELEBREX) 200 MG capsule Take 1 capsule (200 mg total) by mouth daily.   EPINEPHrine 0.3 mg/0.3 mL IJ SOAJ injection INJECT AS DIRECTED   L-Lysine 1000 MG TABS Take 2 tablets by mouth daily.   metFORMIN (GLUCOPHAGE-XR) 500 MG  24 hr tablet Take 1 tablet (500 mg total) by mouth daily with breakfast.   methylphenidate (METADATE CD) 30 MG CR capsule Take 1 capsule (30 mg total) by mouth every morning.   methylphenidate (METADATE CD) 30 MG CR capsule Take 1 capsule (30 mg total) by mouth every morning. Do not fill until July 23, 2022, Patient prefers CD only.  Do not fill with ER   methylphenidate (RITALIN) 5 MG tablet Take 1 tablet around noon  as needed for concentration later in the day.   montelukast (SINGULAIR) 10 MG tablet TAKE 1 TABLET BY MOUTH EVERYDAY AT BEDTIME   Omega-3 1000 MG CAPS Take 1,000 mg by mouth daily.   QUERCETIN PO Take 1 tablet by mouth daily.   Turmeric 500 MG TABS Take by mouth. Take 2-3 tablets by mouth daily   VITAMIN D PO 25 mcg. Take 1/2 tablet daily   zinc gluconate 50 MG tablet Take 25 mg by mouth daily.   No facility-administered encounter medications on file as of 06/13/2022.

## 2022-06-14 ENCOUNTER — Other Ambulatory Visit: Payer: BC Managed Care – PPO

## 2022-06-14 ENCOUNTER — Ambulatory Visit: Payer: BC Managed Care – PPO | Admitting: Family Medicine

## 2022-06-14 NOTE — Progress Notes (Signed)
No critical labs need to be addressed urgently. We will discuss labs in detail at upcoming office visit.   

## 2022-06-21 ENCOUNTER — Ambulatory Visit (INDEPENDENT_AMBULATORY_CARE_PROVIDER_SITE_OTHER): Payer: BC Managed Care – PPO | Admitting: Family Medicine

## 2022-06-21 ENCOUNTER — Encounter: Payer: BC Managed Care – PPO | Admitting: Family Medicine

## 2022-06-21 ENCOUNTER — Encounter: Payer: Self-pay | Admitting: Family Medicine

## 2022-06-21 VITALS — BP 100/76 | HR 72 | Temp 97.0°F | Ht 68.5 in | Wt 262.0 lb

## 2022-06-21 DIAGNOSIS — J4540 Moderate persistent asthma, uncomplicated: Secondary | ICD-10-CM | POA: Diagnosis not present

## 2022-06-21 DIAGNOSIS — F988 Other specified behavioral and emotional disorders with onset usually occurring in childhood and adolescence: Secondary | ICD-10-CM

## 2022-06-21 DIAGNOSIS — Z0000 Encounter for general adult medical examination without abnormal findings: Secondary | ICD-10-CM | POA: Diagnosis not present

## 2022-06-21 DIAGNOSIS — Z13220 Encounter for screening for lipoid disorders: Secondary | ICD-10-CM

## 2022-06-21 DIAGNOSIS — E7800 Pure hypercholesterolemia, unspecified: Secondary | ICD-10-CM

## 2022-06-21 DIAGNOSIS — E282 Polycystic ovarian syndrome: Secondary | ICD-10-CM

## 2022-06-21 NOTE — Assessment & Plan Note (Signed)
Chronic,  she feels significantly better now after starting metformin.

## 2022-06-21 NOTE — Progress Notes (Signed)
Patient ID: Brittany [name], female    DOB: 1975-06-01, 47 y.o.   MRN: [id number]  This visit was conducted in person.  BP 100/76   Pulse 72   Temp (!) 97 F (36.1 C) (Temporal)   Ht 5' 8.5" (1.74 m)   Wt 262 lb (118.8 kg)   LMP 06/13/2022   SpO2 98%   BMI 39.26 kg/m    CC:  Chief Complaint  Patient presents with   Annual Exam    Subjective:   HPI: Brittany [name] is a 47 y.o. female presenting on 06/21/2022 for Annual Exam The patient presents for annual medicare wellness, complete physical and review of chronic health problems. He/She also has the following acute concerns today:   Last appt with GYN 12/203   Plans to start celebrex for  Bilateral knee pain.Marland Kitchen per Dr. Lorelei Pont. 06/13/22   Reviewed last OV. Elevated Cholesterol:  Lab Results  Component Value Date   CHOL 190 06/13/2022   HDL 65.20 06/13/2022   LDLCALC 119 (H) 06/13/2022   TRIG 30.0 06/13/2022   CHOLHDL 3 06/13/2022  The 10-year ASCVD risk score (Arnett DK, et al., 2019) is: 0.2%   Values used to calculate the score:     Age: 16 years     Sex: Female     Is Non-Hispanic African American: Yes     Diabetic: No     Tobacco smoker: No     Systolic Blood Pressure: 123XX123 mmHg     Is BP treated: No     HDL Cholesterol: 65.2 mg/dL     Total Cholesterol: 190 mg/dL Using medications without problems: Muscle aches:  Diet compliance: healthy eating habits Exercise: none with  knee issue. Other complaints:  Obesity/PCOS: she has noted some improvement in weight loss with metformin xr 500 mg daily. NO N/D. Wt Readings from Last 3 Encounters:  06/21/22 262 lb (118.8 kg)  06/04/22 267 lb 8 oz (121.3 kg)  05/31/22 270 lb (122.5 kg)   Body mass index is 39.26 kg/m.  BP Readings from Last 3 Encounters:  06/21/22 100/76  06/13/22 100/60  06/04/22 120/80   ADD, well controlled on methylphenidate CD 30 with an additional 5 mg short acting later in the day if needed.     Relevant past medical, surgical,  family and social history reviewed and updated as indicated. Interim medical history since our last visit reviewed. Allergies and medications reviewed and updated. Outpatient Medications Prior to Visit  Medication Sig Dispense Refill   albuterol (PROVENTIL) (2.5 MG/3ML) 0.083% nebulizer solution TAKE 3 MLS BY NEBULIZATION EVERY 6 (SIX) HOURS AS NEEDED FOR WHEEZING OR SHORTNESS OF BREATH. 150 mL 1   albuterol (VENTOLIN HFA) 108 (90 Base) MCG/ACT inhaler TAKE 2 PUFFS BY MOUTH EVERY 6 HOURS AS NEEDED FOR WHEEZE OR SHORTNESS OF BREATH 18 each 2   Budesonide (PULMICORT FLEXHALER) 90 MCG/ACT inhaler INHALE 1 PUFF BY MOUTH TWICE A DAY 1 each 2   L-Lysine 1000 MG TABS Take 2 tablets by mouth daily.     metFORMIN (GLUCOPHAGE-XR) 500 MG 24 hr tablet Take 1 tablet (500 mg total) by mouth daily with breakfast. 30 tablet 3   methylphenidate (METADATE CD) 30 MG CR capsule Take 1 capsule (30 mg total) by mouth every morning. 30 capsule 0   montelukast (SINGULAIR) 10 MG tablet TAKE 1 TABLET BY MOUTH EVERYDAY AT BEDTIME 90 tablet 1   Omega-3 1000 MG CAPS Take 1,000 mg by mouth daily.  QUERCETIN PO Take 1 tablet by mouth daily.     Turmeric 500 MG TABS Take by mouth. Take 2-3 tablets by mouth daily     VITAMIN D PO 25 mcg. Take 1/2 tablet daily     zinc gluconate 50 MG tablet Take 25 mg by mouth daily.     EPINEPHrine 0.3 mg/0.3 mL IJ SOAJ injection INJECT AS DIRECTED 2 each 0   celecoxib (CELEBREX) 200 MG capsule Take 1 capsule (200 mg total) by mouth daily. (Patient not taking: Reported on 06/21/2022) 30 capsule 2   methylphenidate (METADATE CD) 30 MG CR capsule Take 1 capsule (30 mg total) by mouth every morning. Do not fill until July 23, 2022, Patient prefers CD only.  Do not fill with ER 30 capsule 0   methylphenidate (RITALIN) 5 MG tablet Take 1 tablet around noon  as needed for concentration later in the day. 30 tablet 0   No facility-administered medications prior to visit.     Per HPI unless  specifically indicated in ROS section below Review of Systems  Constitutional:  Negative for fatigue and fever.  HENT:  Negative for congestion.   Eyes:  Negative for pain.  Respiratory:  Negative for cough and shortness of breath.   Cardiovascular:  Negative for chest pain, palpitations and leg swelling.  Gastrointestinal:  Negative for abdominal pain.  Genitourinary:  Negative for dysuria and vaginal bleeding.  Musculoskeletal:  Negative for back pain.  Neurological:  Negative for syncope, light-headedness and headaches.  Psychiatric/Behavioral:  Negative for dysphoric mood.    Objective:  BP 100/76   Pulse 72   Temp (!) 97 F (36.1 C) (Temporal)   Ht 5' 8.5" (1.74 m)   Wt 262 lb (118.8 kg)   LMP 06/13/2022   SpO2 98%   BMI 39.26 kg/m   Wt Readings from Last 3 Encounters:  06/21/22 262 lb (118.8 kg)  06/04/22 267 lb 8 oz (121.3 kg)  05/31/22 270 lb (122.5 kg)      Physical Exam Vitals and nursing note reviewed.  Constitutional:      General: She is not in acute distress.    Appearance: Normal appearance. She is well-developed. She is obese. She is not ill-appearing or toxic-appearing.  HENT:     Head: Normocephalic.     Right Ear: Hearing, tympanic membrane, ear canal and external ear normal.     Left Ear: Hearing, tympanic membrane, ear canal and external ear normal.     Nose: Nose normal.  Eyes:     General: Lids are normal. Lids are everted, no foreign bodies appreciated.     Conjunctiva/sclera: Conjunctivae normal.     Pupils: Pupils are equal, round, and reactive to light.  Neck:     Thyroid: No thyroid mass or thyromegaly.     Vascular: No carotid bruit.     Trachea: Trachea normal.  Cardiovascular:     Rate and Rhythm: Normal rate and regular rhythm.     Heart sounds: Normal heart sounds, S1 normal and S2 normal. No murmur heard.    No gallop.  Pulmonary:     Effort: Pulmonary effort is normal. No respiratory distress.     Breath sounds: Normal breath  sounds. No wheezing, rhonchi or rales.  Abdominal:     General: Bowel sounds are normal. There is no distension or abdominal bruit.     Palpations: Abdomen is soft. There is no fluid wave or mass.     Tenderness: There is no abdominal  tenderness. There is no guarding or rebound.     Hernia: No hernia is present.  Musculoskeletal:     Cervical back: Normal range of motion and neck supple.  Lymphadenopathy:     Cervical: No cervical adenopathy.  Skin:    General: Skin is warm and dry.     Findings: No rash.  Neurological:     Mental Status: She is alert.     Cranial Nerves: No cranial nerve deficit.     Sensory: No sensory deficit.  Psychiatric:        Mood and Affect: Mood is not anxious or depressed.        Speech: Speech normal.        Behavior: Behavior normal. Behavior is cooperative.        Judgment: Judgment normal.       Results for orders placed or performed in visit on 06/13/22  Vitamin B12  Result Value Ref Range   Vitamin B-12 401 211 - 911 pg/mL  Lipid panel  Result Value Ref Range   Cholesterol 190 0 - 200 mg/dL   Triglycerides 30.0 0.0 - 149.0 mg/dL   HDL 65.20 >39.00 mg/dL   VLDL 6.0 0.0 - 40.0 mg/dL   LDL Cholesterol 119 (H) 0 - 99 mg/dL   Total CHOL/HDL Ratio 3    NonHDL 124.92   Comprehensive metabolic panel  Result Value Ref Range   Sodium 138 135 - 145 mEq/L   Potassium 3.9 3.5 - 5.1 mEq/L   Chloride 104 96 - 112 mEq/L   CO2 28 19 - 32 mEq/L   Glucose, Bld 78 70 - 99 mg/dL   BUN 10 6 - 23 mg/dL   Creatinine, Ser 0.97 0.40 - 1.20 mg/dL   Total Bilirubin 0.7 0.2 - 1.2 mg/dL   Alkaline Phosphatase 52 39 - 117 U/L   AST 23 0 - 37 U/L   ALT 13 0 - 35 U/L   Total Protein 7.4 6.0 - 8.3 g/dL   Albumin 4.1 3.5 - 5.2 g/dL   GFR 70.89 >60.00 mL/min   Calcium 9.2 8.4 - 10.5 mg/dL  CBC with Differential/Platelet  Result Value Ref Range   WBC 4.0 4.0 - 10.5 K/uL   RBC 4.57 3.87 - 5.11 Mil/uL   Hemoglobin 12.4 12.0 - 15.0 g/dL   HCT 38.4 36.0 - 46.0 %    MCV 84.0 78.0 - 100.0 fl   MCHC 32.4 30.0 - 36.0 g/dL   RDW 13.5 11.5 - 15.5 %   Platelets 283.0 150.0 - 400.0 K/uL   Neutrophils Relative % 48.2 43.0 - 77.0 %   Lymphocytes Relative 42.4 12.0 - 46.0 %   Monocytes Relative 6.5 3.0 - 12.0 %   Eosinophils Relative 1.7 0.0 - 5.0 %   Basophils Relative 1.2 0.0 - 3.0 %   Neutro Abs 1.9 1.4 - 7.7 K/uL   Lymphs Abs 1.7 0.7 - 4.0 K/uL   Monocytes Absolute 0.3 0.1 - 1.0 K/uL   Eosinophils Absolute 0.1 0.0 - 0.7 K/uL   Basophils Absolute 0.0 0.0 - 0.1 K/uL    This visit occurred during the SARS-CoV-2 public health emergency.  Safety protocols were in place, including screening questions prior to the visit, additional usage of staff PPE, and extensive cleaning of exam room while observing appropriate contact time as indicated for disinfecting solutions.   COVID 19 screen:  No recent travel or known exposure to COVID19 The patient denies respiratory symptoms of COVID 19 at this time.  The importance of social distancing was discussed today.   Assessment and Plan The patient's preventative maintenance and recommended screening tests for an annual wellness exam were reviewed in full today. Brought up to date unless services declined.  Counselled on the importance of diet, exercise, and its role in overall health and mortality. The patient's FH and SH was reviewed, including their home life, tobacco status, and drug and alcohol status.   Vaccines:  COVID x 4, past flu season,  Tdap 2023 Pap/DVE:  GYN 09/2018 Mammo: no early family history of breast cancer, 01/2021 neg.Marland Kitchen  Per GYN.Marland Kitchen requested. Colon: no early family history of colon cancer,  Dr Silverio Decamp 09/2021, repeat in 10 years. Smoking Status:none ETOH/ drug use  occ/none nonsmoker  HIV screen:   refused   Problem List Items Addressed This Visit     ADD (attention deficit disorder)    Chronic, Well-controlled with Metadate CD30 milligrams in the morning with additional dose of short  acting 5 mg methylphenidate later in the day.  She states she prefers the CD form of 30 mg Metadate compared to the ER form.  No red flags, PDMP reviewed at this office visit. Will send in prescriptions for 3 months worth of Metadate CD30 milligrams daily with 1 prescription of methylphenidate short acting 5 mg to use if needed later in the day if she has a long workday.      Relevant Medications   methylphenidate (METADATE CD) 30 MG CR capsule   methylphenidate (RITALIN) 5 MG tablet   Moderate persistent asthma in adult without complication    No flares, using  QVAR daily,  albuterol prn rarely.       PCOS (polycystic ovarian syndrome)    Chronic,  she feels significantly better now after starting metformin.         Pure hypercholesterolemia    Chronic, well-controlled  ASCVD 10-year risk 0.2%       Severe obesity (BMI 35.0-39.9) with comorbidity    Chronic, she has been working aggressively on dietary changes.  She is limited with activity given knee issues. Continue metformin XR 500 mg p.o. daily      Relevant Medications   methylphenidate (METADATE CD) 30 MG CR capsule   methylphenidate (RITALIN) 5 MG tablet   methylphenidate (RITALIN) 5 MG tablet   methylphenidate (RITALIN) 5 MG tablet   Other Visit Diagnoses     Routine general medical examination at a health care facility    -  Primary   Screening cholesterol level       Relevant Orders   Lipid panel   Comprehensive metabolic panel      Meds ordered this encounter  Medications   methylphenidate (METADATE CD) 30 MG CR capsule    Sig: Take 1 capsule (30 mg total) by mouth every morning. Patient prefers CD only.  Do not fill with ER    Dispense:  30 capsule    Refill:  0    DO NOT FILL UNTIL 08/22/2022   methylphenidate (RITALIN) 5 MG tablet    Sig: Take 1 tablet around noon  as needed for concentration later in the day.    Dispense:  30 tablet    Refill:  0    DO NOT FILL UNTIL 08/22/2022    methylphenidate (RITALIN) 5 MG tablet    Sig: Take 1 tablet around noon as needed for concentration later in the day    Dispense:  30 tablet    Refill:  0  DO NOT FILL UNTIL 07/23/2022   methylphenidate (RITALIN) 5 MG tablet    Sig: Take 1 tablet around noon as needed for concentration later in the day    Dispense:  30 tablet    Refill:  0    DO NOT FILL UNTIL 06/22/2022    Eliezer Lofts, MD

## 2022-06-21 NOTE — Assessment & Plan Note (Signed)
Chronic, she has been working aggressively on dietary changes.  She is limited with activity given knee issues. Continue metformin XR 500 mg p.o. daily

## 2022-06-21 NOTE — Assessment & Plan Note (Signed)
No flares, using  QVAR daily,  albuterol prn rarely.  

## 2022-06-21 NOTE — Assessment & Plan Note (Signed)
Chronic, Well-controlled with Metadate CD30 milligrams in the morning with additional dose of short acting 5 mg methylphenidate later in the day.  She states she prefers the CD form of 30 mg Metadate compared to the ER form.  No red flags, PDMP reviewed at this office visit. Will send in prescriptions for 3 months worth of Metadate CD30 milligrams daily with 1 prescription of methylphenidate short acting 5 mg to use if needed later in the day if she has a long workday. 

## 2022-06-21 NOTE — Assessment & Plan Note (Signed)
Chronic, well-controlled  ASCVD 10-year risk 0.2%

## 2022-06-25 ENCOUNTER — Other Ambulatory Visit: Payer: Self-pay | Admitting: Family Medicine

## 2022-06-25 DIAGNOSIS — F988 Other specified behavioral and emotional disorders with onset usually occurring in childhood and adolescence: Secondary | ICD-10-CM

## 2022-06-25 MED ORDER — EPINEPHRINE 0.3 MG/0.3ML IJ SOAJ: INTRAMUSCULAR | 0 refills | Status: DC

## 2022-06-28 MED ORDER — METHYLPHENIDATE HCL ER (CD) 30 MG PO CPCR: 30.0000 mg | ORAL_CAPSULE | ORAL | 0 refills | Status: DC

## 2022-06-28 MED ORDER — METHYLPHENIDATE HCL 5 MG PO TABS: ORAL_TABLET | ORAL | 0 refills | Status: DC

## 2022-07-25 ENCOUNTER — Other Ambulatory Visit: Payer: Self-pay | Admitting: Family Medicine

## 2022-07-25 DIAGNOSIS — F988 Other specified behavioral and emotional disorders with onset usually occurring in childhood and adolescence: Secondary | ICD-10-CM

## 2022-07-30 ENCOUNTER — Encounter: Payer: Self-pay | Admitting: Family Medicine

## 2022-07-31 NOTE — Telephone Encounter (Signed)
Please call this patients pharmacy to figure out what the issue is and how to correct it! Thanks!

## 2022-08-26 ENCOUNTER — Other Ambulatory Visit: Payer: Self-pay | Admitting: Family Medicine

## 2022-09-04 ENCOUNTER — Encounter: Payer: Self-pay | Admitting: Family Medicine

## 2022-09-11 MED ORDER — METHYLPHENIDATE HCL 5 MG PO TABS: ORAL_TABLET | ORAL | 0 refills | Status: DC

## 2022-09-26 ENCOUNTER — Other Ambulatory Visit: Payer: Self-pay | Admitting: Family Medicine

## 2022-09-26 DIAGNOSIS — F988 Other specified behavioral and emotional disorders with onset usually occurring in childhood and adolescence: Secondary | ICD-10-CM

## 2022-09-26 MED ORDER — METHYLPHENIDATE HCL ER (CD) 30 MG PO CPCR: 30.0000 mg | ORAL_CAPSULE | ORAL | 0 refills | Status: DC

## 2022-09-26 NOTE — Telephone Encounter (Signed)
Last written 06-28-22 for fill after 08-22-22 #30 Last OV/CPE 06-21-22 No Future OV  [facility]

## 2022-10-20 ENCOUNTER — Other Ambulatory Visit: Payer: Self-pay | Admitting: Family Medicine

## 2022-10-25 ENCOUNTER — Other Ambulatory Visit: Payer: Self-pay | Admitting: Family Medicine

## 2022-10-25 NOTE — Telephone Encounter (Signed)
Last office visit 06/21/2022 for CPE.  Last refilled 09/11/2022 for #60 with no refills.  Next Appt: No future appointments with PCP.

## 2022-10-26 MED ORDER — METHYLPHENIDATE HCL 5 MG PO TABS: ORAL_TABLET | ORAL | 0 refills | Status: DC

## 2022-10-26 NOTE — Telephone Encounter (Signed)
Please call and schedule:  follow up ADD with Dr. Ermalene Searing with fasting labs prior:  Return in about 6 months (around 12/22/2022) for  ADD followup,  can do labs prior monitoring kindey function on NSAIDs and cholesterol.

## 2022-10-26 NOTE — Telephone Encounter (Signed)
Lvm to schedule

## 2022-10-30 ENCOUNTER — Encounter: Payer: Self-pay | Admitting: Family Medicine

## 2022-10-30 ENCOUNTER — Ambulatory Visit: Payer: BC Managed Care – PPO | Admitting: Family Medicine

## 2022-10-30 VITALS — BP 122/80 | HR 72 | Temp 97.2°F | Ht 68.5 in

## 2022-10-30 DIAGNOSIS — E162 Hypoglycemia, unspecified: Secondary | ICD-10-CM

## 2022-10-30 DIAGNOSIS — K900 Celiac disease: Secondary | ICD-10-CM | POA: Diagnosis not present

## 2022-10-30 DIAGNOSIS — R42 Dizziness and giddiness: Secondary | ICD-10-CM | POA: Diagnosis not present

## 2022-10-30 LAB — GLUCOSE, POCT (MANUAL RESULT ENTRY)
POC Glucose: 7.5 mg/dl — AB (ref 70–99)
POC Glucose: 97 mg/dl (ref 70–99)

## 2022-10-30 MED ORDER — GVOKE HYPOPEN 2-PACK 1 MG/0.2ML ~~LOC~~ SOAJ: SUBCUTANEOUS | 1 refills | Status: DC

## 2022-10-30 NOTE — Progress Notes (Unsigned)
Ph: [phone] Fax: [phone]   Patient ID: Brittany [name], female    DOB: 1975/03/29, 47 y.o.   MRN: [id number]  This visit was conducted in person.  BP 122/80   Pulse 72   Temp (!) 97.2 F (36.2 C) (Temporal)   Ht 5' 8.5" (1.74 m)   LMP 10/02/2022   SpO2 98%   BMI 39.26 kg/m   BP Readings from Last 3 Encounters:  10/30/22 122/80  06/21/22 100/76  06/13/22 100/60    CC: discuss low blood sugars Subjective:   HPI: Brittany [name] is a 47 y.o. female presenting on 10/30/2022 for Low Blood Sugar (C/o recent BS readings- in the 60s. Started last few days. )   Weeks ago having episodes of feeling off - faint feeling in head, lightheaded imbalance.  No headache, head pressure, vision changes, tinnitus, hearing changes, double vision, ST, fevers/chills. No significant fatigue, no skin or hair changes. No vertigo or significant dizziness, no palpitations, dyspnea.  Notes increased night sweats - sleeps with heater because she stays cold. New constipation.  H/o celiac disease.  No fmhx thyroid disease.  No low temperature or low blood pressure readings.   Seeming to become more frequent.  Had an episode 2-3 wks ago - while driving. This was scary.   She has started monitoring sugars - and this week noted cbg down to 66 when she was having episode. Has had several hypoglycemic episodes in interim. Latest this morning cbg down to 64 at 10:10am despite eating prior to this - she had greek yogurt, 1/2 smoothie, nuts for breakfast.  Nadir cbg has been 64 No significant hypoglycemic awareness besides malaise and above symptoms.  Denies significant alcohol intake.   She had been doing intermittent fasting - but has since stopped. She is metformin XR 500mg  daily for PCOS through PCP. Stopped metformin 1 wk ago.  Lab Results  Component Value Date   HGBA1C 4.6 02/06/2022  Strong fmhx diabetes.  She regularly sees nutritionist.   Almost 50 lb weight gain over the past year since  starting new OCP for ovarian cysts and adenomyosis. Trouble losing weight since then.      Relevant past medical, surgical, family and social history reviewed and updated as indicated. Interim medical history since our last visit reviewed. Allergies and medications reviewed and updated. Outpatient Medications Prior to Visit  Medication Sig Dispense Refill   albuterol (PROVENTIL) (2.5 MG/3ML) 0.083% nebulizer solution TAKE 3 MLS BY NEBULIZATION EVERY 6 (SIX) HOURS AS NEEDED FOR WHEEZING OR SHORTNESS OF BREATH. 150 mL 1   albuterol (VENTOLIN HFA) 108 (90 Base) MCG/ACT inhaler TAKE 2 PUFFS BY MOUTH EVERY 6 HOURS AS NEEDED FOR WHEEZE OR SHORTNESS OF BREATH 18 each 2   Budesonide (PULMICORT FLEXHALER) 90 MCG/ACT inhaler INHALE 1 PUFF BY MOUTH TWICE A DAY 1 each 2   celecoxib (CELEBREX) 200 MG capsule Take 1 capsule (200 mg total) by mouth daily. 30 capsule 2   EPINEPHRINE 0.3 mg/0.3 mL IJ SOAJ injection INJECT AS DIRECTED 2 each 0   L-Lysine 1000 MG TABS Take 2 tablets by mouth daily.     methylphenidate (METADATE CD) 30 MG CR capsule Take 1 capsule (30 mg total) by mouth every morning. 30 capsule 0   methylphenidate (METADATE CD) 30 MG CR capsule Take 1 capsule (30 mg total) by mouth every morning. Patient prefers CD only.  Do not fill with ER 30 capsule 0   methylphenidate (RITALIN) 5 MG tablet Take 1  tablet around noon  as needed for concentration later in the day. 30 tablet 0   methylphenidate (RITALIN) 5 MG tablet Take 1 tablet around noon as needed for concentration later in the day 30 tablet 0   methylphenidate (RITALIN) 5 MG tablet Take 1-2 tablet around noon as needed for concentration later in the day 60 tablet 0   montelukast (SINGULAIR) 10 MG tablet TAKE 1 TABLET BY MOUTH EVERYDAY AT BEDTIME 90 tablet 1   Omega-3 1000 MG CAPS Take 1,000 mg by mouth daily.     QUERCETIN PO Take 1 tablet by mouth daily.     Turmeric 500 MG TABS Take by mouth. Take 2-3 tablets by mouth daily     VITAMIN D  PO 25 mcg. Take 1/2 tablet daily     zinc gluconate 50 MG tablet Take 25 mg by mouth daily.     metFORMIN (GLUCOPHAGE-XR) 500 MG 24 hr tablet TAKE 1 TABLET BY MOUTH EVERY DAY WITH BREAKFAST 90 tablet 1   No facility-administered medications prior to visit.     Per HPI unless specifically indicated in ROS section below Review of Systems  Objective:  BP 122/80   Pulse 72   Temp (!) 97.2 F (36.2 C) (Temporal)   Ht 5' 8.5" (1.74 m)   LMP 10/02/2022   SpO2 98%   BMI 39.26 kg/m   Wt Readings from Last 3 Encounters:  06/21/22 262 lb (118.8 kg)  06/04/22 267 lb 8 oz (121.3 kg)  05/31/22 270 lb (122.5 kg)      Physical Exam Vitals and nursing note reviewed.  Constitutional:      Appearance: Normal appearance. She is not ill-appearing.  HENT:     Head: Normocephalic and atraumatic.     Mouth/Throat:     Mouth: Mucous membranes are moist.     Pharynx: Oropharynx is clear. No oropharyngeal exudate or posterior oropharyngeal erythema.  Eyes:     General:        Right eye: No discharge.        Left eye: No discharge.     Extraocular Movements: Extraocular movements intact.     Pupils: Pupils are equal, round, and reactive to light.  Neck:     Thyroid: No thyroid mass or thyromegaly.  Cardiovascular:     Rate and Rhythm: Normal rate and regular rhythm.     Pulses: Normal pulses.     Heart sounds: Normal heart sounds. No murmur heard. Pulmonary:     Effort: Pulmonary effort is normal. No respiratory distress.     Breath sounds: Normal breath sounds. No wheezing, rhonchi or rales.  Musculoskeletal:     Cervical back: Normal range of motion and neck supple.     Right lower leg: No edema.     Left lower leg: No edema.  Skin:    General: Skin is warm and dry.     Findings: No rash.  Neurological:     General: No focal deficit present.     Mental Status: She is alert.     Comments:  CN 2-12 intact FTN intact EOMI  Psychiatric:        Mood and Affect: Mood normal.         Behavior: Behavior normal.       Results for orders placed or performed in visit on 10/30/22  Insulin and C-Peptide  Result Value Ref Range   INSULIN 25.5 (H) 2.6 - 24.9 uIU/mL   C-Peptide 3.5 1.1 - 4.4 ng/mL  POCT glucose (manual entry)  Result Value Ref Range   POC Glucose 75 (A) 70 - 99 mg/dl  POCT glucose (manual entry)  Result Value Ref Range   POC Glucose 97 70 - 99 mg/dl   Lab Results  Component Value Date   TSH 1.76 11/02/2016   Assessment & Plan:   Problem List Items Addressed This Visit     Dizziness   Celiac disease   Hypoglycemia - Primary    Recurrent episodes of malaise and lightheaded faint feeling associated with documented low sugars 60s over the past few weeks, episodes may be accelerating.  She has stopped metformin XR 1 wk ago - agree with staying off this medication. Discussed long acting effects of medication so will take several weeks for effect to wear off.  Will start evaluation for hypoglycemia with labwork including A1c, insulin levels, C-peptide, LFTs, and beta-hydroxybutyrin. Will also check TSH, CBC, cortisol.  Provided with Gvoke pen, for emergency unresponsive hypoglycemia <50.  Provided with Freestyle Libre 3 sample for continuous glucose monitor for next 2 weeks.  I did recommend she follow up with PCP in 1-2 wks.  Non-focal neurological exam, no other significant symptoms at this time.       Relevant Orders   POCT glucose (manual entry) (Completed)   TSH   CBC with Differential/Platelet   Basic metabolic panel   Insulin and C-Peptide (Completed)   Cortisol   POCT glucose (manual entry) (Completed)   Hepatic function panel   Beta-Hydroxybutyrate     Meds ordered this encounter  Medications   Glucagon (GVOKE HYPOPEN 2-PACK) 1 MG/0.2ML SOAJ    Sig: Use as directed injection into the skin    Dispense:  2 mL    Refill:  1   glucose blood (ACCU-CHEK GUIDE) test strip    Sig: Use as instructed for hypoglycemia E16.2    Dispense:  100  each    Refill:  3    Orders Placed This Encounter  Procedures   TSH   CBC with Differential/Platelet   Basic metabolic panel   Insulin and C-Peptide   Cortisol   Hepatic function panel   Beta-Hydroxybutyrate   POCT glucose (manual entry)   POCT glucose (manual entry)    Patient Instructions  Labs today  Stay off metformin.  Sent home with continuous glucose monitor to wear for the next 2 weeks while we await labwork results.   The 15-15 rule for low sugars: If sugar reading below 70, have 15 grams of carbohydrate to raise your blood sugar and check it after 15 minutes. If it's still below 70 mg/dL, have another serving. 15 grams of carbs may be: -Glucose tablets (see instructions)  -Gel tube (see instructions) -4 ounces (1/2 cup) of juice or regular soda (not diet) -1 tablespoon of sugar, honey, or corn syrup -Hard candies, jellybeans or gumdrops--see food label for how many to consume  Repeat these steps until your blood sugar is at least 70 mg/dL. Once your blood sugar is back to normal, eat a meal or snack to make sure it doesn't lower again.    May use Gvoke pen if sugar staying low.   Follow up plan: No follow-ups on file.  Eustaquio Boyden, MD

## 2022-10-30 NOTE — Patient Instructions (Addendum)
Labs today  Stay off metformin.  Sent home with continuous glucose monitor to wear for the next 2 weeks while we await labwork results.   The 15-15 rule for low sugars: If sugar reading below 70, have 15 grams of carbohydrate to raise your blood sugar and check it after 15 minutes. If it's still below 70 mg/dL, have another serving. 15 grams of carbs may be: -Glucose tablets (see instructions)  -Gel tube (see instructions) -4 ounces (1/2 cup) of juice or regular soda (not diet) -1 tablespoon of sugar, honey, or corn syrup -Hard candies, jellybeans or gumdrops--see food label for how many to consume  Repeat these steps until your blood sugar is at least 70 mg/dL. Once your blood sugar is back to normal, eat a meal or snack to make sure it doesn't lower again.    May use Gvoke pen if sugar staying low.

## 2022-10-31 ENCOUNTER — Encounter: Payer: Self-pay | Admitting: Family Medicine

## 2022-10-31 ENCOUNTER — Other Ambulatory Visit: Payer: Self-pay | Admitting: Family Medicine

## 2022-10-31 LAB — HEPATIC FUNCTION PANEL
ALT: 12 U/L (ref 0–35)
AST: 20 U/L (ref 0–37)
Albumin: 4.3 g/dL (ref 3.5–5.2)
Alkaline Phosphatase: 45 U/L (ref 39–117)
Bilirubin, Direct: 0.1 mg/dL (ref 0.0–0.3)
Total Bilirubin: 0.4 mg/dL (ref 0.2–1.2)
Total Protein: 7.2 g/dL (ref 6.0–8.3)

## 2022-10-31 MED ORDER — ACCU-CHEK GUIDE VI STRP: ORAL_STRIP | 3 refills | Status: DC

## 2022-10-31 NOTE — Assessment & Plan Note (Addendum)
Recurrent episodes of malaise and lightheaded faint feeling associated with documented low sugars 60s over the past few weeks, episodes may be accelerating.  She has stopped metformin XR 1 wk ago - agree with staying off this medication. Discussed long acting effects of medication so will take several weeks for effect to wear off.  Will start evaluation for hypoglycemia with labwork including A1c, insulin levels, C-peptide, LFTs, and beta-hydroxybutyrin. Will also check TSH, CBC, cortisol.  Provided with Gvoke pen, for emergency unresponsive hypoglycemia <50.  Provided with Freestyle Libre 3 sample for continuous glucose monitor for next 2 weeks.  I did recommend she follow up with PCP in 1-2 wks.  Non-focal neurological exam, no other significant symptoms at this time.

## 2022-10-31 NOTE — Telephone Encounter (Signed)
Message from pharmacy:      Alternative Requested:NOT COVERED UNDER INSURANCE.

## 2022-10-31 NOTE — Telephone Encounter (Signed)
Shaheen has an appointment scheduled with Dr. Ermalene Searing 11/01/22 at 3:00 pm.

## 2022-11-01 ENCOUNTER — Encounter: Payer: Self-pay | Admitting: Family Medicine

## 2022-11-01 ENCOUNTER — Ambulatory Visit: Payer: BC Managed Care – PPO | Admitting: Family Medicine

## 2022-11-01 VITALS — BP 116/66 | HR 85 | Temp 97.4°F | Ht 68.5 in

## 2022-11-01 DIAGNOSIS — E162 Hypoglycemia, unspecified: Secondary | ICD-10-CM | POA: Diagnosis not present

## 2022-11-01 NOTE — Progress Notes (Signed)
Patient ID: Brittany [name], female    DOB: 1976-02-03, 47 y.o.   MRN: [id number]  This visit was conducted in person.  BP 116/66   Pulse 85   Temp (!) 97.4 F (36.3 C) (Temporal)   Ht 5' 8.5" (1.74 m)   LMP 10/02/2022   SpO2 99%   BMI 39.26 kg/m    CC:  Chief Complaint  Patient presents with   Medical Management of Chronic Issues    Here for hypoglycemia f/u. Recently seen by Dr. Sharen Hones and had labs done.     Subjective:   HPI: Brittany [name] is a 47 y.o. female presenting on 11/01/2022 for Medical Management of Chronic Issues (Here for hypoglycemia f/u. Recently seen by Dr. Sharen Hones and had labs done. )   Reviewed recent office visit from October 30, 2022 with Dr. Sharen Hones for hypoglycemia. Associated with recurrent malaise and lightheadedness... she has had off and on for the last year.  She had noted intermittent low blood sugars down to 64. occured Had previously been doing intermittent fasting as well as metformin XR 500 mg daily for PCOS.  She has since stopped metformin 1 week ago. Dr. Reece Agar provided Gvoke E pen glucagon for emergency unresponsiveness with low hypoglycemia less than 50. Also given freestyle libre continuous glucose monitor to use for the next 2 weeks.  Point-of-care glucose was  initially 75, increased to 97 after apple juice Normal hepatic function. Plasma cortisol normal, insulin  elevated but C-peptide normal, associated glucose 94 ( had been drinking loads of juice on way to appt and prior to lab) Normal thyroid, CBC and basic metabolic panel. Beta hydroxybutyrate still  pending   Blood sugar has been more normal in last 2 days.. She has been eating more regular meal, more protein/fiber. Eating chicken protein, eggs... she  is not restricting.   80-90  levels... occ 100 after eating.  Still with some lightheadedness.  Relevant past medical, surgical, family and social history reviewed and updated as indicated. Interim medical history since our  last visit reviewed. Allergies and medications reviewed and updated. Outpatient Medications Prior to Visit  Medication Sig Dispense Refill   albuterol (PROVENTIL) (2.5 MG/3ML) 0.083% nebulizer solution TAKE 3 MLS BY NEBULIZATION EVERY 6 (SIX) HOURS AS NEEDED FOR WHEEZING OR SHORTNESS OF BREATH. 150 mL 1   albuterol (VENTOLIN HFA) 108 (90 Base) MCG/ACT inhaler TAKE 2 PUFFS BY MOUTH EVERY 6 HOURS AS NEEDED FOR WHEEZE OR SHORTNESS OF BREATH 18 each 2   Budesonide (PULMICORT FLEXHALER) 90 MCG/ACT inhaler INHALE 1 PUFF BY MOUTH TWICE A DAY 1 each 2   celecoxib (CELEBREX) 200 MG capsule Take 1 capsule (200 mg total) by mouth daily. 30 capsule 2   EPINEPHRINE 0.3 mg/0.3 mL IJ SOAJ injection INJECT AS DIRECTED 2 each 0   Glucagon (GVOKE HYPOPEN 2-PACK) 1 MG/0.2ML SOAJ Use as directed injection into the skin 2 mL 1   glucose blood (ACCU-CHEK GUIDE) test strip Use as instructed for hypoglycemia E16.2 100 each 3   L-Lysine 1000 MG TABS Take 2 tablets by mouth daily.     methylphenidate (METADATE CD) 30 MG CR capsule Take 1 capsule (30 mg total) by mouth every morning. 30 capsule 0   methylphenidate (METADATE CD) 30 MG CR capsule Take 1 capsule (30 mg total) by mouth every morning. Patient prefers CD only.  Do not fill with ER 30 capsule 0   methylphenidate (RITALIN) 5 MG tablet Take 1 tablet around noon  as  needed for concentration later in the day. 30 tablet 0   methylphenidate (RITALIN) 5 MG tablet Take 1 tablet around noon as needed for concentration later in the day 30 tablet 0   methylphenidate (RITALIN) 5 MG tablet Take 1-2 tablet around noon as needed for concentration later in the day 60 tablet 0   montelukast (SINGULAIR) 10 MG tablet TAKE 1 TABLET BY MOUTH EVERYDAY AT BEDTIME 90 tablet 1   Omega-3 1000 MG CAPS Take 1,000 mg by mouth daily.     QUERCETIN PO Take 1 tablet by mouth daily.     Turmeric 500 MG TABS Take by mouth. Take 2-3 tablets by mouth daily     VITAMIN D PO 25 mcg. Take 1/2 tablet  daily     zinc gluconate 50 MG tablet Take 25 mg by mouth daily.     No facility-administered medications prior to visit.     Per HPI unless specifically indicated in ROS section below Review of Systems  Constitutional:  Negative for fatigue and fever.  HENT:  Negative for congestion.   Eyes:  Negative for pain.  Respiratory:  Negative for cough and shortness of breath.   Cardiovascular:  Negative for chest pain, palpitations and leg swelling.  Gastrointestinal:  Negative for abdominal pain.  Genitourinary:  Negative for dysuria and vaginal bleeding.  Musculoskeletal:  Negative for back pain.  Neurological:  Positive for weakness and light-headedness. Negative for syncope and headaches.  Psychiatric/Behavioral:  Negative for dysphoric mood.    Objective:  BP 116/66   Pulse 85   Temp (!) 97.4 F (36.3 C) (Temporal)   Ht 5' 8.5" (1.74 m)   LMP 10/02/2022   SpO2 99%   BMI 39.26 kg/m   Wt Readings from Last 3 Encounters:  06/21/22 262 lb (118.8 kg)  06/04/22 267 lb 8 oz (121.3 kg)  05/31/22 270 lb (122.5 kg)      Physical Exam Constitutional:      Appearance: Normal appearance. She is well-developed. She is obese. She is not ill-appearing or toxic-appearing.  HENT:     Head: Normocephalic.     Right Ear: Hearing, tympanic membrane, ear canal and external ear normal. Tympanic membrane is not erythematous, retracted or bulging.     Left Ear: Hearing, tympanic membrane, ear canal and external ear normal. Tympanic membrane is not erythematous, retracted or bulging.     Nose: No mucosal edema or rhinorrhea.     Right Sinus: No maxillary sinus tenderness or frontal sinus tenderness.     Left Sinus: No maxillary sinus tenderness or frontal sinus tenderness.     Mouth/Throat:     Mouth: Oropharynx is clear and moist and mucous membranes are normal.     Pharynx: Uvula midline.  Eyes:     Extraocular Movements: EOM normal.     Pupils: Pupils are equal, round, and reactive to  light.  Cardiovascular:     Rate and Rhythm: Normal rate and regular rhythm.     Pulses: Normal pulses.     Heart sounds: Normal heart sounds, S1 normal and S2 normal. No murmur heard.    No friction rub. No gallop.  Pulmonary:     Effort: Pulmonary effort is normal. No tachypnea or respiratory distress.     Breath sounds: Normal breath sounds. No decreased breath sounds, wheezing, rhonchi or rales.  Skin:    General: Skin is warm, dry and intact.     Findings: No rash.  Neurological:     Mental  Status: She is alert.  Psychiatric:        Mood and Affect: Mood is not anxious or depressed.        Speech: Speech normal.        Behavior: Behavior normal. Behavior is cooperative.        Thought Content: Thought content normal.        Cognition and Memory: Cognition and memory normal.        Judgment: Judgment normal.       Results for orders placed or performed in visit on 10/30/22  TSH  Result Value Ref Range   TSH 1.51 0.35 - 5.50 uIU/mL  CBC with Differential/Platelet  Result Value Ref Range   WBC 4.9 4.0 - 10.5 K/uL   RBC 4.46 3.87 - 5.11 Mil/uL   Hemoglobin 12.2 12.0 - 15.0 g/dL   HCT 09.8 11.9 - 14.7 %   MCV 87.5 78.0 - 100.0 fl   MCHC 31.2 30.0 - 36.0 g/dL   RDW 82.9 56.2 - 13.0 %   Platelets 293.0 150.0 - 400.0 K/uL   Neutrophils Relative % 51.8 43.0 - 77.0 %   Lymphocytes Relative 38.7 12.0 - 46.0 %   Monocytes Relative 6.2 3.0 - 12.0 %   Eosinophils Relative 2.0 0.0 - 5.0 %   Basophils Relative 1.3 0.0 - 3.0 %   Neutro Abs 2.5 1.4 - 7.7 K/uL   Lymphs Abs 1.9 0.7 - 4.0 K/uL   Monocytes Absolute 0.3 0.1 - 1.0 K/uL   Eosinophils Absolute 0.1 0.0 - 0.7 K/uL   Basophils Absolute 0.1 0.0 - 0.1 K/uL  Basic metabolic panel  Result Value Ref Range   Sodium 137 135 - 145 mEq/L   Potassium 4.5 3.5 - 5.1 mEq/L   Chloride 102 96 - 112 mEq/L   CO2 26 19 - 32 mEq/L   Glucose, Bld 94 70 - 99 mg/dL   BUN 16 6 - 23 mg/dL   Creatinine, Ser 8.65 0.40 - 1.20 mg/dL   GFR 78.46  >96.29 mL/min   Calcium 9.1 8.4 - 10.5 mg/dL  Insulin and C-Peptide  Result Value Ref Range   INSULIN 25.5 (H) 2.6 - 24.9 uIU/mL   C-Peptide 3.5 1.1 - 4.4 ng/mL  Cortisol  Result Value Ref Range   Cortisol, Plasma 5.0 ug/dL  Hepatic function panel  Result Value Ref Range   Total Bilirubin 0.4 0.2 - 1.2 mg/dL   Bilirubin, Direct 0.1 0.0 - 0.3 mg/dL   Alkaline Phosphatase 45 39 - 117 U/L   AST 20 0 - 37 U/L   ALT 12 0 - 35 U/L   Total Protein 7.2 6.0 - 8.3 g/dL   Albumin 4.3 3.5 - 5.2 g/dL  POCT glucose (manual entry)  Result Value Ref Range   POC Glucose 75 (A) 70 - 99 mg/dl  POCT glucose (manual entry)  Result Value Ref Range   POC Glucose 97 70 - 99 mg/dl    Assessment and Plan  There are no diagnoses linked to this encounter.  No follow-ups on file.   Kerby Nora, MD

## 2022-11-01 NOTE — Assessment & Plan Note (Signed)
Acute Not clearly always associated with spells of lightheadedness as she continues to have that although blood sugar has not dropped below 90 in the last few days with keeping up with regular diet and high-protein foods.  Hypoglycemia may simply be due to hyper restriction fasting and metformin still in her system.  BMB remains pending.  C-peptide is normal suggesting against diabetes type 1.  Her insulin is elevated but this test was not done fasting so potentially could be related to the high level of juice and sugar she had prior to the test.  She will return for insulin repeat level fasting at least 12 hours.  I will discuss with Dr. Sharen Hones his thoughts about the test results as well. Normal liver function.  She has not yet gotten freestyle libre 2 or correctly with her phone.  She is going to work on this with her daughter or call the helpline tonight.  Agree continuous monitoring of blood sugar likely helpful.  Will move forward with endocrinology referral as this will likely take a while for her to get an appointment and may be needed if symptoms persist.

## 2022-11-02 ENCOUNTER — Other Ambulatory Visit: Payer: Self-pay | Admitting: Family Medicine

## 2022-11-02 ENCOUNTER — Other Ambulatory Visit (INDEPENDENT_AMBULATORY_CARE_PROVIDER_SITE_OTHER): Payer: BC Managed Care – PPO

## 2022-11-02 ENCOUNTER — Encounter: Payer: Self-pay | Admitting: *Deleted

## 2022-11-02 ENCOUNTER — Telehealth: Payer: Self-pay

## 2022-11-02 ENCOUNTER — Other Ambulatory Visit: Payer: BC Managed Care – PPO

## 2022-11-02 DIAGNOSIS — Z111 Encounter for screening for respiratory tuberculosis: Secondary | ICD-10-CM

## 2022-11-02 DIAGNOSIS — E162 Hypoglycemia, unspecified: Secondary | ICD-10-CM | POA: Diagnosis not present

## 2022-11-02 DIAGNOSIS — F988 Other specified behavioral and emotional disorders with onset usually occurring in childhood and adolescence: Secondary | ICD-10-CM

## 2022-11-02 DIAGNOSIS — R42 Dizziness and giddiness: Secondary | ICD-10-CM

## 2022-11-02 MED ORDER — METHYLPHENIDATE HCL ER (CD) 30 MG PO CPCR: 30.0000 mg | ORAL_CAPSULE | ORAL | 0 refills | Status: DC

## 2022-11-02 NOTE — Telephone Encounter (Signed)
Last office visit 11/01/2022 for hypoglycemia.  Last refilled 09/26/22 for #30 with no refills.  Next appt: No future appointments with PCP.

## 2022-11-02 NOTE — Telephone Encounter (Signed)
Brittany [name] is currently in the lab getting the Quantiferron Gold drawn.  Patient as to come back in given it has to be drawn in a green top tube.

## 2022-11-02 NOTE — Addendum Note (Signed)
Addended by: Kerby Nora E on: 11/02/2022 02:06 PM   Modules accepted: Orders

## 2022-11-02 NOTE — Telephone Encounter (Signed)
Spoke with Loraine Leriche at [facility] notifying him Dr. Reece Agar wants to c/x rx. Verbalizes understanding and will have rx canceled.

## 2022-11-02 NOTE — Telephone Encounter (Signed)
Pt states she needs a TB test done for work. She requests blood work since she cannot come back to have skin test read. Plz order lab. Pt scheduled lab visit today at 2:30.

## 2022-11-02 NOTE — Telephone Encounter (Signed)
I just saw this note and added the quantiferon test.  Can this be added on or has she not yet done blood work ?

## 2022-11-02 NOTE — Addendum Note (Signed)
Addended by: Damita Lack on: 11/02/2022 08:47 AM   Modules accepted: Orders

## 2022-11-02 NOTE — Telephone Encounter (Signed)
Plz call pharmacy - I'd like to cancel this prescription at this time.

## 2022-11-03 ENCOUNTER — Other Ambulatory Visit: Payer: Self-pay | Admitting: Family Medicine

## 2022-11-07 ENCOUNTER — Encounter: Payer: Self-pay | Admitting: Family Medicine

## 2022-11-07 DIAGNOSIS — E162 Hypoglycemia, unspecified: Secondary | ICD-10-CM

## 2022-11-07 MED ORDER — ACCU-CHEK SOFTCLIX LANCETS MISC: 12 refills | Status: DC

## 2022-11-12 ENCOUNTER — Encounter: Payer: Self-pay | Admitting: Family Medicine

## 2022-12-01 ENCOUNTER — Other Ambulatory Visit: Payer: Self-pay | Admitting: Family Medicine

## 2022-12-01 DIAGNOSIS — F988 Other specified behavioral and emotional disorders with onset usually occurring in childhood and adolescence: Secondary | ICD-10-CM

## 2022-12-03 NOTE — Telephone Encounter (Signed)
Last office visit 11/01/22 for hypoglycemia.  Last refilled 11/02/2022 for #30 with no refills.  Next Appt:  No future appointments.

## 2022-12-04 MED ORDER — METHYLPHENIDATE HCL ER (CD) 30 MG PO CPCR: 30.0000 mg | ORAL_CAPSULE | ORAL | 0 refills | Status: DC

## 2022-12-11 ENCOUNTER — Other Ambulatory Visit: Payer: Self-pay | Admitting: Family Medicine

## 2022-12-11 MED ORDER — METHYLPHENIDATE HCL 5 MG PO TABS: ORAL_TABLET | ORAL | 0 refills | Status: DC

## 2022-12-11 NOTE — Telephone Encounter (Signed)
Last office visit 11/01/22 for hypoglycemia.  Last refilled 10/26/2022 for #60 with no refills.  Next Appt: No future appointments.

## 2022-12-28 ENCOUNTER — Encounter: Payer: Self-pay | Admitting: Family Medicine

## 2023-01-09 ENCOUNTER — Other Ambulatory Visit: Payer: Self-pay | Admitting: Family Medicine

## 2023-01-09 DIAGNOSIS — F988 Other specified behavioral and emotional disorders with onset usually occurring in childhood and adolescence: Secondary | ICD-10-CM

## 2023-01-09 MED ORDER — METHYLPHENIDATE HCL ER (CD) 30 MG PO CPCR: 30.0000 mg | ORAL_CAPSULE | ORAL | 0 refills | Status: DC

## 2023-01-14 ENCOUNTER — Other Ambulatory Visit: Payer: Self-pay | Admitting: Family Medicine

## 2023-01-14 NOTE — Telephone Encounter (Signed)
Last office visit 11/01/2022 for hypoglycemia.  Last refilled 12/11/2022 for #60 with no refills.  Next Appt: No future appointments.

## 2023-01-15 ENCOUNTER — Other Ambulatory Visit: Payer: Self-pay | Admitting: Family Medicine

## 2023-01-16 ENCOUNTER — Encounter: Payer: Self-pay | Admitting: Family Medicine

## 2023-01-16 MED ORDER — METHYLPHENIDATE HCL 5 MG PO TABS: ORAL_TABLET | ORAL | 0 refills | Status: DC

## 2023-02-01 ENCOUNTER — Telehealth (INDEPENDENT_AMBULATORY_CARE_PROVIDER_SITE_OTHER): Payer: BC Managed Care – PPO | Admitting: Family Medicine

## 2023-02-01 ENCOUNTER — Encounter: Payer: Self-pay | Admitting: Family Medicine

## 2023-02-01 VITALS — Ht 68.5 in

## 2023-02-01 DIAGNOSIS — R41840 Attention and concentration deficit: Secondary | ICD-10-CM

## 2023-02-01 MED ORDER — METHYLPHENIDATE HCL ER (CD) 40 MG PO CPCR: 40.0000 mg | ORAL_CAPSULE | ORAL | 0 refills | Status: DC

## 2023-02-01 NOTE — Progress Notes (Signed)
VIRTUAL VISIT A virtual visit is felt to be most appropriate for this patient at this time.   I connected with the patient on 02/01/23 at  8:20 AM EST by virtual telehealth platform and verified that I am speaking with the correct person using two identifiers.   I discussed the limitations, risks, security and privacy concerns of performing an evaluation and management service by  virtual telehealth platform and the availability of in person appointments. I also discussed with the patient that there may be a patient responsible charge related to this service. The patient expressed understanding and agreed to proceed.  Patient location: Home Provider Location: [facility] Participants: Brittany [name] and Brittany [name]   Chief Complaint  Patient presents with   ADD    Discuss Medication    History of Present Illness:  47 y.o. female patient of Brittany Brucato E, MD presents for ADD follow up.   ADD, previously well controlled on methylphenidate CD 30 with an additional 5 mg short acting later in the day if needed.  She reports she has had inadequate control on concentration...    Pharmacy accidentally given wrong medications again... got metadate 30 mg extended release no CD...this does not work as well for her so she took additional 5mg  at the same time.Marland Kitchen essentially 40 mg longer release in AM... worked better...but she feels it does not last any longer than  CD work s better for her than ER gioven biphasic release ... She need that Am " punch" of medication as well as the longer acting component.    No heart racing, no anxiety, no SE.  No weight loss, weight gain.  BP Readings from Last 3 Encounters:  11/01/22 116/66  10/30/22 122/80  06/21/22 100/76      Metadate CD is a once-a-day capsule with biphasic release; initially there is a rapid release of methylphenidate, then a continuous-release phase.   COVID 19 screen No recent travel or known exposure to COVID19 The  patient denies respiratory symptoms of COVID 19 at this time.  The importance of social distancing was discussed today.   Review of Systems  Constitutional:  Negative for chills and fever.  HENT:  Negative for congestion and ear pain.   Eyes:  Negative for pain and redness.  Respiratory:  Negative for cough and shortness of breath.   Cardiovascular:  Negative for chest pain, palpitations and leg swelling.  Gastrointestinal:  Negative for abdominal pain, blood in stool, constipation, diarrhea, nausea and vomiting.  Genitourinary:  Negative for dysuria.  Musculoskeletal:  Negative for falls and myalgias.  Skin:  Negative for rash.  Neurological:  Negative for dizziness.  Psychiatric/Behavioral:  Negative for depression. The patient is not nervous/anxious.       Past Medical History:  Diagnosis Date   ADD (attention deficit disorder)    Adenomyosis    Anemia    Asthma    Celiac disease    Diverticulitis    Diverticulosis    Heart murmur    Ovarian cyst    PCOS (polycystic ovarian syndrome)     reports that she has never smoked. She has never used smokeless tobacco. She reports current alcohol use. She reports that she does not use drugs.   Current Outpatient Medications:    Accu-Chek Softclix Lancets lancets, Use as instructed for hypoglycemia, Disp: 100 each, Rfl: 12   albuterol (PROVENTIL) (2.5 MG/3ML) 0.083% nebulizer solution, TAKE 3 MLS BY NEBULIZATION EVERY 6 (SIX) HOURS AS NEEDED FOR WHEEZING  OR SHORTNESS OF BREATH., Disp: 150 mL, Rfl: 1   albuterol (VENTOLIN HFA) 108 (90 Base) MCG/ACT inhaler, TAKE 2 PUFFS BY MOUTH EVERY 6 HOURS AS NEEDED FOR WHEEZE OR SHORTNESS OF BREATH, Disp: 18 each, Rfl: 2   Budesonide (PULMICORT FLEXHALER) 90 MCG/ACT inhaler, INHALE 1 PUFF BY MOUTH TWICE A DAY, Disp: 1 each, Rfl: 2   EPINEPHRINE 0.3 mg/0.3 mL IJ SOAJ injection, INJECT AS DIRECTED, Disp: 2 each, Rfl: 0   glucose blood (ACCU-CHEK GUIDE) test strip, Use as instructed for hypoglycemia  E16.2, Disp: 100 each, Rfl: 3   L-Lysine 1000 MG TABS, Take 2 tablets by mouth daily., Disp: , Rfl:    methylphenidate (METADATE CD) 40 MG CR capsule, Take 1 capsule (40 mg total) by mouth every morning., Disp: 30 capsule, Rfl: 0   methylphenidate (RITALIN) 5 MG tablet, Take 1 tablet around noon  as needed for concentration later in the day., Disp: 30 tablet, Rfl: 0   methylphenidate (RITALIN) 5 MG tablet, Take 1 tablet around noon as needed for concentration later in the day, Disp: 30 tablet, Rfl: 0   methylphenidate (RITALIN) 5 MG tablet, Take 1-2 tablet around noon as needed for concentration later in the day, Disp: 60 tablet, Rfl: 0   montelukast (SINGULAIR) 10 MG tablet, TAKE 1 TABLET BY MOUTH EVERYDAY AT BEDTIME, Disp: 90 tablet, Rfl: 1   Omega-3 1000 MG CAPS, Take 1,000 mg by mouth daily., Disp: , Rfl:    QUERCETIN PO, Take 1 tablet by mouth daily., Disp: , Rfl:    Turmeric 500 MG TABS, Take by mouth. Take 2-3 tablets by mouth daily, Disp: , Rfl:    VITAMIN D PO, 25 mcg. Take 1/2 tablet daily, Disp: , Rfl:    zinc gluconate 50 MG tablet, Take 25 mg by mouth daily., Disp: , Rfl:    Observations/Objective: Height 5' 8.5" (1.74 m), last menstrual period 01/11/2023.  Physical Exam Constitutional:      General: The patient is not in acute distress. Pulmonary:     Effort: Pulmonary effort is normal. No respiratory distress.  Neurological:     Mental Status: The patient is alert and oriented to person, place, and time.  Psychiatric:        Mood and Affect: Mood normal.        Behavior: Behavior normal.    Assessment and Plan Attention or concentration deficit Assessment & Plan: PDMP reviewed during this encounter.  Patient has better response to CV form of Metadate as opposed to [facility] ER given the biphasic release and earlier "burst of medicine".  She feels that she needs a higher dose to improve her inadequate concentration. We will send in Metadate CD (not ER) 40 mg daily to  take in the morning.  If needed for improved concentration later in the day she can take an additional 5 mg of fast acting Ritalin.  Prescription was sent in for the Metadate CD 40 mg p.o. daily in AM to be filled November 16.  She already has prescription of the short acting 5 mg to last October 23, but at that point she will need a refill of the short acting. She will call or follow-up in 1 month for an update on how she is doing on the higher dose of Metadate CD.   Other orders -     Methylphenidate HCl ER (CD); Take 1 capsule (40 mg total) by mouth every morning.  Dispense: 30 capsule; Refill: 0      I  discussed the assessment and treatment plan with the patient. The patient was provided an opportunity to ask questions and all were answered. The patient agreed with the plan and demonstrated an understanding of the instructions.   The patient was advised to call back or seek an in-person evaluation if the symptoms worsen or if the condition fails to improve as anticipated.     Brittany Nora, MD

## 2023-02-01 NOTE — Assessment & Plan Note (Signed)
PDMP reviewed during this encounter.  Patient has better response to CV form of Metadate as opposed to [facility] ER given the biphasic release and earlier "burst of medicine".  She feels that she needs a higher dose to improve her inadequate concentration. We will send in Metadate CD (not ER) 40 mg daily to take in the morning.  If needed for improved concentration later in the day she can take an additional 5 mg of fast acting Ritalin.  Prescription was sent in for the Metadate CD 40 mg p.o. daily in AM to be filled November 16.  She already has prescription of the short acting 5 mg to last October 23, but at that point she will need a refill of the short acting. She will call or follow-up in 1 month for an update on how she is doing on the higher dose of Metadate CD.

## 2023-02-11 ENCOUNTER — Other Ambulatory Visit: Payer: Self-pay | Admitting: Family Medicine

## 2023-02-11 DIAGNOSIS — F988 Other specified behavioral and emotional disorders with onset usually occurring in childhood and adolescence: Secondary | ICD-10-CM

## 2023-02-11 NOTE — Telephone Encounter (Signed)
Last office visit 02/01/2023 for ADD.  Last refilled 01/16/2023 for #60 with no refills.  Next Appt: No future appointments.

## 2023-02-12 ENCOUNTER — Encounter: Payer: Self-pay | Admitting: Family Medicine

## 2023-02-12 MED ORDER — METHYLPHENIDATE HCL 5 MG PO TABS: ORAL_TABLET | ORAL | 0 refills | Status: DC

## 2023-02-13 NOTE — Telephone Encounter (Signed)
Please submit PA for Methylphenidate 40 mg CR capsule.

## 2023-02-14 ENCOUNTER — Telehealth: Payer: Self-pay | Admitting: Family Medicine

## 2023-02-14 NOTE — Telephone Encounter (Signed)
PA approved 02/14/2023 through 02/13/2026.  Annabelle Harman notified of approval via MyChart.

## 2023-02-14 NOTE — Telephone Encounter (Signed)
PA for Methylphenidate CD 40 mg completed on CoverMyMeds.  Sent to [facility] for review.  Can take up to 72 hours for a decision.

## 2023-03-16 ENCOUNTER — Other Ambulatory Visit: Payer: Self-pay | Admitting: Family Medicine

## 2023-03-17 ENCOUNTER — Other Ambulatory Visit: Payer: Self-pay | Admitting: Family Medicine

## 2023-03-17 DIAGNOSIS — F988 Other specified behavioral and emotional disorders with onset usually occurring in childhood and adolescence: Secondary | ICD-10-CM

## 2023-03-18 ENCOUNTER — Encounter: Payer: Self-pay | Admitting: Family Medicine

## 2023-03-18 DIAGNOSIS — F988 Other specified behavioral and emotional disorders with onset usually occurring in childhood and adolescence: Secondary | ICD-10-CM

## 2023-03-18 MED ORDER — METHYLPHENIDATE HCL ER (CD) 40 MG PO CPCR: 40.0000 mg | ORAL_CAPSULE | ORAL | 0 refills | Status: DC

## 2023-03-18 NOTE — Telephone Encounter (Signed)
Last OV: 02/01/2023 Pending OV: Nothing scheduled at this time Medication: Methylphenidate 40mg  Directions: Take one capsule daily Last Refill: 02/01/2023 Qty: #30 with 0 refills

## 2023-03-18 NOTE — Telephone Encounter (Signed)
Last office visit 02/01/2023 (video) for ADD.  Last refilled 02/01/2023 for #30 with no refills.  Next Appt: No future appointments.

## 2023-03-18 NOTE — Telephone Encounter (Signed)
See refill request.

## 2023-03-22 ENCOUNTER — Other Ambulatory Visit: Payer: Self-pay | Admitting: Family Medicine

## 2023-03-22 LAB — HM MAMMOGRAPHY

## 2023-03-25 ENCOUNTER — Telehealth: Payer: Self-pay | Admitting: *Deleted

## 2023-03-25 MED ORDER — METHYLPHENIDATE HCL ER (CD) 30 MG PO CPCR: 30.0000 mg | ORAL_CAPSULE | ORAL | 0 refills | Status: DC

## 2023-03-25 NOTE — Telephone Encounter (Signed)
See MyChart message.  This has been addressed by Dr. Ermalene Searing and Rx sent to pharmacy.

## 2023-03-25 NOTE — Telephone Encounter (Signed)
Copied from CRM [id number]. Topic: Clinical - Prescription Issue >> Mar 25, 2023  2:18 PM Fuller Mandril wrote: Reason for CRM: Pt called states is having issues filling methylphenidate (METADATE CD) 40 MG CR capsule. Pharmacy states Rx is on backorder with company nad they will not be getting any in. Pt has to leave for out of town tomorrow and wants to know if her previous Rx can be called in the meantime until she is able to get this one. Thank You

## 2023-03-25 NOTE — Telephone Encounter (Signed)
Please disregard my request to have you call patient to clarify prescription.  I now see her MyChart message.  I have sent the requested methylphenidate CD30 milligrams to [facility].  Please let patient know

## 2023-03-26 LAB — HM PAP SMEAR
HM Pap smear: POSITIVE
HPV, high-risk: NEGATIVE

## 2023-04-02 MED ORDER — METHYLPHENIDATE HCL 5 MG PO TABS: ORAL_TABLET | ORAL | 0 refills | Status: DC

## 2023-04-02 NOTE — Telephone Encounter (Signed)
 See mychart.

## 2023-04-02 NOTE — Addendum Note (Signed)
 Addended by: Kerby Nora E on: 04/02/2023 05:37 PM   Modules accepted: Orders

## 2023-04-17 ENCOUNTER — Telehealth: Payer: Self-pay | Admitting: Family Medicine

## 2023-04-17 VITALS — Ht 69.0 in | Wt 272.0 lb

## 2023-04-17 DIAGNOSIS — N938 Other specified abnormal uterine and vaginal bleeding: Secondary | ICD-10-CM | POA: Diagnosis not present

## 2023-04-17 DIAGNOSIS — E282 Polycystic ovarian syndrome: Secondary | ICD-10-CM | POA: Diagnosis not present

## 2023-04-17 NOTE — Progress Notes (Signed)
VIRTUAL VISIT A virtual visit is felt to be most appropriate for this patient at this time.   I connected with the patient on 04/17/23 at 11:00 AM EST by virtual telehealth platform and verified that I am speaking with the correct person using two identifiers.   I discussed the limitations, risks, security and privacy concerns of performing an evaluation and management service by  virtual telehealth platform and the availability of in person appointments. I also discussed with the patient that there may be a patient responsible charge related to this service. The patient expressed understanding and agreed to proceed.  Patient location: Home Provider Location: [facility] [place] [facility] [facility] Participants: Kerby Nora and [name]   Chief Complaint  Patient presents with   Hormones   Menopause    History of Present Illness:  48 y.o. female patient of Bilal Manzer E, MD presents  for discuss  hormones and menopause.   She reports that she continues to struggle with weight loss. She has been on metformin for PCOS, but had to stop this given hypoglycemia.Marland Kitchen did not help with appetite. No further spells of hypoglycemia She has been working on regular exercise and doing intermittent fasting.  No longer on OCPs. 07/2021.. stopped given SE, weight gain.   Last menses was in early December. Before then had monthly menses, but was getting closer together.   Saw Dr. Tedd Sias  02/12/2023 ENDO: no further work up.  Discussed GP1 meds as option.   Seeing nutritionist.  She is considering GLP1 medications.    COVID 19 screen No recent travel or known exposure to COVID19 The patient denies respiratory symptoms of COVID 19 at this time.  The importance of social distancing was discussed today.   Review of Systems  Constitutional:  Negative for chills and fever.  HENT:  Negative for congestion and ear pain.   Eyes:  Negative for pain and redness.  Respiratory:  Negative for cough and shortness of  breath.   Cardiovascular:  Negative for chest pain, palpitations and leg swelling.  Gastrointestinal:  Negative for abdominal pain, blood in stool, constipation, diarrhea, nausea and vomiting.  Genitourinary:  Negative for dysuria.  Musculoskeletal:  Negative for falls and myalgias.  Skin:  Negative for rash.  Neurological:  Negative for dizziness.  Psychiatric/Behavioral:  Negative for depression. The patient is not nervous/anxious.       Past Medical History:  Diagnosis Date   ADD (attention deficit disorder)    Adenomyosis    Anemia    Asthma    Celiac disease    Diverticulitis    Diverticulosis    Heart murmur    Ovarian cyst    PCOS (polycystic ovarian syndrome)     reports that she has never smoked. She has never used smokeless tobacco. She reports current alcohol use. She reports that she does not use drugs.   Current Outpatient Medications:    Accu-Chek Softclix Lancets lancets, Use as instructed for hypoglycemia, Disp: 100 each, Rfl: 12   albuterol (PROVENTIL) (2.5 MG/3ML) 0.083% nebulizer solution, TAKE 3 MLS BY NEBULIZATION EVERY 6 (SIX) HOURS AS NEEDED FOR WHEEZING OR SHORTNESS OF BREATH., Disp: 150 mL, Rfl: 2   albuterol (VENTOLIN HFA) 108 (90 Base) MCG/ACT inhaler, TAKE 2 PUFFS BY MOUTH EVERY 6 HOURS AS NEEDED FOR WHEEZE OR SHORTNESS OF BREATH, Disp: 18 each, Rfl: 2   Budesonide (PULMICORT FLEXHALER) 90 MCG/ACT inhaler, INHALE 1 PUFF BY MOUTH TWICE A DAY, Disp: 1 each, Rfl: 2   EPINEPHRINE 0.3  mg/0.3 mL IJ SOAJ injection, INJECT AS DIRECTED, Disp: 2 each, Rfl: 0   glucose blood (ACCU-CHEK GUIDE) test strip, Use as instructed for hypoglycemia E16.2, Disp: 100 each, Rfl: 3   L-Lysine 1000 MG TABS, Take 2 tablets by mouth daily., Disp: , Rfl:    methylphenidate (METADATE CD) 30 MG CR capsule, Take 1 capsule (30 mg total) by mouth every morning., Disp: 30 capsule, Rfl: 0   methylphenidate (RITALIN) 5 MG tablet, Take 1-2 tablets around noon as needed for concentration later  in the day, Disp: 60 tablet, Rfl: 0   methylphenidate (RITALIN) 5 MG tablet, Take 1-2 tablets around noon  as needed for concentration later in the day., Disp: 60 tablet, Rfl: 0   methylphenidate (RITALIN) 5 MG tablet, Take 1-2 tablet around noon as needed for concentration later in the day, Disp: 30 tablet, Rfl: 0   montelukast (SINGULAIR) 10 MG tablet, TAKE 1 TABLET BY MOUTH EVERYDAY AT BEDTIME, Disp: 90 tablet, Rfl: 0   Omega-3 1000 MG CAPS, Take 1,000 mg by mouth daily., Disp: , Rfl:    QUERCETIN PO, Take 1 tablet by mouth daily., Disp: , Rfl:    Turmeric 500 MG TABS, Take by mouth. Take 2-3 tablets by mouth daily, Disp: , Rfl:    VITAMIN D PO, 25 mcg. Take 1/2 tablet daily, Disp: , Rfl:    zinc gluconate 50 MG tablet, Take 25 mg by mouth daily., Disp: , Rfl:    Observations/Objective: Height 5\' 9"  (1.753 m), weight 272 lb (123.4 kg).  Physical Exam Constitutional:      General: The patient is not in acute distress. Pulmonary:     Effort: Pulmonary effort is normal. No respiratory distress.  Neurological:     Mental Status: The patient is alert and oriented to person, place, and time.  Psychiatric:        Mood and Affect: Mood normal.        Behavior: Behavior normal.    Assessment and Plan DUB (dysfunctional uterine bleeding) -     CBC with Differential/Platelet; Future -     Estradiol; Future -     Follicle stimulating hormone; Future -     Luteinizing hormone; Future -     TSH; Future -     T4, free; Future -     T3, free; Future -     Testosterone, Free, Total, SHBG; Future  PCOS (polycystic ovarian syndrome) -     Hemoglobin A1c; Future -     Lipid panel; Future  Severe obesity (BMI 35.0-39.9) with comorbidity (HCC) -     Lipid panel; Future   Will mover forward with evaluation for   hormonal evaluation to eval status of PCOS. Pt likely perimenopausal. Due for diabetes and cholesterol screen. Discussed consideration of GLP-1 medication which I feel will be  helpful for this patient. She will continue working on healthy eating habits but we discussed stopping intermittent fasting as it is not helpful for her.  We discussed small frequent meals through the day keeping protein and fiber high with consistent low carbs. Continue seeing nutritionist and continue regular exercise.   I discussed the assessment and treatment plan with the patient. The patient was provided an opportunity to ask questions and all were answered. The patient agreed with the plan and demonstrated an understanding of the instructions.   The patient was advised to call back or seek an in-person evaluation if the symptoms worsen or if the condition fails to improve as  anticipated.     Kerby Nora, MD

## 2023-04-18 ENCOUNTER — Other Ambulatory Visit (INDEPENDENT_AMBULATORY_CARE_PROVIDER_SITE_OTHER): Payer: 59

## 2023-04-18 ENCOUNTER — Encounter: Payer: Self-pay | Admitting: Family Medicine

## 2023-04-18 DIAGNOSIS — E282 Polycystic ovarian syndrome: Secondary | ICD-10-CM

## 2023-04-18 DIAGNOSIS — N938 Other specified abnormal uterine and vaginal bleeding: Secondary | ICD-10-CM | POA: Diagnosis not present

## 2023-04-18 LAB — CBC WITH DIFFERENTIAL/PLATELET
Basophils Absolute: 0.1 10*3/uL (ref 0.0–0.1)
Basophils Relative: 1.2 % (ref 0.0–3.0)
Eosinophils Absolute: 0.1 10*3/uL (ref 0.0–0.7)
Eosinophils Relative: 1.3 % (ref 0.0–5.0)
HCT: 37.3 % (ref 36.0–46.0)
Hemoglobin: 12 g/dL (ref 12.0–15.0)
Lymphocytes Relative: 35.1 % (ref 12.0–46.0)
Lymphs Abs: 1.6 10*3/uL (ref 0.7–4.0)
MCHC: 32.2 g/dL (ref 30.0–36.0)
MCV: 87.4 fL (ref 78.0–100.0)
Monocytes Absolute: 0.3 10*3/uL (ref 0.1–1.0)
Monocytes Relative: 6.3 % (ref 3.0–12.0)
Neutro Abs: 2.5 10*3/uL (ref 1.4–7.7)
Neutrophils Relative %: 56.1 % (ref 43.0–77.0)
Platelets: 244 10*3/uL (ref 150.0–400.0)
RBC: 4.27 Mil/uL (ref 3.87–5.11)
RDW: 13.1 % (ref 11.5–15.5)
WBC: 4.5 10*3/uL (ref 4.0–10.5)

## 2023-04-18 LAB — LUTEINIZING HORMONE: LH: 4.07 m[IU]/mL

## 2023-04-18 LAB — LIPID PANEL
Cholesterol: 218 mg/dL — ABNORMAL HIGH (ref 0–200)
HDL: 72.9 mg/dL (ref >39.00)
LDL Cholesterol: 134 mg/dL — ABNORMAL HIGH (ref 0–99)
NonHDL: 144.86
Total CHOL/HDL Ratio: 3
Triglycerides: 52 mg/dL (ref 0.0–149.0)
VLDL: 10.4 mg/dL (ref 0.0–40.0)

## 2023-04-18 LAB — T3, FREE: T3, Free: 2.1 pg/mL — ABNORMAL LOW (ref 2.3–4.2)

## 2023-04-18 LAB — HEMOGLOBIN A1C: Hgb A1c MFr Bld: 4.8 % (ref 4.6–6.5)

## 2023-04-18 LAB — FOLLICLE STIMULATING HORMONE: FSH: 0.7 m[IU]/mL

## 2023-04-18 LAB — TSH: TSH: 1.65 u[IU]/mL (ref 0.35–5.50)

## 2023-04-18 LAB — T4, FREE: Free T4: 0.73 ng/dL (ref 0.60–1.60)

## 2023-04-18 NOTE — Addendum Note (Signed)
Addended by: Alvina Chou on: 04/18/2023 07:51 AM   Modules accepted: Orders

## 2023-04-19 LAB — ESTRADIOL: Estradiol: 494 pg/mL — ABNORMAL HIGH

## 2023-04-24 LAB — TESTOS,TOTAL,FREE AND SHBG (FEMALE)
Free Testosterone: 2.7 pg/mL (ref 0.1–6.4)
Sex Hormone Binding: 133.8 nmol/L — ABNORMAL HIGH (ref 17–124)
Testosterone, Total, LC-MS-MS: 35 ng/dL (ref 2–45)

## 2023-04-25 ENCOUNTER — Other Ambulatory Visit: Payer: Self-pay | Admitting: Family Medicine

## 2023-04-25 MED ORDER — TIRZEPATIDE 2.5 MG/0.5ML ~~LOC~~ SOAJ: 2.5000 mg | SUBCUTANEOUS | 0 refills | Status: DC

## 2023-04-26 MED ORDER — METHYLPHENIDATE HCL ER (CD) 40 MG PO CPCR: 40.0000 mg | ORAL_CAPSULE | ORAL | 0 refills | Status: DC

## 2023-04-26 NOTE — Addendum Note (Signed)
Addended by: Kerby Nora E on: 04/26/2023 05:41 PM   Modules accepted: Orders

## 2023-05-02 ENCOUNTER — Telehealth: Payer: Self-pay

## 2023-05-02 MED ORDER — METHYLPHENIDATE HCL ER (CD) 30 MG PO CPCR: 30.0000 mg | ORAL_CAPSULE | ORAL | 0 refills | Status: DC

## 2023-05-02 NOTE — Telephone Encounter (Signed)
 Pharmacy Patient Advocate Encounter   Received notification from Patient Advice Request messages that prior authorization for Zepbound  2.5MG /0.5ML Pen is required/requested.   Insurance verification completed.   The patient is insured through  [employer]  .   Per test claim: Product/Service Not Covered- Plan/Benefit Exclusion

## 2023-05-02 NOTE — Telephone Encounter (Signed)
 Per test claim: Product/service not covered- Plan/benefit exclusion

## 2023-05-02 NOTE — Addendum Note (Signed)
 Addended by: Herby Lolling E on: 05/02/2023 01:28 PM   Modules accepted: Orders

## 2023-05-03 ENCOUNTER — Telehealth: Payer: Self-pay | Admitting: Pharmacist

## 2023-05-03 ENCOUNTER — Other Ambulatory Visit: Payer: Self-pay | Admitting: Family Medicine

## 2023-05-03 MED ORDER — TIRZEPATIDE-WEIGHT MANAGEMENT 2.5 MG/0.5ML ~~LOC~~ SOLN
2.5000 mg | SUBCUTANEOUS | 1 refills | Status: DC
Start: 2023-05-03 — End: 2023-05-03

## 2023-05-03 NOTE — Telephone Encounter (Signed)
  Pharmacy comment: Alternative Requested:ONLY AVAILABLE THOUGH [employer].   All Pharmacy Suggested Alternatives:  Semaglutide-Weight Management (WEGOVY) 0.25 MG/0.5ML SOAJ Liraglutide -Weight Management (SAXENDA) 18 MG/3ML SOPN

## 2023-05-03 NOTE — Telephone Encounter (Signed)
 Mounjaro  is approved exclusively as an adjunct to diet and exercise to improve glycemic control in adults with type 2 diabetes mellitus. A review of Ms. Azpeitia medical chart reveals no documented diagnosis of type 2 diabetes or an elevated A1C. Therefore, she does not currently meet the criteria for prior authorization of this medication. If clinically appropriate, alternative options such as Saxenda, Zepbound , or Georjean may be considered for this patient.   Thank you, Devere Pandy, PharmD Clinical Pharmacist  [place]  Direct Dial: [phone]

## 2023-05-03 NOTE — Telephone Encounter (Signed)
 Not sure what happened with this prescription.  Mounjaro was not supposed to be sent in it should have been Zepbound.  I will resend the prescription.

## 2023-05-03 NOTE — Addendum Note (Signed)
 Addended by: Herby Lolling E on: 05/03/2023 03:45 PM   Modules accepted: Orders

## 2023-05-09 ENCOUNTER — Other Ambulatory Visit: Payer: Self-pay | Admitting: Family Medicine

## 2023-05-09 DIAGNOSIS — F988 Other specified behavioral and emotional disorders with onset usually occurring in childhood and adolescence: Secondary | ICD-10-CM

## 2023-05-09 MED ORDER — METHYLPHENIDATE HCL 5 MG PO TABS: ORAL_TABLET | ORAL | 0 refills | Status: DC

## 2023-05-09 MED ORDER — METHYLPHENIDATE HCL ER (CD) 30 MG PO CPCR: 30.0000 mg | ORAL_CAPSULE | ORAL | 0 refills | Status: DC

## 2023-05-09 NOTE — Addendum Note (Signed)
Addended by: Kerby Nora E on: 05/09/2023 04:22 PM   Modules accepted: Orders

## 2023-05-09 NOTE — Telephone Encounter (Signed)
Last office visit 04/17/23 for DUB.  Last refilled 04/02/2023 for #30 with no refills.  Next Appt: No future appointments.

## 2023-05-10 ENCOUNTER — Other Ambulatory Visit (HOSPITAL_COMMUNITY): Payer: Self-pay

## 2023-05-24 ENCOUNTER — Encounter: Payer: Self-pay | Admitting: Family Medicine

## 2023-05-24 ENCOUNTER — Ambulatory Visit: Payer: 59 | Admitting: Family Medicine

## 2023-05-24 VITALS — BP 100/70 | HR 53 | Temp 98.6°F | Ht 69.0 in | Wt 282.4 lb

## 2023-05-24 DIAGNOSIS — F988 Other specified behavioral and emotional disorders with onset usually occurring in childhood and adolescence: Secondary | ICD-10-CM

## 2023-05-24 DIAGNOSIS — R41840 Attention and concentration deficit: Secondary | ICD-10-CM | POA: Diagnosis not present

## 2023-05-24 DIAGNOSIS — E162 Hypoglycemia, unspecified: Secondary | ICD-10-CM | POA: Diagnosis not present

## 2023-05-24 DIAGNOSIS — E282 Polycystic ovarian syndrome: Secondary | ICD-10-CM

## 2023-05-24 MED ORDER — ACCU-CHEK SOFTCLIX LANCETS MISC: 12 refills | Status: AC

## 2023-05-24 MED ORDER — METHYLPHENIDATE HCL ER (CD) 30 MG PO CPCR
30.0000 mg | ORAL_CAPSULE | ORAL | 0 refills | Status: DC
Start: 2023-05-24 — End: 2023-08-14

## 2023-05-24 MED ORDER — METHYLPHENIDATE HCL 5 MG PO TABS: ORAL_TABLET | ORAL | 0 refills | Status: DC

## 2023-05-24 MED ORDER — METFORMIN HCL ER 500 MG PO TB24: 500.0000 mg | ORAL_TABLET | Freq: Every day | ORAL | 3 refills | Status: DC

## 2023-05-24 MED ORDER — METHYLPHENIDATE HCL ER (CD) 30 MG PO CPCR: 30.0000 mg | ORAL_CAPSULE | ORAL | 0 refills | Status: DC

## 2023-05-24 NOTE — Assessment & Plan Note (Signed)
 Chronic, she is doing well on current regimen although would prefer higher dose Metadate but she cannot find this at her pharmacies. Will continue Metadate CD30 milligrams in the morning with an additional 5 to 10 mg of Ritalin at the same time as well as later in the day for continued symptoms. PDMP reviewed. Refills for the next 2 months sent to the pharmacy.  Patient will follow-up for 65-month reevaluation.

## 2023-05-24 NOTE — Assessment & Plan Note (Signed)
 Chronic, she has restarted on birth control.  Given she has noted associated weight gain we will consider restarting metformin 500 mg XR 2 facilitate weight loss.  We did discuss GLP-1 medication and the [employer] cash pay program which she will look into. Given hypoglycemia in the past she will follow her blood sugar frequently.  Of note we did discuss metformin 500 mg intermediate acting twice daily instead of the extended release for better flexibility but she would like to first try the XR.

## 2023-05-24 NOTE — Progress Notes (Signed)
 Patient ID: Brittany [name], female    DOB: Sep 14, 1975, 48 y.o.   MRN: [id number]  This visit was conducted in person.  BP 100/70 (BP Location: Left Arm, Patient Position: Sitting, Cuff Size: Large)   Pulse (!) 53   Temp 98.6 F (37 C) (Temporal)   Ht 5\' 9"  (1.753 m)   Wt 282 lb 6 oz (128.1 kg)   SpO2 94%   BMI 41.70 kg/m    CC:  Chief Complaint  Patient presents with   PCOS Pain   Weight Gain   ADD    Subjective:   HPI: Brittany [name] is a 48 y.o. female presenting on 05/24/2023 for PCOS Pain, Weight Gain, and ADD  She presents for follow up obesity BMI 41: She has been working on dietary changes  She continues to note weight gain.   Stopped metformin XR 500 mg daily given some hypo  GLP! Med not covered in nondiabetic without CAD comp. Wt Readings from Last 3 Encounters:  05/24/23 282 lb 6 oz (128.1 kg)  04/17/23 272 lb (123.4 kg)  06/21/22 262 lb (118.8 kg)    ADD;  No SE    Metadate  CR using 30 mg daily... with additional 10 mg  along with it as well as additonalprn later in the day.    Hx of PCOS followed by GYN.. has restarted the birth control  Relevant past medical, surgical, family and social history reviewed and updated as indicated. Interim medical history since our last visit reviewed. Allergies and medications reviewed and updated. Outpatient Medications Prior to Visit  Medication Sig Dispense Refill   albuterol (PROVENTIL) (2.5 MG/3ML) 0.083% nebulizer solution TAKE 3 MLS BY NEBULIZATION EVERY 6 (SIX) HOURS AS NEEDED FOR WHEEZING OR SHORTNESS OF BREATH. 150 mL 2   albuterol (VENTOLIN HFA) 108 (90 Base) MCG/ACT inhaler TAKE 2 PUFFS BY MOUTH EVERY 6 HOURS AS NEEDED FOR WHEEZE OR SHORTNESS OF BREATH 18 each 2   Budesonide (PULMICORT FLEXHALER) 90 MCG/ACT inhaler INHALE 1 PUFF BY MOUTH TWICE A DAY 1 each 2   EPINEPHRINE 0.3 mg/0.3 mL IJ SOAJ injection INJECT AS DIRECTED 2 each 0   glucose blood (ACCU-CHEK GUIDE) test strip Use as instructed for hypoglycemia  E16.2 100 each 3   L-Lysine 1000 MG TABS Take 2 tablets by mouth daily.     LOW-OGESTREL 0.3-30 MG-MCG tablet Take 1 tablet by mouth daily.     methylphenidate (RITALIN) 5 MG tablet Take 1-2 tablets around noon as needed for concentration later in the day 60 tablet 0   methylphenidate (RITALIN) 5 MG tablet Take 1-2 tablet around noon as needed for concentration later in the day 60 tablet 0   montelukast (SINGULAIR) 10 MG tablet TAKE 1 TABLET BY MOUTH EVERYDAY AT BEDTIME 90 tablet 0   Omega-3 1000 MG CAPS Take 1,000 mg by mouth daily.     QUERCETIN PO Take 1 tablet by mouth daily.     Turmeric 500 MG TABS Take by mouth. Take 2-3 tablets by mouth daily     VITAMIN D PO 25 mcg. Take 1/2 tablet daily     zinc gluconate 50 MG tablet Take 25 mg by mouth daily.     Accu-Chek Softclix Lancets lancets Use as instructed for hypoglycemia 100 each 12   methylphenidate (METADATE CD) 30 MG CR capsule Take 1 capsule (30 mg total) by mouth every morning. 30 capsule 0   methylphenidate (RITALIN) 5 MG tablet Take 1-2 tablets around noon  as needed for concentration later in the day. 60 tablet 0   Semaglutide-Weight Management (WEGOVY) 0.25 MG/0.5ML SOAJ Inject 0.25 mg into the skin once a week. 2 mL 0   No facility-administered medications prior to visit.     Per HPI unless specifically indicated in ROS section below Review of Systems  Constitutional:  Negative for fatigue and fever.  HENT:  Negative for congestion and ear pain.   Eyes:  Negative for pain.  Respiratory:  Negative for cough, chest tightness and shortness of breath.   Cardiovascular:  Negative for chest pain, palpitations and leg swelling.  Gastrointestinal:  Negative for abdominal pain.  Genitourinary:  Negative for dysuria and vaginal bleeding.  Musculoskeletal:  Negative for back pain.  Neurological:  Negative for syncope, light-headedness and headaches.  Psychiatric/Behavioral:  Negative for dysphoric mood.    Objective:  BP 100/70  (BP Location: Left Arm, Patient Position: Sitting, Cuff Size: Large)   Pulse (!) 53   Temp 98.6 F (37 C) (Temporal)   Ht 5\' 9"  (1.753 m)   Wt 282 lb 6 oz (128.1 kg)   SpO2 94%   BMI 41.70 kg/m   Wt Readings from Last 3 Encounters:  05/24/23 282 lb 6 oz (128.1 kg)  04/17/23 272 lb (123.4 kg)  06/21/22 262 lb (118.8 kg)      Physical Exam Constitutional:      General: She is not in acute distress.    Appearance: Normal appearance. She is well-developed. She is not ill-appearing or toxic-appearing.  HENT:     Head: Normocephalic.     Right Ear: Hearing, tympanic membrane, ear canal and external ear normal. Tympanic membrane is not erythematous, retracted or bulging.     Left Ear: Hearing, tympanic membrane, ear canal and external ear normal. Tympanic membrane is not erythematous, retracted or bulging.     Nose: No mucosal edema or rhinorrhea.     Right Sinus: No maxillary sinus tenderness or frontal sinus tenderness.     Left Sinus: No maxillary sinus tenderness or frontal sinus tenderness.     Mouth/Throat:     Pharynx: Uvula midline.  Eyes:     General: Lids are normal. Lids are everted, no foreign bodies appreciated.     Conjunctiva/sclera: Conjunctivae normal.     Pupils: Pupils are equal, round, and reactive to light.  Neck:     Thyroid: No thyroid mass or thyromegaly.     Vascular: No carotid bruit.     Trachea: Trachea normal.  Cardiovascular:     Rate and Rhythm: Normal rate and regular rhythm.     Pulses: Normal pulses.     Heart sounds: Normal heart sounds, S1 normal and S2 normal. No murmur heard.    No friction rub. No gallop.  Pulmonary:     Effort: Pulmonary effort is normal. No tachypnea or respiratory distress.     Breath sounds: Normal breath sounds. No decreased breath sounds, wheezing, rhonchi or rales.  Abdominal:     General: Bowel sounds are normal.     Palpations: Abdomen is soft.     Tenderness: There is no abdominal tenderness.  Musculoskeletal:      Cervical back: Normal range of motion and neck supple.  Skin:    General: Skin is warm and dry.     Findings: No rash.  Neurological:     Mental Status: She is alert.  Psychiatric:        Mood and Affect: Mood is not anxious or depressed.  Speech: Speech normal.        Behavior: Behavior normal. Behavior is cooperative.        Thought Content: Thought content normal.        Judgment: Judgment normal.       Results for orders placed or performed in visit on 04/18/23  T3, free   Collection Time: 04/18/23  7:48 AM  Result Value Ref Range   T3, Free 2.1 (L) 2.3 - 4.2 pg/mL  T4, free   Collection Time: 04/18/23  7:48 AM  Result Value Ref Range   Free T4 0.73 0.60 - 1.60 ng/dL  TSH   Collection Time: 04/18/23  7:48 AM  Result Value Ref Range   TSH 1.65 0.35 - 5.50 uIU/mL  Luteinizing hormone   Collection Time: 04/18/23  7:48 AM  Result Value Ref Range   LH 4.07 mIU/mL  Follicle stimulating hormone   Collection Time: 04/18/23  7:48 AM  Result Value Ref Range   FSH 0.7 mIU/ML  Estradiol   Collection Time: 04/18/23  7:48 AM  Result Value Ref Range   Estradiol 494 (H) pg/mL  Lipid panel   Collection Time: 04/18/23  7:48 AM  Result Value Ref Range   Cholesterol 218 (H) 0 - 200 mg/dL   Triglycerides 43.3 0.0 - 149.0 mg/dL   HDL 29.51 >88.41 mg/dL   VLDL 66.0 0.0 - 63.0 mg/dL   LDL Cholesterol 160 (H) 0 - 99 mg/dL   Total CHOL/HDL Ratio 3    NonHDL 144.86   CBC with Differential/Platelet   Collection Time: 04/18/23  7:48 AM  Result Value Ref Range   WBC 4.5 4.0 - 10.5 K/uL   RBC 4.27 3.87 - 5.11 Mil/uL   Hemoglobin 12.0 12.0 - 15.0 g/dL   HCT 10.9 32.3 - 55.7 %   MCV 87.4 78.0 - 100.0 fl   MCHC 32.2 30.0 - 36.0 g/dL   RDW 32.2 02.5 - 42.7 %   Platelets 244.0 150.0 - 400.0 K/uL   Neutrophils Relative % 56.1 43.0 - 77.0 %   Lymphocytes Relative 35.1 12.0 - 46.0 %   Monocytes Relative 6.3 3.0 - 12.0 %   Eosinophils Relative 1.3 0.0 - 5.0 %   Basophils  Relative 1.2 0.0 - 3.0 %   Neutro Abs 2.5 1.4 - 7.7 K/uL   Lymphs Abs 1.6 0.7 - 4.0 K/uL   Monocytes Absolute 0.3 0.1 - 1.0 K/uL   Eosinophils Absolute 0.1 0.0 - 0.7 K/uL   Basophils Absolute 0.1 0.0 - 0.1 K/uL  Hemoglobin A1c   Collection Time: 04/18/23  7:48 AM  Result Value Ref Range   Hgb A1c MFr Bld 4.8 4.6 - 6.5 %  Testos,Total,Free and SHBG (Female)   Collection Time: 04/18/23  7:51 AM  Result Value Ref Range   Testosterone, Total, LC-MS-MS 35 2 - 45 ng/dL   Free Testosterone 2.7 0.1 - 6.4 pg/mL   Sex Hormone Binding 133.8 (H) 17 - 124 nmol/L    Assessment and Plan  Attention or concentration deficit Assessment & Plan: Chronic, she is doing well on current regimen although would prefer higher dose Metadate but she cannot find this at her pharmacies. Will continue Metadate CD30 milligrams in the morning with an additional 5 to 10 mg of Ritalin at the same time as well as later in the day for continued symptoms. PDMP reviewed. Refills for the next 2 months sent to the pharmacy.  Patient will follow-up for 76-month reevaluation.   PCOS (polycystic ovarian  syndrome) Assessment & Plan: Chronic, she has restarted on birth control.  Given she has noted associated weight gain we will consider restarting metformin 500 mg XR 2 facilitate weight loss.  We did discuss GLP-1 medication and the [employer] direct cash pay program which she will look into. Given hypoglycemia in the past she will follow her blood sugar frequently.  Of note we did discuss metformin 500 mg intermediate acting twice daily instead of the extended release for better flexibility but she would like to first try the XR.   Hypoglycemia -     Accu-Chek Softclix Lancets; Use as instructed for hypoglycemia  Dispense: 100 each; Refill: 12  Attention deficit disorder (ADD) without hyperactivity -     Methylphenidate HCl; Take 2 tablets in AM with Metadate CD 30 mg and additional 2 tablet around noon  as needed for  concentration later in the day.  Dispense: 120 tablet; Refill: 0 -     Methylphenidate HCl; Take 2 tablets in AM with Metadate CD 30 mg and additional 2 tablet around noon  as needed for concentration later in the day.  Dispense: 120 tablet; Refill: 0  Severe obesity (BMI 35.0-39.9) with comorbidity [facility]) Assessment & Plan: Chronic, poor control will restart metformin.  The patient looking into [employer] direct program for GLP-1 medication.  Zepbound   Other orders -     metFORMIN HCl ER; Take 1 tablet (500 mg total) by mouth daily with breakfast.  Dispense: 30 tablet; Refill: 3 -     Methylphenidate HCl ER (CD); Take 1 capsule (30 mg total) by mouth every morning. Fill after 07/07/2023  Dispense: 30 capsule; Refill: 0 -     Methylphenidate HCl ER (CD); Take 1 capsule (30 mg total) by mouth every morning. FIll after 06/06/2023  Dispense: 30 capsule; Refill: 0    Return in about 4 weeks (around 06/21/2023) for annual physical with fasting labs prior.   Kerby Nora, MD

## 2023-05-24 NOTE — Assessment & Plan Note (Signed)
 Chronic, poor control will restart metformin.  The patient looking into [employer] direct program for GLP-1 medication.  Zepbound

## 2023-05-29 ENCOUNTER — Other Ambulatory Visit (HOSPITAL_COMMUNITY): Payer: Self-pay

## 2023-06-04 ENCOUNTER — Telehealth: Payer: Self-pay | Admitting: *Deleted

## 2023-06-04 DIAGNOSIS — E282 Polycystic ovarian syndrome: Secondary | ICD-10-CM

## 2023-06-04 NOTE — Telephone Encounter (Signed)
-----   Message from Alvina Chou sent at 06/03/2023  9:38 AM EDT ----- Regarding: Lab orders for Mon, 3.24.25 Patient is scheduled for CPX labs, please order future labs, Thanks , Camelia Eng

## 2023-06-04 NOTE — Telephone Encounter (Signed)
 I agree patient does not need prephysical labs.  Call and let patient know.  It is possible she has something she wants to check.  If so we can talk about it we will order it at the time of the appointment.

## 2023-06-04 NOTE — Telephone Encounter (Signed)
 Please review labs from January 2025.  Only thing not checked would be a CMET.  Does she still need pre-physical labs?

## 2023-06-05 ENCOUNTER — Encounter: Payer: Self-pay | Admitting: *Deleted

## 2023-06-05 NOTE — Addendum Note (Signed)
 Addended by: Kerby Nora E on: 06/05/2023 01:52 PM   Modules accepted: Orders

## 2023-06-05 NOTE — Telephone Encounter (Signed)
 Please place future orders for [facility].  Not sure what labs are needed for PCOS.

## 2023-06-05 NOTE — Telephone Encounter (Signed)
 Let patient know I have ordered the labs as requested but I am not sure her insurance will pay for them to be checked again so soon.  She may have to pay out-of-pocket.

## 2023-06-10 NOTE — Telephone Encounter (Signed)
 Please advise how long she should push out her CPE so she can get repeat labs.

## 2023-06-17 ENCOUNTER — Other Ambulatory Visit: Payer: 59

## 2023-06-24 ENCOUNTER — Encounter: Payer: Self-pay | Admitting: Family Medicine

## 2023-06-25 ENCOUNTER — Encounter: Payer: 59 | Admitting: Family Medicine

## 2023-07-04 ENCOUNTER — Other Ambulatory Visit: Payer: Self-pay | Admitting: Family Medicine

## 2023-07-16 ENCOUNTER — Telehealth: Payer: Self-pay | Admitting: *Deleted

## 2023-07-16 ENCOUNTER — Encounter: Payer: Self-pay | Admitting: *Deleted

## 2023-07-16 ENCOUNTER — Other Ambulatory Visit (HOSPITAL_COMMUNITY): Payer: Self-pay

## 2023-07-16 MED ORDER — TIRZEPATIDE-WEIGHT MANAGEMENT 2.5 MG/0.5ML ~~LOC~~ SOLN: 2.5000 mg | SUBCUTANEOUS | 1 refills | Status: DC

## 2023-07-16 NOTE — Telephone Encounter (Signed)
 I thought I had already done this but I went ahead and sent it in again to her local pharmacy.

## 2023-07-16 NOTE — Telephone Encounter (Signed)
 MyChart message sent to Brittany [name] in regards to coverage.

## 2023-07-16 NOTE — Telephone Encounter (Signed)
 Please send in  Rx for tirzepatide so we can try and get that approved for patient.  I think it will work better if Rx is sent in first.

## 2023-07-16 NOTE — Telephone Encounter (Signed)
 Copied from CRM [id number]. Topic: Clinical - Prescription Issue >> Jul 16, 2023  8:38 AM Brittany [name] wrote: Reason for CRM: pt called to speak with her provider regarding Manjaro medication . She states she needs a prior authorization for the medication. Please call pt's insurance at [phone] Pt is also requesting a call back at [phone]

## 2023-07-16 NOTE — Telephone Encounter (Signed)
 Mounjaro is only approved for use in type 2 diabetics. Zepbound is the approved weight loss version of Mounjaro, however, pt's insurance expects pt to pay full price for med ($1035.19). No PA submitted at this time.

## 2023-07-16 NOTE — Telephone Encounter (Signed)
 Please take patient's comments below into consideration and resubmit PA.  Patient has PCOS

## 2023-07-16 NOTE — Telephone Encounter (Signed)
 Please submit PA for [facility].

## 2023-07-16 NOTE — Telephone Encounter (Signed)
 Patient is not diabetic.  Patient has [facility] that does not cover weight loss medication so patient would have to pay out of pocket for.

## 2023-07-31 ENCOUNTER — Telehealth: Admitting: Family Medicine

## 2023-07-31 DIAGNOSIS — E282 Polycystic ovarian syndrome: Secondary | ICD-10-CM | POA: Diagnosis not present

## 2023-07-31 MED ORDER — TIRZEPATIDE-WEIGHT MANAGEMENT 2.5 MG/0.5ML ~~LOC~~ SOLN: 2.5000 mg | SUBCUTANEOUS | 1 refills | Status: DC

## 2023-07-31 NOTE — Progress Notes (Signed)
 VIRTUAL VISIT A virtual visit is felt to be most appropriate for this patient at this time.   I connected with the patient on 07/31/23 at 11:20 AM EDT by virtual telehealth platform and verified that I am speaking with the correct person using two identifiers.   I discussed the limitations, risks, security and privacy concerns of performing an evaluation and management service by  virtual telehealth platform and the availability of in person appointments. I also discussed with the patient that there may be a patient responsible charge related to this service. The patient expressed understanding and agreed to proceed.  Patient location: Home Provider Location:  [place] Participants: Herby Lolling and Janalee Mcmurray   Chief Complaint  Patient presents with   Follow-up    3 month followup. Concerned about PCOS, weight gain and blood pressure.     History of Present Illness:  48 y.o. female patient of Arlethia Basso E, MD presents for 3 month follow up.   PCOS now back on OCP, did not restart metformin  500 mfg XR daily .Aaron Aas Had not restarted this as she was looking into Mounjaro start. In past Metfomrin caused  hypoglycemia. She has been considering Mounjaro/Ozempic... PA appeal in process per notes with added info of PCOS.  BP higher than usual for her,  today's BP 130/85  Some peripheral open you are talking about that I think the self-pay edema.  Having some knee pain.. plans to follow up with ORTHO.Aaron Aas doing walking daily. BP Readings from Last 3 Encounters:  07/31/23 127/82  05/24/23 100/70  11/01/22 116/66   Wt Readings from Last 3 Encounters:  07/31/23 282 lb (127.9 kg)  05/24/23 282 lb 6 oz (128.1 kg)  04/17/23 272 lb (123.4 kg)   She is interested in repeating fasting labs CMET, lipids   COVID 19 screen No recent travel or known exposure to COVID19 The patient denies respiratory symptoms of COVID 19 at this time.  The importance of social distancing was discussed  today.   Review of Systems  Constitutional:  Negative for chills and fever.  HENT:  Negative for congestion and ear pain.   Eyes:  Negative for pain and redness.  Respiratory:  Negative for cough and shortness of breath.   Cardiovascular:  Negative for chest pain, palpitations and leg swelling.  Gastrointestinal:  Negative for abdominal pain, blood in stool, constipation, diarrhea, nausea and vomiting.  Genitourinary:  Negative for dysuria.  Musculoskeletal:  Negative for falls and myalgias.  Skin:  Negative for rash.  Neurological:  Negative for dizziness.  Psychiatric/Behavioral:  Negative for depression. The patient is not nervous/anxious.       Past Medical History:  Diagnosis Date   ADD (attention deficit disorder)    Adenomyosis    Anemia    Asthma    Celiac disease    Diverticulitis    Diverticulosis    Heart murmur    Ovarian cyst    PCOS (polycystic ovarian syndrome)     reports that she has never smoked. She has never used smokeless tobacco. She reports current alcohol use. She reports that she does not use drugs.   Current Outpatient Medications:    ACCU-CHEK GUIDE TEST test strip, USE AS INSTRUCTED FOR HYPOGLYCEMIA E16.2, Disp: 100 strip, Rfl: 3   Accu-Chek Softclix Lancets lancets, Use as instructed for hypoglycemia, Disp: 100 each, Rfl: 12   albuterol  (PROVENTIL ) (2.5 MG/3ML) 0.083% nebulizer solution, TAKE 3 MLS BY NEBULIZATION EVERY 6 (SIX) HOURS AS NEEDED FOR WHEEZING  OR SHORTNESS OF BREATH., Disp: 150 mL, Rfl: 2   albuterol  (VENTOLIN  HFA) 108 (90 Base) MCG/ACT inhaler, TAKE 2 PUFFS BY MOUTH EVERY 6 HOURS AS NEEDED FOR WHEEZE OR SHORTNESS OF BREATH, Disp: 18 each, Rfl: 2   Budesonide (PULMICORT FLEXHALER) 90 MCG/ACT inhaler, INHALE 1 PUFF BY MOUTH TWICE A DAY, Disp: 1 each, Rfl: 2   EPINEPHRINE  0.3 mg/0.3 mL IJ SOAJ injection, INJECT AS DIRECTED, Disp: 2 each, Rfl: 0   L-Lysine 1000 MG TABS, Take 2 tablets by mouth daily., Disp: , Rfl:    LOW-OGESTREL 0.3-30  MG-MCG tablet, Take 1 tablet by mouth daily., Disp: , Rfl:    metFORMIN  (GLUCOPHAGE -XR) 500 MG 24 hr tablet, Take 1 tablet (500 mg total) by mouth daily with breakfast., Disp: 30 tablet, Rfl: 3   methylphenidate  (METADATE  CD) 30 MG CR capsule, Take 1 capsule (30 mg total) by mouth every morning. Fill after 07/07/2023, Disp: 30 capsule, Rfl: 0   methylphenidate  (METADATE  CD) 30 MG CR capsule, Take 1 capsule (30 mg total) by mouth every morning. FIll after 06/06/2023, Disp: 30 capsule, Rfl: 0   methylphenidate  (RITALIN ) 5 MG tablet, Take 1-2 tablets around noon as needed for concentration later in the day, Disp: 60 tablet, Rfl: 0   methylphenidate  (RITALIN ) 5 MG tablet, Take 1-2 tablet around noon as needed for concentration later in the day, Disp: 60 tablet, Rfl: 0   methylphenidate  (RITALIN ) 5 MG tablet, Take 2 tablets in AM with Metadate  CD 30 mg and additional 2 tablet around noon  as needed for concentration later in the day., Disp: 120 tablet, Rfl: 0   methylphenidate  (RITALIN ) 5 MG tablet, Take 2 tablets in AM with Metadate  CD 30 mg and additional 2 tablet around noon  as needed for concentration later in the day., Disp: 120 tablet, Rfl: 0   montelukast  (SINGULAIR ) 10 MG tablet, TAKE 1 TABLET BY MOUTH EVERYDAY AT BEDTIME, Disp: 90 tablet, Rfl: 0   Omega-3 1000 MG CAPS, Take 1,000 mg by mouth daily., Disp: , Rfl:    QUERCETIN PO, Take 1 tablet by mouth daily., Disp: , Rfl:    tirzepatide (ZEPBOUND) 2.5 MG/0.5ML injection vial, Inject 2.5 mg into the skin once a week., Disp: 2 mL, Rfl: 1   Turmeric 500 MG TABS, Take by mouth. Take 2-3 tablets by mouth daily, Disp: , Rfl:    VITAMIN D  PO, 25 mcg. Take 1/2 tablet daily, Disp: , Rfl:    zinc gluconate 50 MG tablet, Take 25 mg by mouth daily., Disp: , Rfl:    Observations/Objective: There were no vitals taken for this visit.  Physical Exam Constitutional:      General: The patient is not in acute distress. Pulmonary:     Effort: Pulmonary effort  is normal. No respiratory distress.  Neurological:     Mental Status: The patient is alert and oriented to person, place, and time.  Psychiatric:        Mood and Affect: Mood normal.        Behavior: Behavior normal.    Assessment and Plan Severe obesity (BMI 35.0-39.9) with comorbidity (HCC)  PCOS (polycystic ovarian syndrome) -     Hemoglobin A1c; Future -     Lipid panel; Future -     Comprehensive metabolic panel with GFR; Future  Other orders -     Tirzepatide-Weight Management; Inject 2.5 mg into the skin once a week.  Dispense: 2 mL; Refill: 1   We are continuing to await insurance  PA with new information of PCOS for coverage of Mounjaro.  This has been taking quite some time. I will contact the PA department to determine where we are with this. Zepbound/Wegovy not covered by her insurance.  She wishes to go ahead and move forward with the self-pay option for Mounjaro.  A prescription to start 2.5 mg weekly was sent to [employer] [facility] self-pay.   I discussed the assessment and treatment plan with the patient. The patient was provided an opportunity to ask questions and all were answered. The patient agreed with the plan and demonstrated an understanding of the instructions.   She continues to work on healthy eating and regular exercise but is limited given her knee issues chronically.  She will be due for repeat cholesterol, diabetes screen and complete metabolic panel at the end of June prior to her physical.  The patient was advised to call back or seek an in-person evaluation if the symptoms worsen or if the condition fails to improve as anticipated.     Herby Lolling, MD

## 2023-08-01 ENCOUNTER — Telehealth: Payer: Self-pay

## 2023-08-01 NOTE — Telephone Encounter (Signed)
 Noted.  Patient will be getting tirzepatide from [facility].

## 2023-08-01 NOTE — Telephone Encounter (Signed)

## 2023-08-04 ENCOUNTER — Other Ambulatory Visit: Payer: Self-pay | Admitting: Family Medicine

## 2023-08-05 NOTE — Telephone Encounter (Signed)
 Called  pt and schedule a appt for cpe / labs

## 2023-08-05 NOTE — Telephone Encounter (Signed)
 Please call and schedule CPE with fasting labs prior for Dr. Cherlyn Cornet around 09/16/2023.

## 2023-08-14 MED ORDER — METHYLPHENIDATE HCL ER (CD) 40 MG PO CPCR: 40.0000 mg | ORAL_CAPSULE | ORAL | 0 refills | Status: DC

## 2023-08-14 NOTE — Addendum Note (Signed)
 Addended by: Wyn Heater on: 08/14/2023 01:10 PM   Modules accepted: Orders

## 2023-08-14 NOTE — Addendum Note (Signed)
 Addended by: Wyn Heater on: 08/14/2023 09:18 AM   Modules accepted: Orders

## 2023-08-14 NOTE — Telephone Encounter (Signed)
 Last office visit 07/31/2023 (video) for Sever Obesity and PCOS.  Last refilled 05/24/2023 for #30 with no refills x 3 months.  Next appt: CPE 09/19/2023

## 2023-08-28 ENCOUNTER — Other Ambulatory Visit: Payer: Self-pay | Admitting: Family Medicine

## 2023-09-09 ENCOUNTER — Other Ambulatory Visit (INDEPENDENT_AMBULATORY_CARE_PROVIDER_SITE_OTHER)

## 2023-09-09 DIAGNOSIS — E282 Polycystic ovarian syndrome: Secondary | ICD-10-CM

## 2023-09-09 NOTE — Addendum Note (Signed)
 Addended by: Gerry Krone on: 09/09/2023 07:43 AM   Modules accepted: Orders

## 2023-09-09 NOTE — Addendum Note (Signed)
 Addended by: Gerry Krone on: 09/09/2023 07:34 AM   Modules accepted: Orders

## 2023-09-10 LAB — COMPREHENSIVE METABOLIC PANEL WITH GFR
AG Ratio: 1.5 (calc) (ref 1.0–2.5)
ALT: 14 U/L (ref 6–29)
AST: 22 U/L (ref 10–35)
Albumin: 4.2 g/dL (ref 3.6–5.1)
Alkaline phosphatase (APISO): 46 U/L (ref 31–125)
BUN/Creatinine Ratio: 11 (calc) (ref 6–22)
BUN: 11 mg/dL (ref 7–25)
CO2: 22 mmol/L (ref 20–32)
Calcium: 8.8 mg/dL (ref 8.6–10.2)
Chloride: 106 mmol/L (ref 98–110)
Creat: 1.02 mg/dL — ABNORMAL HIGH (ref 0.50–0.99)
Globulin: 2.8 g/dL (ref 1.9–3.7)
Glucose, Bld: 78 mg/dL (ref 65–99)
Potassium: 4.4 mmol/L (ref 3.5–5.3)
Sodium: 138 mmol/L (ref 135–146)
Total Bilirubin: 0.7 mg/dL (ref 0.2–1.2)
Total Protein: 7 g/dL (ref 6.1–8.1)
eGFR: 69 mL/min/{1.73_m2} (ref >=60)

## 2023-09-10 LAB — LIPID PANEL
Cholesterol: 159 mg/dL (ref <200)
HDL: 56 mg/dL (ref >=50)
LDL Cholesterol (Calc): 89 mg/dL
Non-HDL Cholesterol (Calc): 103 mg/dL (ref <130)
Total CHOL/HDL Ratio: 2.8 (calc) (ref <5.0)
Triglycerides: 54 mg/dL (ref <150)

## 2023-09-10 LAB — HEMOGLOBIN A1C
Hgb A1c MFr Bld: 4.9 % (ref <5.7)
Mean Plasma Glucose: 94 mg/dL
eAG (mmol/L): 5.2 mmol/L

## 2023-09-12 ENCOUNTER — Ambulatory Visit: Payer: Self-pay | Admitting: Family Medicine

## 2023-09-12 NOTE — Progress Notes (Signed)
 No critical labs need to be addressed urgently. We will discuss labs in detail at upcoming office visit.

## 2023-09-13 LAB — TESTOS,TOTAL,FREE AND SHBG (FEMALE)
Free Testosterone: 0.8 pg/mL (ref 0.1–6.4)
Sex Hormone Binding: 90 nmol/L (ref 17–124)
Testosterone, Total, LC-MS-MS: 10 ng/dL (ref 2–45)

## 2023-09-13 LAB — FOLLICLE STIMULATING HORMONE: FSH: 1.7 m[IU]/mL

## 2023-09-13 LAB — LUTEINIZING HORMONE: LH: 2 m[IU]/mL

## 2023-09-16 ENCOUNTER — Other Ambulatory Visit (HOSPITAL_COMMUNITY): Payer: Self-pay

## 2023-09-16 ENCOUNTER — Other Ambulatory Visit: Payer: Self-pay | Admitting: Family

## 2023-09-16 ENCOUNTER — Telehealth: Payer: Self-pay

## 2023-09-16 MED ORDER — TIRZEPATIDE-WEIGHT MANAGEMENT 5 MG/0.5ML ~~LOC~~ SOLN: 5.0000 mg | SUBCUTANEOUS | 0 refills | Status: DC

## 2023-09-16 NOTE — Telephone Encounter (Signed)
 Pt A1C is 4.9 Ozempic/Mounjaro  is approved exclusively as an adjunct to diet and exercise to improve glycemic  control in adults with type 2 diabetes mellitus. A review of patient's medical chart reveals no  documented diagnosis of type 2 diabetes or an A1C indicative of diabetes. Therefore, they do not  currently meet the criteria for prior authorization of this medication. If clinically appropriate, alternative  options such as Saxenda, Zepbound , or Georjean may be considered for this patient.

## 2023-09-16 NOTE — Telephone Encounter (Signed)
 Patient is currently getting Zepbound  from [facility].

## 2023-09-18 ENCOUNTER — Telehealth: Payer: Self-pay | Admitting: Family Medicine

## 2023-09-18 MED ORDER — TIRZEPATIDE-WEIGHT MANAGEMENT 5 MG/0.5ML ~~LOC~~ SOLN: 5.0000 mg | SUBCUTANEOUS | 0 refills | Status: DC

## 2023-09-18 NOTE — Addendum Note (Signed)
 Addended by: WENDELL ARLAND RAMAN on: 09/18/2023 11:06 AM   Modules accepted: Orders

## 2023-09-18 NOTE — Telephone Encounter (Signed)
 Mounjaro  5 mg prescription was sent to [facility] which was the wrong pharmacy.  I have resent to [facility].  I have responded to patient's MyChart message in regards to this.

## 2023-09-18 NOTE — Telephone Encounter (Signed)
 Patient stated can you give her a call about her medications.

## 2023-09-19 ENCOUNTER — Ambulatory Visit (INDEPENDENT_AMBULATORY_CARE_PROVIDER_SITE_OTHER): Admitting: Family Medicine

## 2023-09-19 ENCOUNTER — Encounter: Payer: Self-pay | Admitting: Family Medicine

## 2023-09-19 VITALS — BP 112/78 | HR 71 | Temp 98.0°F | Resp 20 | Ht 68.25 in | Wt 288.0 lb

## 2023-09-19 DIAGNOSIS — Z0000 Encounter for general adult medical examination without abnormal findings: Secondary | ICD-10-CM | POA: Diagnosis not present

## 2023-09-19 DIAGNOSIS — J4540 Moderate persistent asthma, uncomplicated: Secondary | ICD-10-CM

## 2023-09-19 DIAGNOSIS — R41840 Attention and concentration deficit: Secondary | ICD-10-CM | POA: Diagnosis not present

## 2023-09-19 DIAGNOSIS — K900 Celiac disease: Secondary | ICD-10-CM | POA: Diagnosis not present

## 2023-09-19 DIAGNOSIS — E282 Polycystic ovarian syndrome: Secondary | ICD-10-CM

## 2023-09-19 DIAGNOSIS — E7800 Pure hypercholesterolemia, unspecified: Secondary | ICD-10-CM

## 2023-09-19 NOTE — Progress Notes (Signed)
 Patient ID: Brittany [name], female    DOB: 1975-11-19, 48 y.o.   MRN: [id number]  This visit was conducted in person.  BP 112/78   Pulse 71   Temp 98 F (36.7 C)   Resp 20   Ht 5' 8.25 (1.734 m)   Wt 288 lb (130.6 kg)   LMP 09/11/2023 (Exact Date)   SpO2 99%   BMI 43.47 kg/m    CC:  Chief Complaint  Patient presents with   Annual Exam    Subjective:   HPI: Brittany [name] is a 48 y.o. female presenting on 09/19/2023 for Annual Exam The patient presents for annual medicare wellness, complete physical and review of chronic health problems. He/She also has the following acute concerns today:   Followed by  GYN   Elevated Cholesterol:  Lab Results  Component Value Date   CHOL 159 09/09/2023   HDL 56 09/09/2023   LDLCALC 89 09/09/2023   TRIG 54 09/09/2023   CHOLHDL 2.8 09/09/2023  The 10-year ASCVD risk score (Arnett DK, et al., 2019) is: 0.5%   Values used to calculate the score:     Age: 67 years     Clincally relevant sex: Female     Is Non-Hispanic African American: Yes     Diabetic: No     Tobacco smoker: No     Systolic Blood Pressure: 112 mmHg     Is BP treated: No     HDL Cholesterol: 56 mg/dL     Total Cholesterol: 159 mg/dL Using medications without problems: Muscle aches:  Diet compliance: healthy eating habits Exercise: none with  knee issue. Other complaints:  Obesity/PCOS:  Wt Readings from Last 3 Encounters:  09/19/23 288 lb (130.6 kg)  07/31/23 282 lb (127.9 kg)  05/24/23 282 lb 6 oz (128.1 kg)   Body mass index is 43.47 kg/m.  BP Readings from Last 3 Encounters:  09/19/23 112/78  07/31/23 127/82  05/24/23 100/70   ADD, well controlled on methylphenidate  CD 30 with an additional 5 mg short acting later in the day if needed.    Celiac disease: followed by GI.  Moderate persistent asthma: No flares, using  QVAR  but not daily,  albuterol  prn rarely.  Has noted some issue catching her breath at times.. may be due to weight  gain.  Cholesterol:  improved LDL in last  6 months. Lab Results  Component Value Date   CHOL 159 09/09/2023   HDL 56 09/09/2023   LDLCALC 89 09/09/2023   TRIG 54 09/09/2023   CHOLHDL 2.8 09/09/2023   The 10-year ASCVD risk score (Arnett DK, et al., 2019) is: 0.5%   Values used to calculate the score:     Age: 67 years     Clincally relevant sex: Female     Is Non-Hispanic African American: Yes     Diabetic: No     Tobacco smoker: No     Systolic Blood Pressure: 112 mmHg     Is BP treated: No     HDL Cholesterol: 56 mg/dL     Total Cholesterol: 159 mg/dL Using medications without problems: Muscle aches:  Diet compliance: gluten free, low carb Exercise: yes Other complaints:   PCOS   (not currently on metformin  500 mg ER daily ) LH to [facility] ratio: 2/1.7: 1.17... compared to 5.7 6 months ago   On Zepbound  2.5 mg weekly, no SE.Brittany [name]. now Starting 5 mg weekly Zepbound  from [employer] direct.  No change in appetite.  Relevant past medical, surgical, family and social history reviewed and updated as indicated. Interim medical history since our last visit reviewed. Allergies and medications reviewed and updated. Outpatient Medications Prior to Visit  Medication Sig Dispense Refill   ACCU-CHEK GUIDE TEST test strip USE AS INSTRUCTED FOR HYPOGLYCEMIA E16.2 100 strip 3   Accu-Chek Softclix Lancets lancets Use as instructed for hypoglycemia 100 each 12   albuterol  (PROVENTIL ) (2.5 MG/3ML) 0.083% nebulizer solution TAKE 3 MLS BY NEBULIZATION EVERY 6 (SIX) HOURS AS NEEDED FOR WHEEZING OR SHORTNESS OF BREATH. 150 mL 2   albuterol  (VENTOLIN  HFA) 108 (90 Base) MCG/ACT inhaler TAKE 2 PUFFS BY MOUTH EVERY 6 HOURS AS NEEDED FOR WHEEZE OR SHORTNESS OF BREATH 18 each 2   Budesonide (PULMICORT FLEXHALER) 90 MCG/ACT inhaler INHALE 1 PUFF BY MOUTH TWICE A DAY 1 each 2   EPINEPHRINE  0.3 mg/0.3 mL IJ SOAJ injection INJECT AS DIRECTED 2 each 0   L-Lysine 1000 MG TABS Take 2 tablets by mouth daily.      LOW-OGESTREL 0.3-30 MG-MCG tablet Take 1 tablet by mouth daily.     metFORMIN  (GLUCOPHAGE -XR) 500 MG 24 hr tablet TAKE 1 TABLET BY MOUTH EVERY DAY WITH BREAKFAST 90 tablet 1   methylphenidate  (METADATE  CD) 40 MG CR capsule Take 1 capsule (40 mg total) by mouth every morning. FIll after 09/14/2023 30 capsule 0   methylphenidate  (METADATE  CD) 40 MG CR capsule Take 1 capsule (40 mg total) by mouth every morning. Fill after 10/14/2023 30 capsule 0   methylphenidate  (METADATE  CD) 40 MG CR capsule Take 1 capsule (40 mg total) by mouth every morning. 30 capsule 0   methylphenidate  (RITALIN ) 5 MG tablet Take 1-2 tablets around noon as needed for concentration later in the day 60 tablet 0   methylphenidate  (RITALIN ) 5 MG tablet Take 2 tablets in AM with Metadate  CD 30 mg and additional 2 tablet around noon  as needed for concentration later in the day. 120 tablet 0   montelukast  (SINGULAIR ) 10 MG tablet TAKE 1 TABLET BY MOUTH EVERYDAY AT BEDTIME 90 tablet 0   Omega-3 1000 MG CAPS Take 1,000 mg by mouth daily.     QUERCETIN PO Take 1 tablet by mouth daily.     tirzepatide  5 MG/0.5ML injection vial Inject 5 mg into the skin once a week. 2 mL 0   Turmeric 500 MG TABS Take by mouth. Take 2-3 tablets by mouth daily     VITAMIN D  PO 25 mcg. Take 1/2 tablet daily     zinc gluconate 50 MG tablet Take 25 mg by mouth daily.     methylphenidate  (RITALIN ) 5 MG tablet Take 1-2 tablet around noon as needed for concentration later in the day 60 tablet 0   methylphenidate  (RITALIN ) 5 MG tablet Take 2 tablets in AM with Metadate  CD 30 mg and additional 2 tablet around noon  as needed for concentration later in the day. 120 tablet 0   No facility-administered medications prior to visit.     Per HPI unless specifically indicated in ROS section below Review of Systems  Constitutional:  Negative for fatigue and fever.  HENT:  Negative for congestion.   Eyes:  Negative for pain.  Respiratory:  Negative for cough and  shortness of breath.   Cardiovascular:  Negative for chest pain, palpitations and leg swelling.  Gastrointestinal:  Negative for abdominal pain.  Genitourinary:  Negative for dysuria and vaginal bleeding.  Musculoskeletal:  Negative for back pain.  Neurological:  Negative  for syncope, light-headedness and headaches.  Psychiatric/Behavioral:  Negative for dysphoric mood.    Objective:  BP 112/78   Pulse 71   Temp 98 F (36.7 C)   Resp 20   Ht 5' 8.25 (1.734 m)   Wt 288 lb (130.6 kg)   LMP 09/11/2023 (Exact Date)   SpO2 99%   BMI 43.47 kg/m   Wt Readings from Last 3 Encounters:  09/19/23 288 lb (130.6 kg)  07/31/23 282 lb (127.9 kg)  05/24/23 282 lb 6 oz (128.1 kg)      Physical Exam Vitals and nursing note reviewed.  Constitutional:      General: She is not in acute distress.    Appearance: Normal appearance. She is well-developed. She is obese. She is not ill-appearing or toxic-appearing.  HENT:     Head: Normocephalic.     Right Ear: Hearing, tympanic membrane, ear canal and external ear normal.     Left Ear: Hearing, tympanic membrane, ear canal and external ear normal.     Nose: Nose normal.   Eyes:     General: Lids are normal. Lids are everted, no foreign bodies appreciated.     Conjunctiva/sclera: Conjunctivae normal.     Pupils: Pupils are equal, round, and reactive to light.   Neck:     Thyroid : No thyroid  mass or thyromegaly.     Vascular: No carotid bruit.     Trachea: Trachea normal.   Cardiovascular:     Rate and Rhythm: Normal rate and regular rhythm.     Heart sounds: Normal heart sounds, S1 normal and S2 normal. No murmur heard.    No gallop.  Pulmonary:     Effort: Pulmonary effort is normal. No respiratory distress.     Breath sounds: Normal breath sounds. No wheezing, rhonchi or rales.  Abdominal:     General: Bowel sounds are normal. There is no distension or abdominal bruit.     Palpations: Abdomen is soft. There is no fluid wave or mass.      Tenderness: There is no abdominal tenderness. There is no guarding or rebound.     Hernia: No hernia is present.   Musculoskeletal:     Cervical back: Normal range of motion and neck supple.  Lymphadenopathy:     Cervical: No cervical adenopathy.   Skin:    General: Skin is warm and dry.     Findings: No rash.   Neurological:     Mental Status: She is alert.     Cranial Nerves: No cranial nerve deficit.     Sensory: No sensory deficit.   Psychiatric:        Mood and Affect: Mood is not anxious or depressed.        Speech: Speech normal.        Behavior: Behavior normal. Behavior is cooperative.        Judgment: Judgment normal.       Results for orders placed or performed in visit on 09/09/23  Hemoglobin A1c   Collection Time: 09/09/23  7:36 AM  Result Value Ref Range   Hgb A1c MFr Bld 4.9 <5.7 %   Mean Plasma Glucose 94 mg/dL   eAG (mmol/L) 5.2 mmol/L  Lipid panel   Collection Time: 09/09/23  7:36 AM  Result Value Ref Range   Cholesterol 159 <200 mg/dL   HDL 56 > OR = 50 mg/dL   Triglycerides 54 <849 mg/dL   LDL Cholesterol (Calc) 89 mg/dL (calc)   Total CHOL/HDL  Ratio 2.8 <5.0 (calc)   Non-HDL Cholesterol (Calc) 103 <130 mg/dL (calc)  Comprehensive metabolic panel with GFR   Collection Time: 09/09/23  7:36 AM  Result Value Ref Range   Glucose, Bld 78 65 - 99 mg/dL   BUN 11 7 - 25 mg/dL   Creat 8.97 (H) 9.49 - 0.99 mg/dL   eGFR 69 > OR = 60 fO/fpw/8.26f7   BUN/Creatinine Ratio 11 6 - 22 (calc)   Sodium 138 135 - 146 mmol/L   Potassium 4.4 3.5 - 5.3 mmol/L   Chloride 106 98 - 110 mmol/L   CO2 22 20 - 32 mmol/L   Calcium 8.8 8.6 - 10.2 mg/dL   Total Protein 7.0 6.1 - 8.1 g/dL   Albumin 4.2 3.6 - 5.1 g/dL   Globulin 2.8 1.9 - 3.7 g/dL (calc)   AG Ratio 1.5 1.0 - 2.5 (calc)   Total Bilirubin 0.7 0.2 - 1.2 mg/dL   Alkaline phosphatase (APISO) 46 31 - 125 U/L   AST 22 10 - 35 U/L   ALT 14 6 - 29 U/L  Follicle stimulating hormone   Collection Time:  09/09/23  7:44 AM  Result Value Ref Range   FSH 1.7 mIU/mL  Luteinizing hormone   Collection Time: 09/09/23  7:44 AM  Result Value Ref Range   LH 2.0 mIU/mL  Testos,Total,Free and SHBG (Female)   Collection Time: 09/09/23  7:44 AM  Result Value Ref Range   Testosterone, Total, LC-MS-MS 10 2 - 45 ng/dL   Free Testosterone 0.8 0.1 - 6.4 pg/mL   Sex Hormone Binding 90 17 - 124 nmol/L    This visit occurred during the SARS-CoV-2 public health emergency.  Safety protocols were in place, including screening questions prior to the visit, additional usage of staff PPE, and extensive cleaning of exam room while observing appropriate contact time as indicated for disinfecting solutions.   COVID 19 screen:  No recent travel or known exposure to COVID19 The patient denies respiratory symptoms of COVID 19 at this time. The importance of social distancing was discussed today.   Assessment and Plan The patient's preventative maintenance and recommended screening tests for an annual wellness exam were reviewed in full today. Brought up to date unless services declined.  Counselled on the importance of diet, exercise, and its role in overall health and mortality. The patient's FH and SH was reviewed, including their home life, tobacco status, and drug and alcohol status.   Vaccines:  COVID x 4, past flu season,  Tdap 2023 Pap/DVE:  GYN 02/2023 Mammo: no early family history of breast cancer, 02/2023 neg.Brittany [name]  Per GYN..  Colon: no early family history of colon cancer,  Dr Shila 09/2021, repeat in 10 years. Smoking Status:none ETOH/ drug use  occ/none nonsmoker  HIV screen:   refused   Problem List Items Addressed This Visit     ADD (attention deficit disorder)   Chronic, she is doing well on current regimen  Will continue Metadate  CD30 milligrams in the morning with an additional 5 to 10 mg of Ritalin  at the same time as well as later in the day for continued symptoms. PDMP reviewed.        Celiac disease   Followed by GI.      Moderate persistent asthma in adult without complication   No flares, using  QVAR  daily,  albuterol  prn rarely.       PCOS (polycystic ovarian syndrome)    LH/FSH ratio improved after starting Zepbound .. planning to  increase to 5 mg weekly.  Follow up with lab eval in 3 months per pt request.      Pure hypercholesterolemia   Chronic, well-controlled  ASCVD 10-year risk 0.5%       Severe obesity (BMI 35.0-39.9) with comorbidity (HCC)    Chronic,  no change on Zepbound  2.5 mg weekly, now increasing to 5 mg weekly. No SE.  Encouraged exercise, weight loss, healthy eating habits.       Other Visit Diagnoses       Routine general medical examination at a health care facility    -  Primary       No orders of the defined types were placed in this encounter.   Greig Ring, MD

## 2023-09-19 NOTE — Assessment & Plan Note (Signed)
 Chronic, well-controlled  ASCVD 10-year risk 0.5%

## 2023-09-19 NOTE — Patient Instructions (Signed)
 Try to increase Qvar  to daily to see if improvement in  SOB and decrease albuterol  use.

## 2023-09-19 NOTE — Assessment & Plan Note (Signed)
 Followed by GI

## 2023-09-19 NOTE — Assessment & Plan Note (Addendum)
 LH/FSH ratio improved after starting Zepbound .. planning to increase to 5 mg weekly.  Follow up with lab eval in 3 months per pt request.

## 2023-09-19 NOTE — Assessment & Plan Note (Signed)
 Chronic, she is doing well on current regimen  Will continue Metadate  CD30 milligrams in the morning with an additional 5 to 10 mg of Ritalin  at the same time as well as later in the day for continued symptoms. PDMP reviewed.

## 2023-09-19 NOTE — Assessment & Plan Note (Addendum)
 Chronic,  no change on Zepbound  2.5 mg weekly, now increasing to 5 mg weekly. No SE.  Encouraged exercise, weight loss, healthy eating habits.

## 2023-09-19 NOTE — Assessment & Plan Note (Signed)
No flares, using  QVAR daily,  albuterol prn rarely.  

## 2023-10-07 ENCOUNTER — Encounter: Payer: Self-pay | Admitting: Family Medicine

## 2023-10-07 DIAGNOSIS — J4540 Moderate persistent asthma, uncomplicated: Secondary | ICD-10-CM

## 2023-10-08 ENCOUNTER — Other Ambulatory Visit: Payer: Self-pay | Admitting: Family Medicine

## 2023-10-08 DIAGNOSIS — R0602 Shortness of breath: Secondary | ICD-10-CM

## 2023-10-08 NOTE — Telephone Encounter (Signed)
 Last office visit 09/19/2023 for CPE.  Last refilled 09/18/2023 for 2 ml with no refills.  Next Appt: No future appointments.  Refill?

## 2023-10-10 ENCOUNTER — Other Ambulatory Visit: Payer: Self-pay | Admitting: Family Medicine

## 2023-10-10 ENCOUNTER — Encounter: Payer: Self-pay | Admitting: Family Medicine

## 2023-10-10 NOTE — Telephone Encounter (Signed)
 Unfortunately we are not able to do those in our office. I think they will need to be sent to Pulmonology

## 2023-10-11 MED ORDER — PULMICORT FLEXHALER 90 MCG/ACT IN AEPB: INHALATION_SPRAY | RESPIRATORY_TRACT | 2 refills | Status: DC

## 2023-10-11 NOTE — Addendum Note (Signed)
 Addended by: AVELINA NO E on: 10/11/2023 05:09 PM   Modules accepted: Orders

## 2023-10-14 ENCOUNTER — Telehealth: Payer: Self-pay | Admitting: Pulmonary Disease

## 2023-10-14 NOTE — Telephone Encounter (Signed)
 LVMTCB to schedule pulmonary consult.

## 2023-10-17 MED ORDER — ZEPBOUND 7.5 MG/0.5ML ~~LOC~~ SOLN: 7.5000 mg | SUBCUTANEOUS | 0 refills | Status: DC

## 2023-10-17 NOTE — Telephone Encounter (Signed)
 Please sign order below if okay to send in next dose of 7.5 mg to [facility].

## 2023-10-17 NOTE — Addendum Note (Signed)
 Addended by: WENDELL ARLAND RAMAN on: 10/17/2023 08:52 AM   Modules accepted: Orders

## 2023-10-21 NOTE — Telephone Encounter (Signed)
 LVMTCB to schedule pulmonary consult.

## 2023-10-31 ENCOUNTER — Ambulatory Visit: Admitting: Family Medicine

## 2023-10-31 ENCOUNTER — Encounter: Payer: Self-pay | Admitting: Family Medicine

## 2023-10-31 ENCOUNTER — Telehealth (INDEPENDENT_AMBULATORY_CARE_PROVIDER_SITE_OTHER): Admitting: Family Medicine

## 2023-10-31 VITALS — Ht 68.25 in | Wt 288.0 lb

## 2023-10-31 DIAGNOSIS — E162 Hypoglycemia, unspecified: Secondary | ICD-10-CM | POA: Diagnosis not present

## 2023-10-31 DIAGNOSIS — R5383 Other fatigue: Secondary | ICD-10-CM | POA: Diagnosis not present

## 2023-10-31 DIAGNOSIS — E282 Polycystic ovarian syndrome: Secondary | ICD-10-CM | POA: Diagnosis not present

## 2023-10-31 NOTE — Assessment & Plan Note (Addendum)
 Acute, intermittent Initially her symptoms were thought to be secondary to metformin  for PCOS but she continues to have hypoglycemia spells every morning between 8 AM to 1 PM with CBGs down less than 50. She has seen endocrinologist in the past but additional workup was not done because she was on metformin  at that time.  She is concerned about additional possible secondary causes such as adrenal insufficiency, insulinoma and insulin  autoimmune hypoglycemia. She has had some testing in the past revealing continued PCOS and midlevel cortisol levels. We will move forward with rechecking a complete metabolic panel, recheck cortisol, insulin , C-peptide, thyroid  function and prolactin.  She did have thoughts about checking an ACTH level but this is not appropriate.  Would consider ACTH stimulation test if cortisol in abnormal range.  I am not sure we can do this test at our office and she may need to return to endocrinology for full evaluation.  Since specifying

## 2023-10-31 NOTE — Assessment & Plan Note (Addendum)
 Chronic,  no change on Zepbound  5 mg weekly,  No SE. This medication should not affect her blood sugar significantly and she found she was having low blood sugars even when not using Zepbound .  Encouraged exercise, weight loss, healthy eating habits.

## 2023-10-31 NOTE — Progress Notes (Signed)
 VIRTUAL VISIT A virtual visit is felt to be most appropriate for this patient at this time.   I connected with the patient on 10/31/23 at 12:00 PM EDT by virtual telehealth platform and verified that I am speaking with the correct person using two identifiers.   I discussed the limitations, risks, security and privacy concerns of performing an evaluation and management service by  virtual telehealth platform and the availability of in person appointments. I also discussed with the patient that there may be a patient responsible charge related to this service. The patient expressed understanding and agreed to proceed.  Patient location: Home Provider Location: [place] Participants: Brittany [name] and Brittany [name]   Chief Complaint  Patient presents with   Discuss Medication    Pt wants to discuss meds and getting labs    History of Present Illness:  48 y.o. female patient of Brittany Feger E, MD presents for PCOS eval.   PCOS  now on Zepbound  5 mg in last  2 months. Decreased appetite over all, full quicker.  She is interested in re-evaluating PCOS labs on  this medication  She is seeing  nutritionist.   She is having less episodes of low blood sugar ( daily 50 blood sugar only between 8 Am and 1 PM)... keeping it up with  regular diet. Feels like she has been eating constantly to keep up blood sugar.  Noting even  off of metformin . BP Readings from Last 3 Encounters:  09/19/23 112/78  07/31/23 127/82  05/24/23 100/70   Wt Readings from Last 3 Encounters:  10/31/23 288 lb (130.6 kg)  09/19/23 288 lb (130.6 kg)  07/31/23 282 lb (127.9 kg)   Lab Results  Component Value Date   HGBA1C 4.9 09/09/2023    She is interested in repeating fasting labs CMET, lipids   COVID 19 screen No recent travel or known exposure to COVID19 The patient denies respiratory symptoms of COVID 19 at this time.  The importance of social distancing was discussed today.   Review of Systems   Constitutional:  Negative for chills and fever.  HENT:  Negative for congestion and ear pain.   Eyes:  Negative for pain and redness.  Respiratory:  Negative for cough and shortness of breath.   Cardiovascular:  Negative for chest pain, palpitations and leg swelling.  Gastrointestinal:  Negative for abdominal pain, blood in stool, constipation, diarrhea, nausea and vomiting.  Genitourinary:  Negative for dysuria.  Musculoskeletal:  Negative for falls and myalgias.  Skin:  Negative for rash.  Neurological:  Negative for dizziness.  Psychiatric/Behavioral:  Negative for depression. The patient is not nervous/anxious.       Past Medical History:  Diagnosis Date   ADD (attention deficit disorder)    Adenomyosis    Anemia    Asthma    Celiac disease    Diverticulitis    Diverticulosis    Heart murmur    Ovarian cyst    PCOS (polycystic ovarian syndrome)     reports that she has never smoked. She has never used smokeless tobacco. She reports current alcohol use. She reports that she does not use drugs.   Current Outpatient Medications:    ACCU-CHEK GUIDE TEST test strip, USE AS INSTRUCTED FOR HYPOGLYCEMIA E16.2, Disp: 100 strip, Rfl: 3   Accu-Chek Softclix Lancets lancets, Use as instructed for hypoglycemia, Disp: 100 each, Rfl: 12   albuterol  (PROVENTIL ) (2.5 MG/3ML) 0.083% nebulizer solution, TAKE 3 MLS BY NEBULIZATION EVERY 6 (  SIX) HOURS AS NEEDED FOR WHEEZING OR SHORTNESS OF BREATH., Disp: 150 mL, Rfl: 2   albuterol  (VENTOLIN  HFA) 108 (90 Base) MCG/ACT inhaler, TAKE 2 PUFFS BY MOUTH EVERY 6 HOURS AS NEEDED FOR WHEEZE OR SHORTNESS OF BREATH, Disp: 18 each, Rfl: 2   Budesonide  (PULMICORT  FLEXHALER) 90 MCG/ACT inhaler, INHALE 1 PUFF BY MOUTH TWICE A DAY, Disp: 1 each, Rfl: 2   EPINEPHrine  0.3 mg/0.3 mL IJ SOAJ injection, TO BE ADMINISTERED BY PHARMACIST FOR IMMUNIZATION AS DIRECTED, Disp: 2 each, Rfl: 0   L-Lysine 1000 MG TABS, Take 2 tablets by mouth daily., Disp: , Rfl:     LOW-OGESTREL 0.3-30 MG-MCG tablet, Take 1 tablet by mouth daily., Disp: , Rfl:    metFORMIN  (GLUCOPHAGE -XR) 500 MG 24 hr tablet, TAKE 1 TABLET BY MOUTH EVERY DAY WITH BREAKFAST, Disp: 90 tablet, Rfl: 1   methylphenidate  (METADATE  CD) 40 MG CR capsule, Take 1 capsule (40 mg total) by mouth every morning. FIll after 09/14/2023, Disp: 30 capsule, Rfl: 0   methylphenidate  (METADATE  CD) 40 MG CR capsule, Take 1 capsule (40 mg total) by mouth every morning. Fill after 10/14/2023, Disp: 30 capsule, Rfl: 0   methylphenidate  (METADATE  CD) 40 MG CR capsule, Take 1 capsule (40 mg total) by mouth every morning., Disp: 30 capsule, Rfl: 0   methylphenidate  (RITALIN ) 5 MG tablet, Take 1-2 tablets around noon as needed for concentration later in the day, Disp: 60 tablet, Rfl: 0   methylphenidate  (RITALIN ) 5 MG tablet, Take 1-2 tablet around noon as needed for concentration later in the day, Disp: 60 tablet, Rfl: 0   methylphenidate  (RITALIN ) 5 MG tablet, Take 2 tablets in AM with Metadate  CD 30 mg and additional 2 tablet around noon  as needed for concentration later in the day., Disp: 120 tablet, Rfl: 0   methylphenidate  (RITALIN ) 5 MG tablet, Take 2 tablets in AM with Metadate  CD 30 mg and additional 2 tablet around noon  as needed for concentration later in the day., Disp: 120 tablet, Rfl: 0   montelukast  (SINGULAIR ) 10 MG tablet, TAKE 1 TABLET BY MOUTH EVERYDAY AT BEDTIME, Disp: 90 tablet, Rfl: 0   Omega-3 1000 MG CAPS, Take 1,000 mg by mouth daily., Disp: , Rfl:    QUERCETIN PO, Take 1 tablet by mouth daily., Disp: , Rfl:    Turmeric 500 MG TABS, Take by mouth. Take 2-3 tablets by mouth daily, Disp: , Rfl:    VITAMIN D  PO, 25 mcg. Take 1/2 tablet daily, Disp: , Rfl:    ZEPBOUND  7.5 MG/0.5ML injection vial, Inject 7.5 mg into the skin once a week., Disp: 2 mL, Rfl: 0   zinc gluconate 50 MG tablet, Take 25 mg by mouth daily., Disp: , Rfl:    Observations/Objective: There were no vitals taken for this  visit.  Physical Exam Constitutional:      General: The patient is not in acute distress. Pulmonary:     Effort: Pulmonary effort is normal. No respiratory distress.  Neurological:     Mental Status: The patient is alert and oriented to person, place, and time.  Psychiatric:        Mood and Affect: Mood normal.        Behavior: Behavior normal.    Assessment and Plan PCOS (polycystic ovarian syndrome) Assessment & Plan: Chronic, patient requests repeat evaluation of hormone levels including testosterone, SHBG,estrogen, prolactin FSH and LH. She also has concerns about possible congenital adrenal hyperplasia and request 21-hydroxylase and 17 hydroxyprogesterone test.  Orders: -     Luteinizing hormone; Future -     Follicle stimulating hormone; Future -     21-Hydroxylase Antibodies; Future -     17-Hydroxyprogesterone; Future  Severe obesity (BMI 35.0-39.9) with comorbidity (HCC) Assessment & Plan:  Chronic,  no change on Zepbound  5 mg weekly,  No SE. This medication should not affect her blood sugar significantly and she found she was having low blood sugars even when not using Zepbound .  Encouraged exercise, weight loss, healthy eating habits.   Orders: -     Testos,Total,Free and SHBG (Female); Future -     Estrogens, total; Future  Other fatigue Assessment & Plan: Given decreased energy levels and history of vitamin deficiency we will reevaluate with electrolytes, vitamin levels, thyroid  function, iron levels and CBC.  Orders: -     CBC with Differential/Platelet; Future -     VITAMIN D  25 Hydroxy (Vit-D Deficiency, Fractures); Future -     TSH; Future -     T4, free; Future -     T3, free; Future -     Vitamin B12; Future -     IBC + Ferritin; Future -     Magnesium; Future  Hypoglycemia Assessment & Plan: Acute, intermittent Initially her symptoms were thought to be secondary to metformin  for PCOS but she continues to have hypoglycemia spells every  morning between 8 AM to 1 PM with CBGs down less than 50. She has seen endocrinologist in the past but additional workup was not done because she was on metformin  at that time.  She is concerned about additional possible secondary causes such as adrenal insufficiency, insulinoma and insulin  autoimmune hypoglycemia. She has had some testing in the past revealing continued PCOS and midlevel cortisol levels. We will move forward with rechecking a complete metabolic panel, recheck cortisol, insulin , C-peptide, thyroid  function and prolactin.  She did have thoughts about checking an ACTH level but this is not appropriate.  Would consider ACTH stimulation test if cortisol in abnormal range.  I am not sure we can do this test at our office and she may need to return to endocrinology for full evaluation.  Since specifying  Orders: -     Comprehensive metabolic panel with GFR; Future -     Cortisol; Future -     Prolactin; Future -     Insulin  and C-Peptide; Future    The patient was advised to call back or seek an in-person evaluation if the symptoms worsen or if the condition fails to improve as anticipated.     Brittany Ring, MD

## 2023-10-31 NOTE — Assessment & Plan Note (Addendum)
 Given decreased energy levels and history of vitamin deficiency we will reevaluate with electrolytes, vitamin levels, thyroid  function, iron levels and CBC.

## 2023-10-31 NOTE — Assessment & Plan Note (Signed)
 Chronic, patient requests repeat evaluation of hormone levels including testosterone, SHBG,estrogen, prolactin FSH and LH. She also has concerns about possible congenital adrenal hyperplasia and request 21-hydroxylase and 17 hydroxyprogesterone test.

## 2023-11-05 ENCOUNTER — Telehealth: Payer: Self-pay

## 2023-11-05 ENCOUNTER — Other Ambulatory Visit (INDEPENDENT_AMBULATORY_CARE_PROVIDER_SITE_OTHER)

## 2023-11-05 DIAGNOSIS — E282 Polycystic ovarian syndrome: Secondary | ICD-10-CM

## 2023-11-05 DIAGNOSIS — E162 Hypoglycemia, unspecified: Secondary | ICD-10-CM

## 2023-11-05 DIAGNOSIS — R5383 Other fatigue: Secondary | ICD-10-CM | POA: Diagnosis not present

## 2023-11-05 LAB — CBC WITH DIFFERENTIAL/PLATELET
Basophils Absolute: 0.1 K/uL (ref 0.0–0.1)
Basophils Relative: 1.9 % (ref 0.0–3.0)
Eosinophils Absolute: 0.1 K/uL (ref 0.0–0.7)
Eosinophils Relative: 1.8 % (ref 0.0–5.0)
HCT: 36.6 % (ref 36.0–46.0)
Hemoglobin: 11.9 g/dL — ABNORMAL LOW (ref 12.0–15.0)
Lymphocytes Relative: 33.7 % (ref 12.0–46.0)
Lymphs Abs: 1.6 K/uL (ref 0.7–4.0)
MCHC: 32.6 g/dL (ref 30.0–36.0)
MCV: 86.1 fl (ref 78.0–100.0)
Monocytes Absolute: 0.3 K/uL (ref 0.1–1.0)
Monocytes Relative: 7.2 % (ref 3.0–12.0)
Neutro Abs: 2.7 K/uL (ref 1.4–7.7)
Neutrophils Relative %: 55.4 % (ref 43.0–77.0)
Platelets: 249 K/uL (ref 150.0–400.0)
RBC: 4.25 Mil/uL (ref 3.87–5.11)
RDW: 13.7 % (ref 11.5–15.5)
WBC: 4.8 K/uL (ref 4.0–10.5)

## 2023-11-05 LAB — IBC + FERRITIN
Ferritin: 19.5 ng/mL (ref 10.0–291.0)
Iron: 108 ug/dL (ref 42–145)
Saturation Ratios: 38.4 % (ref 20.0–50.0)
TIBC: 281.4 ug/dL (ref 250.0–450.0)
Transferrin: 201 mg/dL — ABNORMAL LOW (ref 212.0–360.0)

## 2023-11-05 LAB — COMPREHENSIVE METABOLIC PANEL WITH GFR
ALT: 14 U/L (ref 0–35)
AST: 19 U/L (ref 0–37)
Albumin: 4 g/dL (ref 3.5–5.2)
Alkaline Phosphatase: 42 U/L (ref 39–117)
BUN: 10 mg/dL (ref 6–23)
CO2: 30 meq/L (ref 19–32)
Calcium: 8.9 mg/dL (ref 8.4–10.5)
Chloride: 104 meq/L (ref 96–112)
Creatinine, Ser: 1.03 mg/dL (ref 0.40–1.20)
GFR: 65.32 mL/min (ref >60.00)
Glucose, Bld: 87 mg/dL (ref 70–99)
Potassium: 4.2 meq/L (ref 3.5–5.1)
Sodium: 139 meq/L (ref 135–145)
Total Bilirubin: 0.7 mg/dL (ref 0.2–1.2)
Total Protein: 6.7 g/dL (ref 6.0–8.3)

## 2023-11-05 LAB — VITAMIN D 25 HYDROXY (VIT D DEFICIENCY, FRACTURES): VITD: 118.55 ng/mL (ref 30.00–100.00)

## 2023-11-05 LAB — T4, FREE: Free T4: 0.93 ng/dL (ref 0.60–1.60)

## 2023-11-05 LAB — LUTEINIZING HORMONE: LH: 17.42 m[IU]/mL

## 2023-11-05 LAB — T3, FREE: T3, Free: 2.8 pg/mL (ref 2.3–4.2)

## 2023-11-05 LAB — TSH: TSH: 1.22 u[IU]/mL (ref 0.35–5.50)

## 2023-11-05 LAB — FOLLICLE STIMULATING HORMONE: FSH: 7 m[IU]/mL

## 2023-11-05 LAB — CORTISOL: Cortisol, Plasma: 10.5 ug/dL

## 2023-11-05 LAB — VITAMIN B12: Vitamin B-12: 471 pg/mL (ref 211–911)

## 2023-11-05 NOTE — Telephone Encounter (Signed)
 Call patient.  Let her know her vitamin D  level is very high.  This is not likely causing any symptoms but she needs to stop her vitamin D  supplement.

## 2023-11-05 NOTE — Telephone Encounter (Signed)
 Saa from [facility] lab called with critical Vit D lab of 118.55  Spoke to Dr Avelina. Sending her the message, also.

## 2023-11-06 ENCOUNTER — Ambulatory Visit: Payer: Self-pay | Admitting: Family Medicine

## 2023-11-06 LAB — INSULIN AND C-PEPTIDE, SERUM
C-Peptide: 1.9 ng/mL (ref 1.1–4.4)
INSULIN: 7.7 u[IU]/mL (ref 2.6–24.9)

## 2023-11-07 LAB — MAGNESIUM: Magnesium: 1.9 mg/dL (ref 1.5–2.5)

## 2023-11-09 ENCOUNTER — Other Ambulatory Visit: Payer: Self-pay | Admitting: Family Medicine

## 2023-11-09 DIAGNOSIS — F988 Other specified behavioral and emotional disorders with onset usually occurring in childhood and adolescence: Secondary | ICD-10-CM

## 2023-11-09 DIAGNOSIS — E162 Hypoglycemia, unspecified: Secondary | ICD-10-CM

## 2023-11-11 LAB — PROLACTIN: Prolactin: 10.3 ng/mL

## 2023-11-11 LAB — 17-HYDROXYPROGESTERONE: 17-OH-Progesterone, LC/MS/MS: 49 ng/dL

## 2023-11-11 LAB — 21-HYDROXYLASE ANTIBODIES: 21-Hydroxylase Antibodies: NEGATIVE

## 2023-11-11 LAB — TESTOS,TOTAL,FREE AND SHBG (FEMALE)
Free Testosterone: 1.9 pg/mL (ref 0.1–6.4)
Sex Hormone Binding: 74 nmol/L (ref 17–124)
Testosterone, Total, LC-MS-MS: 23 ng/dL (ref 2–45)

## 2023-11-11 LAB — ESTROGENS, TOTAL: Estrogen: 360 pg/mL

## 2023-11-11 NOTE — Telephone Encounter (Signed)
 Last office visit 10/31/23 for PCOS, Obesity, Fatigue and Hypoglycemia.  Last refilled 08/14/2023 for #60 with no refills x 3 months.  Next appt: 11/12/23

## 2023-11-12 ENCOUNTER — Telehealth: Admitting: Family Medicine

## 2023-11-12 ENCOUNTER — Encounter: Payer: Self-pay | Admitting: Family Medicine

## 2023-11-12 DIAGNOSIS — D509 Iron deficiency anemia, unspecified: Secondary | ICD-10-CM | POA: Diagnosis not present

## 2023-11-12 DIAGNOSIS — E282 Polycystic ovarian syndrome: Secondary | ICD-10-CM

## 2023-11-12 MED ORDER — METFORMIN HCL ER 500 MG PO TB24: 500.0000 mg | ORAL_TABLET | Freq: Every day | ORAL | 1 refills | Status: AC

## 2023-11-12 MED ORDER — METHYLPHENIDATE HCL 5 MG PO TABS: ORAL_TABLET | ORAL | 0 refills | Status: DC

## 2023-11-12 MED ORDER — METHYLPHENIDATE HCL ER (CD) 40 MG PO CPCR: 40.0000 mg | ORAL_CAPSULE | ORAL | 0 refills | Status: DC

## 2023-11-12 NOTE — Progress Notes (Signed)
 VIRTUAL VISIT A virtual visit is felt to be most appropriate for this patient at this time.   I connected with the patient on 12/02/23 at 11:20 AM EDT by virtual telehealth platform and verified that I am speaking with the correct person using two identifiers.   I discussed the limitations, risks, security and privacy concerns of performing an evaluation and management service by  virtual telehealth platform and the availability of in person appointments. I also discussed with the patient that there may be a patient responsible charge related to this service. The patient expressed understanding and agreed to proceed.  Patient location: Home Provider Location: [place] Participants: Brittany [name] and Brittany [name]   Chief Complaint  Patient presents with   Medical Management of Chronic Issues    Follow up after lab results    History of Present Illness:  48 y.o. female patient of Brittany Gentzler E, MD presents for PCOS eval.  PCOS  now on Zepbound  7.5 mg in last  2 months. Decreased appetite over all, full quicker.   Here to review labs in detail.  She is seeing  nutritionist.   She is having less episodes of low blood sugar ( daily 50 blood sugar only between 8 Am and 1 PM)... keeping it up with  regular diet.  Lowest she has had is 74.  Has stopped taking magnesium.     Vit D was very high.. she has stopped the supplement FSH/LH ratio: 2.4 Insulin  and C-peptide level normal Plasma cortisol 10.5.SABRA Am nml Magnesium and complete metabolic panel normal Hemoglobin slightly low at 11.9 Iron panel showed normal iron, low transferring iron, normal ferritin Vitamin B12 normal Total estrogen normal Testosterone levels normal for female Prolactin normal Negative for 21-hydroxylase autoantibodies 17 hydroxyprogesterone 49 TSH, free T3 and free T4 within normal range  No evidence of congenital adrenal hyperplasia No clear cause of fatigue As far as workup of hypoglycemia: No  evidence of insulinoma, adrenal insufficiency or insulin  autoimmune hypoglycemia BP Readings from Last 3 Encounters:  09/19/23 112/78  07/31/23 127/82  05/24/23 100/70   Wt Readings from Last 3 Encounters:  10/31/23 288 lb (130.6 kg)  09/19/23 288 lb (130.6 kg)  07/31/23 282 lb (127.9 kg)   Lab Results  Component Value Date   HGBA1C 4.9 09/09/2023       COVID 19 screen No recent travel or known exposure to COVID19 The patient denies respiratory symptoms of COVID 19 at this time.  The importance of social distancing was discussed today.   Review of Systems  Constitutional:  Negative for chills and fever.  HENT:  Negative for congestion and ear pain.   Eyes:  Negative for pain and redness.  Respiratory:  Negative for cough and shortness of breath.   Cardiovascular:  Negative for chest pain, palpitations and leg swelling.  Gastrointestinal:  Negative for abdominal pain, blood in stool, constipation, diarrhea, nausea and vomiting.  Genitourinary:  Negative for dysuria.  Musculoskeletal:  Negative for falls and myalgias.  Skin:  Negative for rash.  Neurological:  Negative for dizziness.  Psychiatric/Behavioral:  Negative for depression. The patient is not nervous/anxious.       Past Medical History:  Diagnosis Date   ADD (attention deficit disorder)    Adenomyosis    Anemia    Asthma    Celiac disease    Diverticulitis    Diverticulosis    Heart murmur    Ovarian cyst    PCOS (polycystic ovarian syndrome)  reports that she has never smoked. She has never used smokeless tobacco. She reports current alcohol use. She reports that she does not use drugs.   Current Outpatient Medications:    ACCU-CHEK GUIDE TEST test strip, USE AS INSTRUCTED FOR HYPOGLYCEMIA E16.2, Disp: 100 strip, Rfl: 3   Accu-Chek Softclix Lancets lancets, Use as instructed for hypoglycemia, Disp: 100 each, Rfl: 12   albuterol  (PROVENTIL ) (2.5 MG/3ML) 0.083% nebulizer solution, TAKE 3 MLS BY  NEBULIZATION EVERY 6 (SIX) HOURS AS NEEDED FOR WHEEZING OR SHORTNESS OF BREATH., Disp: 150 mL, Rfl: 2   albuterol  (VENTOLIN  HFA) 108 (90 Base) MCG/ACT inhaler, TAKE 2 PUFFS BY MOUTH EVERY 6 HOURS AS NEEDED FOR WHEEZE OR SHORTNESS OF BREATH, Disp: 18 each, Rfl: 2   Budesonide  (PULMICORT  FLEXHALER) 90 MCG/ACT inhaler, INHALE 1 PUFF BY MOUTH TWICE A DAY, Disp: 1 each, Rfl: 2   EPINEPHrine  0.3 mg/0.3 mL IJ SOAJ injection, TO BE ADMINISTERED BY PHARMACIST FOR IMMUNIZATION AS DIRECTED, Disp: 2 each, Rfl: 0   L-Lysine 1000 MG TABS, Take 2 tablets by mouth daily., Disp: , Rfl:    LOW-OGESTREL 0.3-30 MG-MCG tablet, Take 1 tablet by mouth daily., Disp: , Rfl:    methylphenidate  (METADATE  CD) 40 MG CR capsule, Take 1 capsule (40 mg total) by mouth every morning. Fill after 10/14/2023, Disp: 30 capsule, Rfl: 0   methylphenidate  (METADATE  CD) 40 MG CR capsule, Take 1 capsule (40 mg total) by mouth every morning., Disp: 30 capsule, Rfl: 0   methylphenidate  (RITALIN ) 5 MG tablet, Take 2 tablets in AM with Metadate  CD 30 mg and additional 2 tablet around noon  as needed for concentration later in the day., Disp: 120 tablet, Rfl: 0   methylphenidate  (RITALIN ) 5 MG tablet, Take 2 tablets in AM with Metadate  CD 30 mg and additional 2 tablet around noon  as needed for concentration later in the day., Disp: 120 tablet, Rfl: 0   montelukast  (SINGULAIR ) 10 MG tablet, TAKE 1 TABLET BY MOUTH EVERYDAY AT BEDTIME, Disp: 90 tablet, Rfl: 0   Omega-3 1000 MG CAPS, Take 1,000 mg by mouth daily., Disp: , Rfl:    QUERCETIN PO, Take 1 tablet by mouth daily., Disp: , Rfl:    Turmeric 500 MG TABS, Take by mouth. Take 2-3 tablets by mouth daily, Disp: , Rfl:    zinc gluconate 50 MG tablet, Take 25 mg by mouth daily., Disp: , Rfl:    metFORMIN  (GLUCOPHAGE -XR) 500 MG 24 hr tablet, Take 1 tablet (500 mg total) by mouth daily with breakfast., Disp: 90 tablet, Rfl: 1   methylphenidate  (METADATE  CD) 40 MG CR capsule, Take 1 capsule (40 mg  total) by mouth every morning., Disp: 30 capsule, Rfl: 0   methylphenidate  (RITALIN ) 5 MG tablet, Take 1-2 tablets around noon as needed for concentration later in the day, Disp: 60 tablet, Rfl: 0   methylphenidate  (RITALIN ) 5 MG tablet, Take 1-2 tablet around noon as needed for concentration later in the day, Disp: 60 tablet, Rfl: 0   VITAMIN D  PO, 25 mcg. Take 1/2 tablet daily (Patient not taking: Reported on 11/12/2023), Disp: , Rfl:    Observations/Objective: There were no vitals taken for this visit.  Physical Exam Constitutional:      General: The patient is not in acute distress. Pulmonary:     Effort: Pulmonary effort is normal. No respiratory distress.  Neurological:     Mental Status: The patient is alert and oriented to person, place, and time.  Psychiatric:  Mood and Affect: Mood normal.        Behavior: Behavior normal.    Assessment and Plan Severe obesity (BMI 35.0-39.9) with comorbidity [facility]) Assessment & Plan: Chronic, reviewed labs in detail with patient.  She continues to struggle with PCOS and weight management.  She feels like GLP-1 medication has not been helpful for her.  We will stop this medication and restart metformin  500 mg daily with breakfast.  I will also move forward with referral to medical weight management for assistance in weight loss.  Orders: -     Amb Ref to Medical Weight Management  Iron deficiency anemia, unspecified iron deficiency anemia type  PCOS (polycystic ovarian syndrome) -     Amb Ref to Medical Weight Management  Other orders -     metFORMIN  HCl ER; Take 1 tablet (500 mg total) by mouth daily with breakfast.  Dispense: 90 tablet; Refill: 1     The patient was advised to call back or seek an in-person evaluation if the symptoms worsen or if the condition fails to improve as anticipated.     Brittany Ring, MD

## 2023-12-02 NOTE — Assessment & Plan Note (Signed)
 Chronic, reviewed labs in detail with patient.  She continues to struggle with PCOS and weight management.  She feels like GLP-1 medication has not been helpful for her.  We will stop this medication and restart metformin  500 mg daily with breakfast.  I will also move forward with referral to medical weight management for assistance in weight loss.

## 2023-12-05 ENCOUNTER — Encounter (INDEPENDENT_AMBULATORY_CARE_PROVIDER_SITE_OTHER): Payer: Self-pay

## 2023-12-16 ENCOUNTER — Other Ambulatory Visit
Admission: RE | Admit: 2023-12-16 | Discharge: 2023-12-16 | Disposition: A | Discharge status: Home / Self Care | Attending: Pulmonary Disease | Admitting: Pulmonary Disease

## 2023-12-16 ENCOUNTER — Encounter: Payer: Self-pay | Admitting: Pulmonary Disease

## 2023-12-16 ENCOUNTER — Ambulatory Visit: Admitting: Pulmonary Disease

## 2023-12-16 VITALS — BP 100/80 | HR 83 | Temp 98.9°F | Ht 69.0 in | Wt 292.4 lb

## 2023-12-16 DIAGNOSIS — J45909 Unspecified asthma, uncomplicated: Secondary | ICD-10-CM | POA: Insufficient documentation

## 2023-12-16 DIAGNOSIS — R0602 Shortness of breath: Secondary | ICD-10-CM

## 2023-12-16 DIAGNOSIS — E282 Polycystic ovarian syndrome: Secondary | ICD-10-CM

## 2023-12-16 LAB — NITRIC OXIDE: Nitric Oxide: 15

## 2023-12-16 NOTE — Patient Instructions (Signed)
 VISIT SUMMARY:  You came in today due to difficulty breathing that has been ongoing for over a year. We discussed your history of asthma and the recent weight gain you have experienced, which may be contributing to your symptoms. We reviewed your current medications and discussed the proper use of your inhalers.  YOUR PLAN:  -ASTHMA WITH PERSISTENT SHORTNESS OF BREATH: Asthma is a condition where your airways narrow and swell, making it difficult to breathe. Your symptoms include wheezing and a dry cough, which have been persistent. We discussed how to properly use your Pulmicort  Flexhaler: take one puff twice a day and rinse your mouth after each use. If your symptoms worsen, increase to two puffs twice a day. We will also conduct pulmonary function tests, a chest x-ray, and blood work to check for allergies.  INSTRUCTIONS:  Please schedule a follow-up appointment in two months. We will call you with the test results as they become available.

## 2023-12-16 NOTE — Progress Notes (Signed)
 Subjective:    Patient ID: Brittany [name], female    DOB: 13-Mar-1976, 48 y.o.   MRN: [id number]  Patient Care Team: Avelina Greig BRAVO, MD as PCP - General Darliss Rogue, MD as PCP - Cardiology (Cardiology) [facility], [facility]  Chief Complaint  Patient presents with   Shortness of Breath    Off and on for about a year, and was supposed to get a chest x-ray but was unable to since a radiologist had moved from her PCP's clinic so they referred her to us . Has been having an infrequent dry cough and wheezing, and has been previously prescribed inhalers like albuterol  and previously Qvar  which has been discontinued but now has been prescribed Pulmicort , but needs to be shown how to use. Dx of asthma and allergies.    BACKGROUND: Patient is a 48 year old lifelong never smoker with a history as noted below who presents for evaluation of shortness of breath.  She is kindly referred by Dr. Greig Avelina.   HPI Discussed the use of AI scribe software for clinical note transcription with the patient, who gave verbal consent to proceed.  History of Present Illness   Brittany [name] is a 48 year old female with asthma who presents with difficulty breathing for over a year. She was referred by Dr. Greig Avelina for further evaluation of her breathing difficulties.  She has experienced intermittent difficulty breathing for over a year. Initially, she discussed this with her doctor in late fall or January, opting to wait a few months to see if symptoms improved. Despite increasing her medication, the symptoms persisted, prompting further evaluation. She underwent a chest x-ray and blood tests during her annual check-up in June, but the results were nonrevealing with the exception of a very high vitamin D  level.  She has a history of asthma, which was difficult to diagnose initially. Years ago, she was diagnosed with asthma after a specific test and prescribed an inhaler, which was effective. She  currently uses a portable liquid albuterol  nebulizer for emergencies and a blue albuterol  inhaler (Ventolin ) as needed. She previously used Qvar , which was effective when combined with albuterol , but has since been switched to Pulmicort  Flexhaler, which she has not used due to unfamiliarity with the device. She experiences random episodes of breathing difficulty, sometimes while driving, and has had a persistent wheeze and dry cough in the past year, noticeable to her coworkers.  She has noted some seasonal variation to her symptoms.  Notes that albuterol  does help her when she uses it.  She reports a weight gain of 50 to 60 pounds since January 2024, which she attributes to a flare-up of her PCOS. Her medications were adjusted in May 2023 to manage a cyst and reduce pain medication use, but this led to significant weight gain. She resumed metformin  in early 2024, which previously helped manage her PCOS and resulted in pregnancy. However, this time it exacerbated hypoglycemia, requiring dietary changes that contributed to further weight gain. She began a new injectable medication (GLP-1 agonist) in May 2025 but did not experience weight loss.  No recent cough, but she had a dry cough earlier this year. She reports occasional palpitations and a slight increase in blood pressure, but no significant heart issues. Her weight has been unstable, increasing significantly since early 2024.     She is a lifelong never smoker, no significant occupational exposure.  She is a Runner, broadcasting/film/video, teaches pre-k.  Review of Systems A 10 point review  of systems was performed and it is as noted above otherwise negative.   Past Medical History:  Diagnosis Date   ADD (attention deficit disorder)    Adenomyosis    Anemia    Asthma    Celiac disease    Diverticulitis    Diverticulosis    Heart murmur    Hypoglycemia    not diabetic.   Ovarian cyst    PCOS (polycystic ovarian syndrome)     Past Surgical History:   Procedure Laterality Date   NO PAST SURGERIES      Patient Active Problem List   Diagnosis Date Noted   Other fatigue 10/31/2023   Hypoglycemia 10/30/2022   Pure hypercholesterolemia 06/09/2021   LLQ pain 01/27/2021   Change in bowel habits 09/12/2020   Family history of ulcerative colitis 09/12/2020   Constipation 09/12/2020   Urinary urgency 08/06/2020   Celiac disease 05/05/2019   Abnormal cervical Papanicolaou smear 09/19/2018   Dizziness 11/02/2016   Moderate persistent asthma in adult without complication 10/24/2015   Acid reflux 04/09/2015   PCOS (polycystic ovarian syndrome) 11/26/2014   Acute pain of right knee 02/11/2014   ADD (attention deficit disorder) 09/10/2013   Iron deficiency anemia 09/10/2013   Severe obesity (BMI 35.0-39.9) with comorbidity (HCC) 11/26/2006    Family History  Problem Relation Age of Onset   Hyperlipidemia Mother    Colon polyps Mother    Ulcerative colitis Sister    Irritable bowel syndrome Sister    Celiac disease Sister    Hypertension Brother        constrolled with diet   Heart attack Paternal Grandfather    Diabetes Maternal Aunt    Colon polyps Maternal Aunt    Heart attack Maternal Aunt    Colon polyps Maternal Aunt    Cancer Paternal Aunt        ? type    Social History   Tobacco Use   Smoking status: Never   Smokeless tobacco: Never  Substance Use Topics   Alcohol use: Yes    Alcohol/week: 0.0 standard drinks of alcohol    Comment: 1 drink every 2 months     Allergies  Allergen Reactions   Flaxseed (Linseed) Nausea Only and Other (See Comments)    flaxseed extract   Gluten Meal Nausea Only and Other (See Comments)   Iodine     REACTION: Itching in throat   Shellfish Allergy     REACTION: GI issues; itching    Current Meds  Medication Sig   ACCU-CHEK GUIDE TEST test strip USE AS INSTRUCTED FOR HYPOGLYCEMIA E16.2   Accu-Chek Softclix Lancets lancets Use as instructed for hypoglycemia   albuterol   (PROVENTIL ) (2.5 MG/3ML) 0.083% nebulizer solution TAKE 3 MLS BY NEBULIZATION EVERY 6 (SIX) HOURS AS NEEDED FOR WHEEZING OR SHORTNESS OF BREATH.   albuterol  (VENTOLIN  HFA) 108 (90 Base) MCG/ACT inhaler TAKE 2 PUFFS BY MOUTH EVERY 6 HOURS AS NEEDED FOR WHEEZE OR SHORTNESS OF BREATH   azelastine (OPTIVAR) 0.05 % ophthalmic solution Apply 1 drop to eye 2 (two) times daily.   beclomethasone (QVAR ) 40 MCG/ACT inhaler Inhale 2 puffs into the lungs 2 (two) times daily.   Budesonide  (PULMICORT  FLEXHALER) 90 MCG/ACT inhaler INHALE 1 PUFF BY MOUTH TWICE A DAY   EPINEPHrine  0.3 mg/0.3 mL IJ SOAJ injection TO BE ADMINISTERED BY PHARMACIST FOR IMMUNIZATION AS DIRECTED   L-Lysine 1000 MG TABS Take 2 tablets by mouth daily.   LOW-OGESTREL 0.3-30 MG-MCG tablet Take 1 tablet by mouth  daily.   metFORMIN  (GLUCOPHAGE -XR) 500 MG 24 hr tablet Take 1 tablet (500 mg total) by mouth daily with breakfast.   methylphenidate  (METADATE  CD) 40 MG CR capsule Take 1 capsule (40 mg total) by mouth every morning. Fill after 10/14/2023   methylphenidate  (METADATE  CD) 40 MG CR capsule Take 1 capsule (40 mg total) by mouth every morning.   methylphenidate  (METADATE  CD) 40 MG CR capsule Take 1 capsule (40 mg total) by mouth every morning.   methylphenidate  (RITALIN ) 5 MG tablet Take 2 tablets in AM with Metadate  CD 30 mg and additional 2 tablet around noon  as needed for concentration later in the day.   methylphenidate  (RITALIN ) 5 MG tablet Take 2 tablets in AM with Metadate  CD 30 mg and additional 2 tablet around noon  as needed for concentration later in the day.   methylphenidate  (RITALIN ) 5 MG tablet Take 1-2 tablets around noon as needed for concentration later in the day   methylphenidate  (RITALIN ) 5 MG tablet Take 1-2 tablet around noon as needed for concentration later in the day   montelukast  (SINGULAIR ) 10 MG tablet TAKE 1 TABLET BY MOUTH EVERYDAY AT BEDTIME   Omega-3 1000 MG CAPS Take 1,000 mg by mouth daily.   QUERCETIN PO  Take 1 tablet by mouth daily.   tirzepatide  10 MG/0.5ML injection vial Inject 10 mg into the skin.   Tirzepatide -Weight Management 12.5 MG/0.5ML SOLN Inject 12.5 mg into the skin.   Tirzepatide -Weight Management 15 MG/0.5ML SOLN Inject 15 mg into the skin.   Turmeric 500 MG TABS Take by mouth. Take 2-3 tablets by mouth daily   VITAMIN D  PO 25 mcg. Take 1/2 tablet daily   zinc gluconate 50 MG tablet Take 25 mg by mouth daily.    Immunization History  Administered Date(s) Administered   Influenza Whole 01/24/2006   Influenza,inj,Quad PF,6+ Mos 03/16/2014, 05/16/2017   PFIZER Comirnaty(Gray Top)Covid-19 Tri-Sucrose Vaccine 05/19/2019, 06/09/2019, 08/09/2020   PFIZER(Purple Top)SARS-COV-2 Vaccination 03/29/2020   Pfizer Covid-19 Vaccine Bivalent Booster 45yrs & up 01/31/2021   Tdap 03/16/2014, 06/09/2021        Objective:     BP 100/80   Pulse 83   Temp 98.9 F (37.2 C)   Ht 5' 9 (1.753 m)   Wt 292 lb 6.4 oz (132.6 kg)   SpO2 100%   BMI 43.18 kg/m   SpO2: 100 %  GENERAL: Obese woman, no acute distress, very well-groomed, no conversational dyspnea.  Fully ambulatory. HEAD: Normocephalic, atraumatic.  EYES: Pupils equal, round, reactive to light.  No scleral icterus.  MOUTH: Dentition intact, oral mucosa moist.  No thrush. NECK: Supple. No thyromegaly. Trachea midline. No JVD.  No adenopathy. PULMONARY: Good air entry bilaterally.  No adventitious sounds. CARDIOVASCULAR: S1 and S2. Regular rate and rhythm.  No rubs, murmurs or gallops heard. ABDOMEN: Benign. MUSCULOSKELETAL: No joint deformity, no clubbing, no edema.  NEUROLOGIC: No overt focal deficit, no gait disturbance, speech is fluent. SKIN: Intact,warm,dry. PSYCH: Mood and behavior normal.  Lab Results  Component Value Date   NITRICOXIDE 15 12/16/2023  *No evidence of significant nitric oxide  level to suggest type II inflammation.    Assessment & Plan:     ICD-10-CM   1. Shortness of breath  R06.02 Nitric  oxide    DG Chest 2 View    Pulmonary function test    2. Persistent asthma without complication, unspecified asthma severity  J45.909 Allergen Panel (27) + IGE    Pulmonary function test  3. PCOS (polycystic ovarian syndrome)  E28.2       Orders Placed This Encounter  Procedures   DG Chest 2 View    Standing Status:   Future    Expected Date:   12/16/2023    Expiration Date:   12/15/2024    Reason for Exam (SYMPTOM  OR DIAGNOSIS REQUIRED):   shortness of breath    Is patient pregnant?:   No    Preferred imaging location?:   [facility]   Allergen Panel (27) + IGE    Standing Status:   Future    Number of Occurrences:   1    Expiration Date:   12/15/2024   Nitric oxide    Pulmonary function test    Standing Status:   Future    Expiration Date:   12/15/2024    Where should this test be performed?:   Outpatient Pulmonary    What type of PFT is being ordered?:   Full PFT   Discussion:    Asthma with persistent shortness of breath Chronic asthma with persistent shortness of breath for over a year. Asthma diagnosis confirmed with positive response to albuterol  inhaler. Symptoms include intermittent wheezing and dry cough, with random exacerbations. Weight gain and PCOS may contribute to increased inflammation and asthma symptoms. Current inhaler regimen includes albuterol  and Pulmicort , with uncertainty about proper usage of Pulmicort . - Instruct on proper use of Pulmicort  Flexhaler: one puff twice a day, rinse mouth after use. Increase to two puffs twice a day if symptoms worsen. - Order pulmonary function tests, chest x-ray, and blood work to check for allergies. - Schedule follow-up appointment in two months. - Call with test results as these become available.   Will see the patient in follow-up in 2 months time she is to contact us  prior to that time should any difficulties arise.  Advised if symptoms do not improve or worsen, to please contact office for sooner follow up  or seek emergency care.    I spent 60 minutes of dedicated to the care of this patient on the date of this encounter to include pre-visit review of records, face-to-face time with the patient discussing conditions above, post visit ordering of testing, clinical documentation with the electronic health record, making appropriate referrals as documented, and communicating necessary findings to members of the patients care team.   C. Leita Sanders, MD Advanced Bronchoscopy PCCM [facility]    *This note was dictated using voice recognition software/Dragon.  Despite best efforts to proofread, errors can occur which can change the meaning. Any transcriptional errors that result from this process are unintentional and may not be fully corrected at the time of dictation.

## 2023-12-18 LAB — ALLERGEN PANEL (27) + IGE
Alternaria Alternata IgE: <0.1 kU/L
Aspergillus Fumigatus IgE: <0.1 kU/L
Bahia Grass IgE: 0.58 kU/L — AB
Bermuda Grass IgE: <0.1 kU/L
Cat Dander IgE: <0.1 kU/L
Cedar, Mountain IgE: 0.32 kU/L — AB
Cladosporium Herbarum IgE: <0.1 kU/L
Cocklebur IgE: <0.1 kU/L
Cockroach, American IgE: 0.69 kU/L — AB
Common Silver Birch IgE: <0.1 kU/L
D Farinae IgE: 1.53 kU/L — AB
D Pteronyssinus IgE: 0.97 kU/L — AB
Dog Dander IgE: <0.1 kU/L
Elm, American IgE: <0.1 kU/L
Hickory, White IgE: <0.1 kU/L
IgE (Immunoglobulin E), Serum: 60 [IU]/mL (ref 6–495)
Johnson Grass IgE: 0.44 kU/L — AB
Kentucky Bluegrass IgE: 1.55 kU/L — AB
Maple/Box Elder IgE: <0.1 kU/L
Mucor Racemosus IgE: <0.1 kU/L
Oak, White IgE: <0.1 kU/L
Penicillium Chrysogen IgE: <0.1 kU/L
Pigweed, Rough IgE: <0.1 kU/L
Plantain, English IgE: <0.1 kU/L
Ragweed, Short IgE: <0.1 kU/L
Setomelanomma Rostrat: <0.1 kU/L
Timothy Grass IgE: 0.89 kU/L — AB
White Mulberry IgE: <0.1 kU/L

## 2023-12-19 ENCOUNTER — Ambulatory Visit: Payer: Self-pay | Admitting: Pulmonary Disease

## 2023-12-22 ENCOUNTER — Encounter: Payer: Self-pay | Admitting: Family Medicine

## 2023-12-22 DIAGNOSIS — F988 Other specified behavioral and emotional disorders with onset usually occurring in childhood and adolescence: Secondary | ICD-10-CM

## 2023-12-23 NOTE — Telephone Encounter (Deleted)
 Last officer visit 11/12/23 for Obesity, PCOS and Iron deficiency anemia.  Last refilled 11/12/2023 on both medications.  Next Appt:

## 2024-01-10 ENCOUNTER — Encounter: Payer: Self-pay | Admitting: Family Medicine

## 2024-01-10 ENCOUNTER — Telehealth: Payer: Self-pay | Admitting: Family Medicine

## 2024-01-10 DIAGNOSIS — Z0279 Encounter for issue of other medical certificate: Secondary | ICD-10-CM

## 2024-01-10 NOTE — Telephone Encounter (Signed)
 Forms for Brittany [name], [name] and  [name] placed in Dr. Sherrel office in box to complete.

## 2024-01-10 NOTE — Telephone Encounter (Signed)
 Patient dropped off ppwk for provider to complete placed I providers in box at front desk

## 2024-01-10 NOTE — Telephone Encounter (Signed)
Forms placed in Dr. Rometta Emery office in box to complete.

## 2024-01-29 ENCOUNTER — Encounter: Payer: Self-pay | Admitting: Family Medicine

## 2024-01-29 MED ORDER — METHYLPHENIDATE HCL ER (CD) 40 MG PO CPCR: 40.0000 mg | ORAL_CAPSULE | ORAL | 0 refills | Status: DC

## 2024-01-29 NOTE — Telephone Encounter (Signed)
 Requesting: Methylphendate CD 40mg    Contract: UDS: Last Visit: 10/31/23 Next Visit: None Last Refill: 11/12/23 #30 and 0RF   Please Advise

## 2024-03-10 ENCOUNTER — Ambulatory Visit: Admitting: Pulmonary Disease

## 2024-03-10 ENCOUNTER — Encounter: Payer: Self-pay | Admitting: Pulmonary Disease

## 2024-03-10 ENCOUNTER — Ambulatory Visit

## 2024-03-10 ENCOUNTER — Encounter: Payer: Self-pay | Admitting: Family Medicine

## 2024-03-10 ENCOUNTER — Telehealth: Payer: Self-pay

## 2024-03-10 VITALS — BP 130/90 | HR 83 | Temp 98.0°F | Ht 69.0 in | Wt 276.8 lb

## 2024-03-10 DIAGNOSIS — J45909 Unspecified asthma, uncomplicated: Secondary | ICD-10-CM

## 2024-03-10 DIAGNOSIS — R0602 Shortness of breath: Secondary | ICD-10-CM

## 2024-03-10 DIAGNOSIS — F988 Other specified behavioral and emotional disorders with onset usually occurring in childhood and adolescence: Secondary | ICD-10-CM

## 2024-03-10 LAB — PULMONARY FUNCTION TEST
DL/VA % pred: 121 %
DL/VA: 5.07 ml/min/mmHg/L
DLCO unc % pred: 96 %
DLCO unc: 24.31 ml/min/mmHg
FEF 25-75 Post: 4.59 L/s
FEF 25-75 Pre: 3.97 L/s
FEF2575-%Change-Post: 15 %
FEF2575-%Pred-Post: 142 %
FEF2575-%Pred-Pre: 123 %
FEV1-%Change-Post: 3 %
FEV1-%Pred-Post: 100 %
FEV1-%Pred-Pre: 97 %
FEV1-Post: 3.4 L
FEV1-Pre: 3.29 L
FEV1FVC-%Change-Post: 1 %
FEV1FVC-%Pred-Pre: 107 %
FEV6-%Change-Post: 1 %
FEV6-%Pred-Post: 93 %
FEV6-%Pred-Pre: 91 %
FEV6-Post: 3.86 L
FEV6-Pre: 3.79 L
FEV6FVC-%Pred-Post: 102 %
FEV6FVC-%Pred-Pre: 102 %
FVC-%Change-Post: 1 %
FVC-%Pred-Post: 90 %
FVC-%Pred-Pre: 89 %
FVC-Post: 3.86 L
FVC-Pre: 3.79 L
Post FEV1/FVC ratio: 88 %
Post FEV6/FVC ratio: 100 %
Pre FEV1/FVC ratio: 87 %
Pre FEV6/FVC Ratio: 100 %
RV % pred: 99 %
RV: 1.96 L
TLC % pred: 100 %
TLC: 5.83 L

## 2024-03-10 MED ORDER — METHYLPHENIDATE HCL ER (CD) 40 MG PO CPCR: 40.0000 mg | ORAL_CAPSULE | ORAL | 0 refills | Status: AC

## 2024-03-10 MED ORDER — METHYLPHENIDATE HCL 5 MG PO TABS: ORAL_TABLET | ORAL | 0 refills | Status: AC

## 2024-03-10 MED ORDER — AIRSUPRA 90-80 MCG/ACT IN AERO: 2.0000 | INHALATION_SPRAY | RESPIRATORY_TRACT | 6 refills | Status: AC | PRN

## 2024-03-10 NOTE — Patient Instructions (Signed)
 Full PFT completed today ? ?

## 2024-03-10 NOTE — Progress Notes (Unsigned)
 Subjective:    Patient ID: Brittany [name], female    DOB: 08-21-1975, 48 y.o.   MRN: [id number]  Patient Care Team: Avelina Greig BRAVO, MD as PCP - General Darliss Rogue, MD as PCP - Cardiology (Cardiology) [facility], [facility]  Chief Complaint  Patient presents with   Shortness of Breath    Shortness of breath on exertion.     BACKGROUND/INTERVAL:Patient is a 48 year old lifelong never smoker with a history as noted below who presents for follow-up of shortness of breath.  She was initially seen here on 16 December 2023.  She carries prior history of asthma.  She had PFTs today.  HPI Discussed the use of AI scribe software for clinical note transcription with the patient, who gave verbal consent to proceed.  History of Present Illness   Brittany [name] is a 48 year old female with asthma who presents for a follow-up visit.  She is not consistently using her Qvar  inhaler as she is out of it and primarily relies on her albuterol  inhaler when experiencing dyspnea. She started using a new inhaler, described as a 'white thing,' (Pulmicort  Flexhaler) but stopped recently due to uncertainty about its proper use.  She experiences shortness of breath, often associated with the onset of a cold. Within a couple of days of feeling short of breath, she often feels like she is getting sick, although she does not always present with typical cold symptoms initially. She suspects a correlation between her shortness of breath and feeling unwell, possibly related to allergies.  Her current medication regimen includes the use of albuterol  as needed. She has not been using her Qvar  inhaler due to being out of it and has not been using the new inhaler due to uncertainty about its use.  She relies on albuterol  as needed.    DATA 11/05/2023 CBC with differential: No eosinophilia noted, benign. 12/16/2023 allergen panel/IgE: IgE 60 IU/mL, patient did not show sensitivities to dust mites grasses  roach and mountain cedar. 03/10/2024 PFTs: FEV1 3.29 L or 97% predicted, FVC 3.79 L or 89% predicted, FEV1/FVC 87%, no significant bronchodilator response.  Lung volumes normal with the exception of ERV low at 31% consistent with obesity.  Diffusion capacity normal.  In essence, normal study.  Review of Systems A 10 point review of systems was performed and it is as noted above otherwise negative.   Patient Active Problem List   Diagnosis Date Noted   Other fatigue 10/31/2023   Hypoglycemia 10/30/2022   Pure hypercholesterolemia 06/09/2021   LLQ pain 01/27/2021   Change in bowel habits 09/12/2020   Family history of ulcerative colitis 09/12/2020   Constipation 09/12/2020   Urinary urgency 08/06/2020   Celiac disease 05/05/2019   Abnormal cervical Papanicolaou smear 09/19/2018   Dizziness 11/02/2016   Moderate persistent asthma in adult without complication 10/24/2015   Acid reflux 04/09/2015   PCOS (polycystic ovarian syndrome) 11/26/2014   Acute pain of right knee 02/11/2014   ADD (attention deficit disorder) 09/10/2013   Iron deficiency anemia 09/10/2013   Severe obesity (BMI 35.0-39.9) with comorbidity (HCC) 11/26/2006    Social History   Tobacco Use   Smoking status: Never   Smokeless tobacco: Never  Substance Use Topics   Alcohol use: Yes    Alcohol/week: 0.0 standard drinks of alcohol    Comment: 1 drink every 2 months     Allergies[1]  Active Medications[2]  Immunization History  Administered Date(s) Administered   Influenza Whole 01/24/2006  Influenza,inj,Quad PF,6+ Mos 03/16/2014, 05/16/2017   PFIZER Comirnaty(Gray Top)Covid-19 Tri-Sucrose Vaccine 05/19/2019, 06/09/2019, 08/09/2020   PFIZER(Purple Top)SARS-COV-2 Vaccination 03/29/2020   Pfizer Covid-19 Vaccine Bivalent Booster 33yrs & up 01/31/2021   Tdap 03/16/2014, 06/09/2021        Objective:     Vitals:   03/10/24 1527  BP: (!) 130/90  Pulse: 83  Temp: 98 F (36.7 C)  Height: 5' 9 (1.753  m)  Weight: 276 lb 12.8 oz (125.6 kg)  SpO2: 99%  TempSrc: Temporal  BMI (Calculated): 40.86     SpO2: 99 %  GENERAL: Obese woman, no acute distress, very well-groomed, no conversational dyspnea.  Fully ambulatory. HEAD: Normocephalic, atraumatic.  EYES: Pupils equal, round, reactive to light.  No scleral icterus.  MOUTH: Dentition intact, oral mucosa moist.  No thrush. NECK: Supple. No thyromegaly. Trachea midline. No JVD.  No adenopathy. PULMONARY: Good air entry bilaterally.  No adventitious sounds. CARDIOVASCULAR: S1 and S2. Regular rate and rhythm.  No rubs, murmurs or gallops heard. ABDOMEN: Benign. MUSCULOSKELETAL: No joint deformity, no clubbing, no edema.  NEUROLOGIC: No overt focal deficit, no gait disturbance, speech is fluent. SKIN: Intact,warm,dry. PSYCH: Mood and behavior normal.    Assessment & Plan:     ICD-10-CM   1. Shortness of breath  R06.02    Multifactorial Asthma/obesity/deconditioning    2. Mild persistent asthma without complication  J45.30     3. PCOS (polycystic ovarian syndrome)  E28.2      Meds ordered this encounter  Medications   Albuterol -Budesonide  (AIRSUPRA ) 90-80 MCG/ACT AERO    Sig: Inhale 2 Inhalations into the lungs every 4 (four) hours as needed (Wheezing, shortness of breath).    Dispense:  10.7 g    Refill:  6   Discussion:    Mild persistent asthma without complication, patient endorses good control Well-controlled with good lung function. She is not consistently using Qvar  and has discontinued the Flexhaler due to difficulty with its use. Shortness of breath may be related to multifactorial issues such as asthma, obesity, deconditioning etc. - Prescribed Airsupra , a combination inhaler containing albuterol  and budesonide , to be used as needed. - Provided instructions and videos on the use of the Flexhaler. - Advised to use the rescue inhaler during episodes of shortness of breath. - Start Flexhaler as maintenance if symptoms  worsen - Scheduled follow-up in four months.  Allergic rhinitis Symptoms of shortness of breath and feeling of sickness may be related to allergic reactions rather than asthma exacerbation. - Advised to use the rescue inhaler during episodes of shortness of breath.      Advised if symptoms do not improve or worsen, to please contact office for sooner follow up or seek emergency care.    I spent 32 minutes of dedicated to the care of this patient on the date of this encounter to include pre-visit review of records, face-to-face time with the patient discussing conditions above, post visit ordering of testing, clinical documentation with the electronic health record, making appropriate referrals as documented, and communicating necessary findings to members of the patients care team.     C. Leita Sanders, MD Advanced Bronchoscopy PCCM [facility]    *This note was generated using voice recognition software/Dragon and/or AI transcription program.  Despite best efforts to proofread, errors can occur which can change the meaning. Any transcriptional errors that result from this process are unintentional and may not be fully corrected at the time of dictation.     [1]  Allergies Allergen  Reactions   Flaxseed (Linseed) Nausea Only and Other (See Comments)    flaxseed extract   Gluten Meal Nausea Only and Other (See Comments)   Iodine     REACTION: Itching in throat   Shellfish Allergy     REACTION: GI issues; itching  [2]  Current Meds  Medication Sig   ACCU-CHEK GUIDE TEST test strip USE AS INSTRUCTED FOR HYPOGLYCEMIA E16.2   Accu-Chek Softclix Lancets lancets Use as instructed for hypoglycemia   albuterol  (PROVENTIL ) (2.5 MG/3ML) 0.083% nebulizer solution TAKE 3 MLS BY NEBULIZATION EVERY 6 (SIX) HOURS AS NEEDED FOR WHEEZING OR SHORTNESS OF BREATH.   Albuterol -Budesonide  (AIRSUPRA ) 90-80 MCG/ACT AERO Inhale 2 Inhalations into the lungs every 4 (four) hours as  needed (Wheezing, shortness of breath).   azelastine (OPTIVAR) 0.05 % ophthalmic solution Apply 1 drop to eye 2 (two) times daily.   beclomethasone (QVAR ) 40 MCG/ACT inhaler Inhale 2 puffs into the lungs 2 (two) times daily. (Patient taking differently: Inhale 2 puffs into the lungs 2 (two) times daily. PRN)   EPINEPHrine  0.3 mg/0.3 mL IJ SOAJ injection TO BE ADMINISTERED BY PHARMACIST FOR IMMUNIZATION AS DIRECTED   L-Lysine 1000 MG TABS Take 2 tablets by mouth daily.   LOW-OGESTREL 0.3-30 MG-MCG tablet Take 1 tablet by mouth daily.   metFORMIN  (GLUCOPHAGE -XR) 500 MG 24 hr tablet Take 1 tablet (500 mg total) by mouth daily with breakfast.   methylphenidate  (METADATE  CD) 40 MG CR capsule Take 1 capsule (40 mg total) by mouth every morning. DO NOT FILL UNTIL 05/11/2024   methylphenidate  (RITALIN ) 5 MG tablet Take 1-2 tablet around noon as needed for concentration later in the day   montelukast  (SINGULAIR ) 10 MG tablet TAKE 1 TABLET BY MOUTH EVERYDAY AT BEDTIME   Omega-3 1000 MG CAPS Take 1,000 mg by mouth daily.   QUERCETIN PO Take 1 tablet by mouth daily.   Tirzepatide -Weight Management 15 MG/0.5ML SOLN Inject 15 mg into the skin.   Turmeric 500 MG TABS Take by mouth. Take 2-3 tablets by mouth daily   VITAMIN D  PO 25 mcg. Take 1/2 tablet daily   zinc gluconate 50 MG tablet Take 25 mg by mouth daily.   [DISCONTINUED] albuterol  (VENTOLIN  HFA) 108 (90 Base) MCG/ACT inhaler TAKE 2 PUFFS BY MOUTH EVERY 6 HOURS AS NEEDED FOR WHEEZE OR SHORTNESS OF BREATH

## 2024-03-10 NOTE — Progress Notes (Signed)
 Full PFT completed today ? ?

## 2024-03-10 NOTE — Telephone Encounter (Signed)
 Last office visit 11/12/2023 for obesity, pcos and anemia.  Last refilled Ritalin  5 mg 11/12/2023 for #60 with no refills.  Metadate  40 mg 01/29/2024 for #30 with no refills.  Next Appt: No future appointments with PCP.

## 2024-03-10 NOTE — Patient Instructions (Signed)
 VISIT SUMMARY:  Brittany [name], you had a follow-up visit today to discuss your asthma and related symptoms. You mentioned not consistently using your Qvar  inhaler and relying on your albuterol  inhaler when experiencing shortness of breath. You also stopped using a new inhaler due to uncertainty about its proper use. Your symptoms often coincide with the onset of a cold, and you suspect a correlation with allergies.  YOUR PLAN:  -ASTHMA: Asthma is a condition where your airways narrow and swell, producing extra mucus, which can make breathing difficult. Your asthma is well-controlled, but you have not been using your Qvar  inhaler consistently and stopped using the Flexhaler due to difficulty. You have been prescribed Airsupra , a combination inhaler containing albuterol  and budesonide , to be used as needed. Use your Airsupra  inhaler as you would have the rescue inhaler during episodes of shortness of breath.  -ALLERGIC RHINITIS: Allergic rhinitis is an allergic reaction that causes sneezing, congestion, and a runny nose. Your symptoms of shortness of breath and feeling unwell may be related to allergic reactions rather than asthma exacerbation. Use your Airsupra  inhaler during episodes of shortness of breath.  INSTRUCTIONS:  Please schedule a follow-up appointment in four months.

## 2024-03-11 ENCOUNTER — Encounter: Payer: Self-pay | Admitting: Pulmonary Disease

## 2024-03-11 NOTE — Telephone Encounter (Signed)
Opened in ERROR. sd 

## 2024-04-09 ENCOUNTER — Encounter: Payer: Self-pay | Admitting: Family Medicine

## 2024-04-09 MED ORDER — MONTELUKAST SODIUM 10 MG PO TABS: 10.0000 mg | ORAL_TABLET | Freq: Every day | ORAL | 1 refills | Status: AC

## 2024-07-13 ENCOUNTER — Ambulatory Visit: Admitting: Pulmonary Disease
